# Patient Record
Sex: Female | Born: 1950
Health system: Southern US, Community
[De-identification: ages and names within clinical notes are randomized; demographics above are authoritative.]

## PROBLEM LIST (undated history)

## (undated) DIAGNOSIS — N289 Disorder of kidney and ureter, unspecified: Secondary | ICD-10-CM

## (undated) DIAGNOSIS — N184 Chronic kidney disease, stage 4 (severe): Secondary | ICD-10-CM

## (undated) DIAGNOSIS — H269 Unspecified cataract: Secondary | ICD-10-CM

## (undated) DIAGNOSIS — T7840XA Allergy, unspecified, initial encounter: Secondary | ICD-10-CM

## (undated) DIAGNOSIS — Z8619 Personal history of other infectious and parasitic diseases: Secondary | ICD-10-CM

## (undated) DIAGNOSIS — E039 Hypothyroidism, unspecified: Secondary | ICD-10-CM

## (undated) DIAGNOSIS — Z923 Personal history of irradiation: Secondary | ICD-10-CM

## (undated) DIAGNOSIS — Z862 Personal history of diseases of the blood and blood-forming organs and certain disorders involving the immune mechanism: Secondary | ICD-10-CM

## (undated) DIAGNOSIS — J189 Pneumonia, unspecified organism: Secondary | ICD-10-CM

## (undated) DIAGNOSIS — N6019 Diffuse cystic mastopathy of unspecified breast: Secondary | ICD-10-CM

## (undated) DIAGNOSIS — Z9889 Other specified postprocedural states: Secondary | ICD-10-CM

## (undated) DIAGNOSIS — E049 Nontoxic goiter, unspecified: Secondary | ICD-10-CM

## (undated) DIAGNOSIS — E559 Vitamin D deficiency, unspecified: Secondary | ICD-10-CM

## (undated) DIAGNOSIS — C50919 Malignant neoplasm of unspecified site of unspecified female breast: Secondary | ICD-10-CM

## (undated) DIAGNOSIS — E663 Overweight: Secondary | ICD-10-CM

## (undated) DIAGNOSIS — D0510 Intraductal carcinoma in situ of unspecified breast: Secondary | ICD-10-CM

## (undated) DIAGNOSIS — C801 Malignant (primary) neoplasm, unspecified: Secondary | ICD-10-CM

## (undated) DIAGNOSIS — E782 Mixed hyperlipidemia: Principal | ICD-10-CM

## (undated) HISTORY — DX: Personal history of diseases of the blood and blood-forming organs and certain disorders involving the immune mechanism: Z86.2

## (undated) HISTORY — DX: Personal history of other infectious and parasitic diseases: Z86.19

## (undated) HISTORY — DX: Malignant (primary) neoplasm, unspecified: C80.1

## (undated) HISTORY — PX: EYE SURGERY: SHX253

## (undated) HISTORY — DX: Pneumonia, unspecified organism: J18.9

## (undated) HISTORY — DX: Diffuse cystic mastopathy of unspecified breast: N60.19

## (undated) HISTORY — DX: Vitamin D deficiency, unspecified: E55.9

## (undated) HISTORY — DX: Mixed hyperlipidemia: E78.2

## (undated) HISTORY — DX: Intraductal carcinoma in situ of unspecified breast: D05.10

## (undated) HISTORY — DX: Allergy, unspecified, initial encounter: T78.40XA

## (undated) HISTORY — PX: LAPAROSCOPIC GASTRIC SLEEVE RESECTION: SHX5895

## (undated) HISTORY — DX: Hypothyroidism, unspecified: E03.9

## (undated) HISTORY — PX: BREAST LUMPECTOMY: SHX2

## (undated) HISTORY — DX: Overweight: E66.3

## (undated) HISTORY — PX: CATARACT EXTRACTION: SUR2

## (undated) HISTORY — DX: Nontoxic goiter, unspecified: E04.9

## (undated) HISTORY — DX: Disorder of kidney and ureter, unspecified: N28.9

## (undated) HISTORY — DX: Other specified postprocedural states: Z98.890

## (undated) HISTORY — DX: Unspecified cataract: H26.9

---

## 1996-08-28 LAB — HM DEXA SCAN: HM DEXA SCAN: NORMAL

## 2010-08-28 DIAGNOSIS — J189 Pneumonia, unspecified organism: Secondary | ICD-10-CM

## 2010-08-28 HISTORY — DX: Pneumonia, unspecified organism: J18.9

## 2012-06-06 LAB — BASIC METABOLIC PANEL
BUN: 33 mg/dL — AB (ref 4–21)
Creatinine: 1.9 mg/dL — AB (ref 0.5–1.1)

## 2013-10-26 DIAGNOSIS — D0510 Intraductal carcinoma in situ of unspecified breast: Secondary | ICD-10-CM | POA: Insufficient documentation

## 2013-10-26 HISTORY — DX: Intraductal carcinoma in situ of unspecified breast: D05.10

## 2014-11-27 LAB — HM PAP SMEAR: HM PAP: NORMAL

## 2014-11-27 LAB — HM MAMMOGRAPHY: HM Mammogram: NORMAL

## 2015-03-22 ENCOUNTER — Encounter: Payer: Self-pay | Admitting: Behavioral Health

## 2015-03-22 ENCOUNTER — Telehealth: Payer: Self-pay | Admitting: Behavioral Health

## 2015-03-22 NOTE — Telephone Encounter (Signed)
Pre-Visit Call completed with patient and chart updated.   Pre-Visit Info documented in Specialty Comments under [employer].    

## 2015-03-23 ENCOUNTER — Encounter: Payer: Self-pay | Admitting: Family Medicine

## 2015-03-23 ENCOUNTER — Ambulatory Visit (INDEPENDENT_AMBULATORY_CARE_PROVIDER_SITE_OTHER): Payer: PRIVATE HEALTH INSURANCE | Admitting: Family Medicine

## 2015-03-23 VITALS — BP 108/62 | HR 61 | Temp 98.4°F | Ht 67.0 in | Wt 192.5 lb

## 2015-03-23 DIAGNOSIS — E782 Mixed hyperlipidemia: Secondary | ICD-10-CM | POA: Diagnosis not present

## 2015-03-23 DIAGNOSIS — N289 Disorder of kidney and ureter, unspecified: Secondary | ICD-10-CM

## 2015-03-23 DIAGNOSIS — E039 Hypothyroidism, unspecified: Secondary | ICD-10-CM

## 2015-03-23 DIAGNOSIS — T7840XA Allergy, unspecified, initial encounter: Secondary | ICD-10-CM | POA: Diagnosis not present

## 2015-03-23 DIAGNOSIS — E669 Obesity, unspecified: Secondary | ICD-10-CM | POA: Insufficient documentation

## 2015-03-23 DIAGNOSIS — Z862 Personal history of diseases of the blood and blood-forming organs and certain disorders involving the immune mechanism: Secondary | ICD-10-CM

## 2015-03-23 DIAGNOSIS — Z23 Encounter for immunization: Secondary | ICD-10-CM | POA: Diagnosis not present

## 2015-03-23 DIAGNOSIS — Z8619 Personal history of other infectious and parasitic diseases: Secondary | ICD-10-CM

## 2015-03-23 DIAGNOSIS — Z9889 Other specified postprocedural states: Secondary | ICD-10-CM

## 2015-03-23 DIAGNOSIS — E663 Overweight: Secondary | ICD-10-CM

## 2015-03-23 DIAGNOSIS — D0511 Intraductal carcinoma in situ of right breast: Secondary | ICD-10-CM

## 2015-03-23 HISTORY — DX: Personal history of other infectious and parasitic diseases: Z86.19

## 2015-03-23 HISTORY — DX: Personal history of diseases of the blood and blood-forming organs and certain disorders involving the immune mechanism: Z86.2

## 2015-03-23 HISTORY — DX: Disorder of kidney and ureter, unspecified: N28.9

## 2015-03-23 HISTORY — DX: Other specified postprocedural states: Z98.890

## 2015-03-23 HISTORY — DX: Allergy, unspecified, initial encounter: T78.40XA

## 2015-03-23 HISTORY — DX: Overweight: E66.3

## 2015-03-23 HISTORY — DX: Hypothyroidism, unspecified: E03.9

## 2015-03-23 HISTORY — DX: Mixed hyperlipidemia: E78.2

## 2015-03-23 LAB — COMPREHENSIVE METABOLIC PANEL
ALK PHOS: 72 U/L (ref 39–117)
ALT: 22 U/L (ref 0–35)
AST: 26 U/L (ref 0–37)
Albumin: 3.9 g/dL (ref 3.5–5.2)
BILIRUBIN TOTAL: 0.7 mg/dL (ref 0.2–1.2)
BUN: 36 mg/dL — ABNORMAL HIGH (ref 6–23)
CALCIUM: 9.6 mg/dL (ref 8.4–10.5)
CO2: 27 meq/L (ref 19–32)
Chloride: 108 mEq/L (ref 96–112)
Creatinine, Ser: 1.46 mg/dL — ABNORMAL HIGH (ref 0.40–1.20)
GFR: 46.53 mL/min — AB (ref >60.00)
Glucose, Bld: 89 mg/dL (ref 70–99)
Potassium: 4.2 mEq/L (ref 3.5–5.1)
Sodium: 140 mEq/L (ref 135–145)
TOTAL PROTEIN: 7.8 g/dL (ref 6.0–8.3)

## 2015-03-23 LAB — CBC
HCT: 37.5 % (ref 36.0–46.0)
HEMOGLOBIN: 12.3 g/dL (ref 12.0–15.0)
MCHC: 32.8 g/dL (ref 30.0–36.0)
MCV: 85 fl (ref 78.0–100.0)
Platelets: 166 10*3/uL (ref 150.0–400.0)
RBC: 4.42 Mil/uL (ref 3.87–5.11)
RDW: 14.1 % (ref 11.5–15.5)
WBC: 3.6 10*3/uL — AB (ref 4.0–10.5)

## 2015-03-23 LAB — LIPID PANEL
CHOLESTEROL: 232 mg/dL — AB (ref 0–200)
HDL: 108.4 mg/dL (ref >39.00)
LDL CALC: 117 mg/dL — AB (ref 0–99)
NonHDL: 123.6
TRIGLYCERIDES: 32 mg/dL (ref 0.0–149.0)
Total CHOL/HDL Ratio: 2
VLDL: 6.4 mg/dL (ref 0.0–40.0)

## 2015-03-23 LAB — TSH: TSH: 0.16 u[IU]/mL — AB (ref 0.35–4.50)

## 2015-03-23 NOTE — Assessment & Plan Note (Signed)
Had a bad episode of pneumonia in 2013 and developed kidney dysfunction at that time

## 2015-03-23 NOTE — Assessment & Plan Note (Signed)
Seasonal, worse in childhood. No trouble with allergies in [place] thus far.

## 2015-03-23 NOTE — Assessment & Plan Note (Signed)
Encouraged heart healthy diet, increase exercise, avoid trans fats, consider a krill oil cap daily 

## 2015-03-23 NOTE — Assessment & Plan Note (Signed)
Given Zostavax today

## 2015-03-23 NOTE — Assessment & Plan Note (Signed)
On Levothyroxine, continue to monitor. Check labs today

## 2015-03-23 NOTE — Assessment & Plan Note (Signed)
Encouraged DASH diet, decrease po intake and increase exercise as tolerated. Needs 7-8 hours of sleep nightly. Avoid trans fats, eat small, frequent meals every 4-5 hours with lean proteins, complex carbs and healthy fats. Minimize simple carbs 

## 2015-03-23 NOTE — Patient Instructions (Signed)
Dr Lorenda Cahill, DDS  Preventive Care for Adults A healthy lifestyle and preventive care can promote health and wellness. Preventive health guidelines for women include the following key practices.  A routine yearly physical is a good way to check with your health care provider about your health and preventive screening. It is a chance to share any concerns and updates on your health and to receive a thorough exam.  Visit your dentist for a routine exam and preventive care every 6 months. Brush your teeth twice a day and floss once a day. Good oral hygiene prevents tooth decay and gum disease.  The frequency of eye exams is based on your age, health, family medical history, use of contact lenses, and other factors. Follow your health care provider's recommendations for frequency of eye exams.  Eat a healthy diet. Foods like vegetables, fruits, whole grains, low-fat dairy products, and lean protein foods contain the nutrients you need without too many calories. Decrease your intake of foods high in solid fats, added sugars, and salt. Eat the right amount of calories for you.Get information about a proper diet from your health care provider, if necessary.  Regular physical exercise is one of the most important things you can do for your health. Most adults should get at least 150 minutes of moderate-intensity exercise (any activity that increases your heart rate and causes you to sweat) each week. In addition, most adults need muscle-strengthening exercises on 2 or more days a week.  Maintain a healthy weight. The body mass index (BMI) is a screening tool to identify possible weight problems. It provides an estimate of body fat based on height and weight. Your health care provider can find your BMI and can help you achieve or maintain a healthy weight.For adults 20 years and older:  A BMI below 18.5 is considered underweight.  A BMI of 18.5 to 24.9 is normal.  A BMI of 25 to 29.9 is considered  overweight.  A BMI of 30 and above is considered obese.  Maintain normal blood lipids and cholesterol levels by exercising and minimizing your intake of saturated fat. Eat a balanced diet with plenty of fruit and vegetables. Blood tests for lipids and cholesterol should begin at age 1 and be repeated every 5 years. If your lipid or cholesterol levels are high, you are over 50, or you are at high risk for heart disease, you may need your cholesterol levels checked more frequently.Ongoing high lipid and cholesterol levels should be treated with medicines if diet and exercise are not working.  If you smoke, find out from your health care provider how to quit. If you do not use tobacco, do not start.  Lung cancer screening is recommended for adults aged 16-80 years who are at high risk for developing lung cancer because of a history of smoking. A yearly low-dose CT scan of the lungs is recommended for people who have at least a 30-pack-year history of smoking and are a current smoker or have quit within the past 15 years. A pack year of smoking is smoking an average of 1 pack of cigarettes a day for 1 year (for example: 1 pack a day for 30 years or 2 packs a day for 15 years). Yearly screening should continue until the smoker has stopped smoking for at least 15 years. Yearly screening should be stopped for people who develop a health problem that would prevent them from having lung cancer treatment.  If you are pregnant, do not drink  alcohol. If you are breastfeeding, be very cautious about drinking alcohol. If you are not pregnant and choose to drink alcohol, do not have more than 1 drink per day. One drink is considered to be 12 ounces (355 mL) of beer, 5 ounces (148 mL) of wine, or 1.5 ounces (44 mL) of liquor.  Avoid use of street drugs. Do not share needles with anyone. Ask for help if you need support or instructions about stopping the use of drugs.  High blood pressure causes heart disease and  increases the risk of stroke. Your blood pressure should be checked at least every 1 to 2 years. Ongoing high blood pressure should be treated with medicines if weight loss and exercise do not work.  If you are 18-29 years old, ask your health care provider if you should take aspirin to prevent strokes.  Diabetes screening involves taking a blood sample to check your fasting blood sugar level. This should be done once every 3 years, after age 76, if you are within normal weight and without risk factors for diabetes. Testing should be considered at a younger age or be carried out more frequently if you are overweight and have at least 1 risk factor for diabetes.  Breast cancer screening is essential preventive care for women. You should practice "breast self-awareness." This means understanding the normal appearance and feel of your breasts and may include breast self-examination. Any changes detected, no matter how small, should be reported to a health care provider. Women in their 65s and 30s should have a clinical breast exam (CBE) by a health care provider as part of a regular health exam every 1 to 3 years. After age 69, women should have a CBE every year. Starting at age 50, women should consider having a mammogram (breast X-ray test) every year. Women who have a family history of breast cancer should talk to their health care provider about genetic screening. Women at a high risk of breast cancer should talk to their health care providers about having an MRI and a mammogram every year.  Breast cancer gene (BRCA)-related cancer risk assessment is recommended for women who have family members with BRCA-related cancers. BRCA-related cancers include breast, ovarian, tubal, and peritoneal cancers. Having family members with these cancers may be associated with an increased risk for harmful changes (mutations) in the breast cancer genes BRCA1 and BRCA2. Results of the assessment will determine the need for  genetic counseling and BRCA1 and BRCA2 testing.  Routine pelvic exams to screen for cancer are no longer recommended for nonpregnant women who are considered low risk for cancer of the pelvic organs (ovaries, uterus, and vagina) and who do not have symptoms. Ask your health care provider if a screening pelvic exam is right for you.  If you have had past treatment for cervical cancer or a condition that could lead to cancer, you need Pap tests and screening for cancer for at least 20 years after your treatment. If Pap tests have been discontinued, your risk factors (such as having a new sexual partner) need to be reassessed to determine if screening should be resumed. Some women have medical problems that increase the chance of getting cervical cancer. In these cases, your health care provider may recommend more frequent screening and Pap tests.  The HPV test is an additional test that may be used for cervical cancer screening. The HPV test looks for the virus that can cause the cell changes on the cervix. The cells collected during  the Pap test can be tested for HPV. The HPV test could be used to screen women aged 29 years and older, and should be used in women of any age who have unclear Pap test results. After the age of 54, women should have HPV testing at the same frequency as a Pap test.  Colorectal cancer can be detected and often prevented. Most routine colorectal cancer screening begins at the age of 91 years and continues through age 65 years. However, your health care provider may recommend screening at an earlier age if you have risk factors for colon cancer. On a yearly basis, your health care provider may provide home test kits to check for hidden blood in the stool. Use of a small camera at the end of a tube, to directly examine the colon (sigmoidoscopy or colonoscopy), can detect the earliest forms of colorectal cancer. Talk to your health care provider about this at age 58, when routine  screening begins. Direct exam of the colon should be repeated every 5-10 years through age 61 years, unless early forms of pre-cancerous polyps or small growths are found.  People who are at an increased risk for hepatitis B should be screened for this virus. You are considered at high risk for hepatitis B if:  You were born in a country where hepatitis B occurs often. Talk with your health care provider about which countries are considered high risk.  Your parents were born in a high-risk country and you have not received a shot to protect against hepatitis B (hepatitis B vaccine).  You have HIV or AIDS.  You use needles to inject street drugs.  You live with, or have sex with, someone who has hepatitis B.  You get hemodialysis treatment.  You take certain medicines for conditions like cancer, organ transplantation, and autoimmune conditions.  Hepatitis C blood testing is recommended for all people born from [birth year] through 1965 and any individual with known risks for hepatitis C.  Practice safe sex. Use condoms and avoid high-risk sexual practices to reduce the spread of sexually transmitted infections (STIs). STIs include gonorrhea, chlamydia, syphilis, trichomonas, herpes, HPV, and human immunodeficiency virus (HIV). Herpes, HIV, and HPV are viral illnesses that have no cure. They can result in disability, cancer, and death.  You should be screened for sexually transmitted illnesses (STIs) including gonorrhea and chlamydia if:  You are sexually active and are younger than 24 years.  You are older than 24 years and your health care provider tells you that you are at risk for this type of infection.  Your sexual activity has changed since you were last screened and you are at an increased risk for chlamydia or gonorrhea. Ask your health care provider if you are at risk.  If you are at risk of being infected with HIV, it is recommended that you take a prescription medicine daily to  prevent HIV infection. This is called preexposure prophylaxis (PrEP). You are considered at risk if:  You are a heterosexual woman, are sexually active, and are at increased risk for HIV infection.  You take drugs by injection.  You are sexually active with a partner who has HIV.  Talk with your health care provider about whether you are at high risk of being infected with HIV. If you choose to begin PrEP, you should first be tested for HIV. You should then be tested every 3 months for as long as you are taking PrEP.  Osteoporosis is a disease in which the  bones lose minerals and strength with aging. This can result in serious bone fractures or breaks. The risk of osteoporosis can be identified using a bone density scan. Women ages 29 years and over and women at risk for fractures or osteoporosis should discuss screening with their health care providers. Ask your health care provider whether you should take a calcium supplement or vitamin D to reduce the rate of osteoporosis.  Menopause can be associated with physical symptoms and risks. Hormone replacement therapy is available to decrease symptoms and risks. You should talk to your health care provider about whether hormone replacement therapy is right for you.  Use sunscreen. Apply sunscreen liberally and repeatedly throughout the day. You should seek shade when your shadow is shorter than you. Protect yourself by wearing long sleeves, pants, a wide-brimmed hat, and sunglasses year round, whenever you are outdoors.  Once a month, do a whole body skin exam, using a mirror to look at the skin on your back. Tell your health care provider of new moles, moles that have irregular borders, moles that are larger than a pencil eraser, or moles that have changed in shape or color.  Stay current with required vaccines (immunizations).  Influenza vaccine. All adults should be immunized every year.  Tetanus, diphtheria, and acellular pertussis (Td, Tdap)  vaccine. Pregnant women should receive 1 dose of Tdap vaccine during each pregnancy. The dose should be obtained regardless of the length of time since the last dose. Immunization is preferred during the 27th-36th week of gestation. An adult who has not previously received Tdap or who does not know her vaccine status should receive 1 dose of Tdap. This initial dose should be followed by tetanus and diphtheria toxoids (Td) booster doses every 10 years. Adults with an unknown or incomplete history of completing a 3-dose immunization series with Td-containing vaccines should begin or complete a primary immunization series including a Tdap dose. Adults should receive a Td booster every 10 years.  Varicella vaccine. An adult without evidence of immunity to varicella should receive 2 doses or a second dose if she has previously received 1 dose. Pregnant females who do not have evidence of immunity should receive the first dose after pregnancy. This first dose should be obtained before leaving the health care facility. The second dose should be obtained 4-8 weeks after the first dose.  Human papillomavirus (HPV) vaccine. Females aged 13-26 years who have not received the vaccine previously should obtain the 3-dose series. The vaccine is not recommended for use in pregnant females. However, pregnancy testing is not needed before receiving a dose. If a female is found to be pregnant after receiving a dose, no treatment is needed. In that case, the remaining doses should be delayed until after the pregnancy. Immunization is recommended for any person with an immunocompromised condition through the age of 68 years if she did not get any or all doses earlier. During the 3-dose series, the second dose should be obtained 4-8 weeks after the first dose. The third dose should be obtained 24 weeks after the first dose and 16 weeks after the second dose.  Zoster vaccine. One dose is recommended for adults aged 54 years or older  unless certain conditions are present.  Measles, mumps, and rubella (MMR) vaccine. Adults born before 1 generally are considered immune to measles and mumps. Adults born in [birth year] or later should have 1 or more doses of MMR vaccine unless there is a contraindication to the vaccine or there is  laboratory evidence of immunity to each of the three diseases. A routine second dose of MMR vaccine should be obtained at least 28 days after the first dose for students attending postsecondary schools, health care workers, or international travelers. People who received inactivated measles vaccine or an unknown type of measles vaccine during 1963-1967 should receive 2 doses of MMR vaccine. People who received inactivated mumps vaccine or an unknown type of mumps vaccine before 1979 and are at high risk for mumps infection should consider immunization with 2 doses of MMR vaccine. For females of childbearing age, rubella immunity should be determined. If there is no evidence of immunity, females who are not pregnant should be vaccinated. If there is no evidence of immunity, females who are pregnant should delay immunization until after pregnancy. Unvaccinated health care workers born before [birth year] who lack laboratory evidence of measles, mumps, or rubella immunity or laboratory confirmation of disease should consider measles and mumps immunization with 2 doses of MMR vaccine or rubella immunization with 1 dose of MMR vaccine.  Pneumococcal 13-valent conjugate (PCV13) vaccine. When indicated, a person who is uncertain of her immunization history and has no record of immunization should receive the PCV13 vaccine. An adult aged 5 years or older who has certain medical conditions and has not been previously immunized should receive 1 dose of PCV13 vaccine. This PCV13 should be followed with a dose of pneumococcal polysaccharide (PPSV23) vaccine. The PPSV23 vaccine dose should be obtained at least 8 weeks after the dose of PCV13  vaccine. An adult aged 45 years or older who has certain medical conditions and previously received 1 or more doses of PPSV23 vaccine should receive 1 dose of PCV13. The PCV13 vaccine dose should be obtained 1 or more years after the last PPSV23 vaccine dose.  Pneumococcal polysaccharide (PPSV23) vaccine. When PCV13 is also indicated, PCV13 should be obtained first. All adults aged 66 years and older should be immunized. An adult younger than age 57 years who has certain medical conditions should be immunized. Any person who resides in a nursing home or long-term care facility should be immunized. An adult smoker should be immunized. People with an immunocompromised condition and certain other conditions should receive both PCV13 and PPSV23 vaccines. People with human immunodeficiency virus (HIV) infection should be immunized as soon as possible after diagnosis. Immunization during chemotherapy or radiation therapy should be avoided. Routine use of PPSV23 vaccine is not recommended for American Indians, [place] Natives, or people younger than 65 years unless there are medical conditions that require PPSV23 vaccine. When indicated, people who have unknown immunization and have no record of immunization should receive PPSV23 vaccine. One-time revaccination 5 years after the first dose of PPSV23 is recommended for people aged 19-64 years who have chronic kidney failure, nephrotic syndrome, asplenia, or immunocompromised conditions. People who received 1-2 doses of PPSV23 before age 47 years should receive another dose of PPSV23 vaccine at age 82 years or later if at least 5 years have passed since the previous dose. Doses of PPSV23 are not needed for people immunized with PPSV23 at or after age 40 years.  Meningococcal vaccine. Adults with asplenia or persistent complement component deficiencies should receive 2 doses of quadrivalent meningococcal conjugate (MenACWY-D) vaccine. The doses should be obtained at least  2 months apart. Microbiologists working with certain meningococcal bacteria, [place] recruits, people at risk during an outbreak, and people who travel to or live in countries with a high rate of meningitis should be immunized. A first-year  college student up through age 63 years who is living in a residence hall should receive a dose if she did not receive a dose on or after her 16th birthday. Adults who have certain high-risk conditions should receive one or more doses of vaccine.  Hepatitis A vaccine. Adults who wish to be protected from this disease, have certain high-risk conditions, work with hepatitis A-infected animals, work in hepatitis A research labs, or travel to or work in countries with a high rate of hepatitis A should be immunized. Adults who were previously unvaccinated and who anticipate close contact with an international adoptee during the first 60 days after arrival in the [place] from a country with a high rate of hepatitis A should be immunized.  Hepatitis B vaccine. Adults who wish to be protected from this disease, have certain high-risk conditions, may be exposed to blood or other infectious body fluids, are household contacts or sex partners of hepatitis B positive people, are clients or workers in certain care facilities, or travel to or work in countries with a high rate of hepatitis B should be immunized.  Haemophilus influenzae type b (Hib) vaccine. A previously unvaccinated person with asplenia or sickle cell disease or having a scheduled splenectomy should receive 1 dose of Hib vaccine. Regardless of previous immunization, a recipient of a hematopoietic stem cell transplant should receive a 3-dose series 6-12 months after her successful transplant. Hib vaccine is not recommended for adults with HIV infection. Preventive Services / Frequency Ages 61 to 31 years  Blood pressure check.** / Every 1 to 2 years.  Lipid and cholesterol check.** / Every 5 years beginning  at age 48.  Clinical breast exam.** / Every 3 years for women in their 3s and 59s.  BRCA-related cancer risk assessment.** / For women who have family members with a BRCA-related cancer (breast, ovarian, tubal, or peritoneal cancers).  Pap test.** / Every 2 years from ages 26 through 71. Every 3 years starting at age 23 through age 14 or 65 with a history of 3 consecutive normal Pap tests.  HPV screening.** / Every 3 years from ages 37 through ages 54 to 31 with a history of 3 consecutive normal Pap tests.  Hepatitis C blood test.** / For any individual with known risks for hepatitis C.  Skin self-exam. / Monthly.  Influenza vaccine. / Every year.  Tetanus, diphtheria, and acellular pertussis (Tdap, Td) vaccine.** / Consult your health care provider. Pregnant women should receive 1 dose of Tdap vaccine during each pregnancy. 1 dose of Td every 10 years.  Varicella vaccine.** / Consult your health care provider. Pregnant females who do not have evidence of immunity should receive the first dose after pregnancy.  HPV vaccine. / 3 doses over 6 months, if 65 and younger. The vaccine is not recommended for use in pregnant females. However, pregnancy testing is not needed before receiving a dose.  Measles, mumps, rubella (MMR) vaccine.** / You need at least 1 dose of MMR if you were born in 1957 or later. You may also need a 2nd dose. For females of childbearing age, rubella immunity should be determined. If there is no evidence of immunity, females who are not pregnant should be vaccinated. If there is no evidence of immunity, females who are pregnant should delay immunization until after pregnancy.  Pneumococcal 13-valent conjugate (PCV13) vaccine.** / Consult your health care provider.  Pneumococcal polysaccharide (PPSV23) vaccine.** / 1 to 2 doses if you smoke cigarettes or if you have  certain conditions.  Meningococcal vaccine.** / 1 dose if you are age 18 to 4 years and a Occupational psychologist living in a residence hall, or have one of several medical conditions, you need to get vaccinated against meningococcal disease. You may also need additional booster doses.  Hepatitis A vaccine.** / Consult your health care provider.  Hepatitis B vaccine.** / Consult your health care provider.  Haemophilus influenzae type b (Hib) vaccine.** / Consult your health care provider. Ages 51 to 70 years  Blood pressure check.** / Every 1 to 2 years.  Lipid and cholesterol check.** / Every 5 years beginning at age 46 years.  Lung cancer screening. / Every year if you are aged 75-80 years and have a 30-pack-year history of smoking and currently smoke or have quit within the past 15 years. Yearly screening is stopped once you have quit smoking for at least 15 years or develop a health problem that would prevent you from having lung cancer treatment.  Clinical breast exam.** / Every year after age 9 years.  BRCA-related cancer risk assessment.** / For women who have family members with a BRCA-related cancer (breast, ovarian, tubal, or peritoneal cancers).  Mammogram.** / Every year beginning at age 64 years and continuing for as long as you are in good health. Consult with your health care provider.  Pap test.** / Every 3 years starting at age 42 years through age 54 or 62 years with a history of 3 consecutive normal Pap tests.  HPV screening.** / Every 3 years from ages 35 years through ages 50 to 63 years with a history of 3 consecutive normal Pap tests.  Fecal occult blood test (FOBT) of stool. / Every year beginning at age 66 years and continuing until age 14 years. You may not need to do this test if you get a colonoscopy every 10 years.  Flexible sigmoidoscopy or colonoscopy.** / Every 5 years for a flexible sigmoidoscopy or every 10 years for a colonoscopy beginning at age 26 years and continuing until age 81 years.  Hepatitis C blood test.** / For all people born from [birth year]  through 1965 and any individual with known risks for hepatitis C.  Skin self-exam. / Monthly.  Influenza vaccine. / Every year.  Tetanus, diphtheria, and acellular pertussis (Tdap/Td) vaccine.** / Consult your health care provider. Pregnant women should receive 1 dose of Tdap vaccine during each pregnancy. 1 dose of Td every 10 years.  Varicella vaccine.** / Consult your health care provider. Pregnant females who do not have evidence of immunity should receive the first dose after pregnancy.  Zoster vaccine.** / 1 dose for adults aged 59 years or older.  Measles, mumps, rubella (MMR) vaccine.** / You need at least 1 dose of MMR if you were born in 1957 or later. You may also need a 2nd dose. For females of childbearing age, rubella immunity should be determined. If there is no evidence of immunity, females who are not pregnant should be vaccinated. If there is no evidence of immunity, females who are pregnant should delay immunization until after pregnancy.  Pneumococcal 13-valent conjugate (PCV13) vaccine.** / Consult your health care provider.  Pneumococcal polysaccharide (PPSV23) vaccine.** / 1 to 2 doses if you smoke cigarettes or if you have certain conditions.  Meningococcal vaccine.** / Consult your health care provider.  Hepatitis A vaccine.** / Consult your health care provider.  Hepatitis B vaccine.** / Consult your health care provider.  Haemophilus influenzae type b (Hib) vaccine.** / Consult  your health care provider. Ages 14 years and over  Blood pressure check.** / Every 1 to 2 years.  Lipid and cholesterol check.** / Every 5 years beginning at age 28 years.  Lung cancer screening. / Every year if you are aged 66-80 years and have a 30-pack-year history of smoking and currently smoke or have quit within the past 15 years. Yearly screening is stopped once you have quit smoking for at least 15 years or develop a health problem that would prevent you from having lung cancer  treatment.  Clinical breast exam.** / Every year after age 47 years.  BRCA-related cancer risk assessment.** / For women who have family members with a BRCA-related cancer (breast, ovarian, tubal, or peritoneal cancers).  Mammogram.** / Every year beginning at age 90 years and continuing for as long as you are in good health. Consult with your health care provider.  Pap test.** / Every 3 years starting at age 30 years through age 16 or 74 years with 3 consecutive normal Pap tests. Testing can be stopped between 65 and 70 years with 3 consecutive normal Pap tests and no abnormal Pap or HPV tests in the past 10 years.  HPV screening.** / Every 3 years from ages 63 years through ages 65 or 20 years with a history of 3 consecutive normal Pap tests. Testing can be stopped between 65 and 70 years with 3 consecutive normal Pap tests and no abnormal Pap or HPV tests in the past 10 years.  Fecal occult blood test (FOBT) of stool. / Every year beginning at age 49 years and continuing until age 89 years. You may not need to do this test if you get a colonoscopy every 10 years.  Flexible sigmoidoscopy or colonoscopy.** / Every 5 years for a flexible sigmoidoscopy or every 10 years for a colonoscopy beginning at age 24 years and continuing until age 70 years.  Hepatitis C blood test.** / For all people born from [birth year] through 1964 and any individual with known risks for hepatitis C.  Osteoporosis screening.** / A one-time screening for women ages 39 years and over and women at risk for fractures or osteoporosis.  Skin self-exam. / Monthly.  Influenza vaccine. / Every year.  Tetanus, diphtheria, and acellular pertussis (Tdap/Td) vaccine.** / 1 dose of Td every 10 years.  Varicella vaccine.** / Consult your health care provider.  Zoster vaccine.** / 1 dose for adults aged 10 years or older.  Pneumococcal 13-valent conjugate (PCV13) vaccine.** / Consult your health care provider.  Pneumococcal  polysaccharide (PPSV23) vaccine.** / 1 dose for all adults aged 8 years and older.  Meningococcal vaccine.** / Consult your health care provider.  Hepatitis A vaccine.** / Consult your health care provider.  Hepatitis B vaccine.** / Consult your health care provider.  Haemophilus influenzae type b (Hib) vaccine.** / Consult your health care provider. ** Family history and personal history of risk and conditions may change your health care provider's recommendations. Document Released: 10/10/2001 Document Revised: 12/29/2013 Document Reviewed: 01/09/2011 [facility] Patient Information 2015 [place], [place]. This information is not intended to replace advice given to you by your health care provider. Make sure you discuss any questions you have with your health care provider.

## 2015-03-23 NOTE — Assessment & Plan Note (Signed)
Request old records today and referred to oncology for ongoing surveillance, she reports they have performing MGM every 6 months and next one is due in September

## 2015-03-23 NOTE — Progress Notes (Signed)
Stephanie Pierce  [id number] May 04, 1951 03/23/2015      Progress Note-Follow Up  Subjective  Chief Complaint  Chief Complaint  Patient presents with  . Establish Care    HPI  Patient is a 64 y.o. female in today for routine medical care. Patient in today for new patient exam, she has recently moved to the area from [place]. 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 No recent illness. No acute concerns. Has a PMH of Varicella, hyperlipidemia, renal insufficiency since an acute illness and DCIS. Denies CP/palp/SOB/HA/congestion/fevers/GI or GU c/o. Taking meds as prescribed  Past Medical History  Diagnosis Date  . Pneumonia 2012  . DCIS (ductal carcinoma in situ) of breast   . Enlarged thyroid gland   . Breast fibrocystic disorder     Past Surgical History  Procedure Laterality Date  . Cesarean section  1985  . Laparoscopic gastric sleeve resection    . Breast lumpectomy      Family History  Problem Relation Age of Onset  . Hypertension Father   . Heart attack Father   . Diabetes Brother   . Diabetes Daughter   . Hypertension Daughter     History   Social History  . Marital Status: Divorced    Spouse Name: N/A  . Number of Children: N/A  . Years of Education: 15   Occupational History  . RN Case Manager    Social History Main Topics  . Smoking status: Never Smoker   . Smokeless tobacco: Not on file  . Alcohol Use: No  . Drug Use: No  . Sexual Activity: Not on file   Other Topics Concern  . Not on file   Social History Narrative    Current Outpatient Prescriptions on File Prior to Visit  Medication Sig Dispense Refill  . Calcium Citrate-Vitamin D (CALCIUM CITRATE CHEWY BITE) 500-500 MG-UNIT CHEW Chew by mouth daily.    . Cholecalciferol (VITAMIN D3) 10000 UNITS capsule Take 10,000 Units by mouth every other day.    . Cyanocobalamin (B-12) 2500 MCG TABS Take 1 tablet by mouth once a week.    . levothyroxine (SYNTHROID, LEVOTHROID) 125 MCG  tablet Take 125 mcg by mouth daily before breakfast.    . losartan (COZAAR) 100 MG tablet Take 100 mg by mouth daily.    . Multiple Vitamins-Minerals (CENTRUM SILVER ADULT 50+ PO) Take 1 tablet by mouth every other day.     No current facility-administered medications on file prior to visit.    Allergies  Allergen Reactions  . Lisinopril Swelling    Swelling in the throat  . Penicillins Rash   Allergies verified: UTD  Immunization Status: Flu vaccine-- 05/28/2014 Tdap-- Unknown; pt. could not recall the last one. PNA-- 08/28/2010 Shingles-- NA  A/P:  Changes to FH, PSH or Personal Hx: UTD  Pap-- 11/27/14 w/ Dr. Truman Hayward in [place], [place]; normal; pt. reported  MMG-- 11/27/14 at [facility]; normal; pt. reported  Bone Density-- 08/28/96 at [facility]; normal; pt. reported  CCS-- patient has not had this completed.  Care Teams Updated: No providers or specialists to update; pt. recently relocated to [facility] from [place], [place].  Review of Systems  Review of Systems  Constitutional: Negative for fever and malaise/fatigue.  HENT: Negative for congestion.   Eyes: Negative for discharge.  Respiratory: Negative for shortness of breath.   Cardiovascular: Negative for chest pain, palpitations and leg swelling.  Gastrointestinal: Negative for nausea, abdominal pain and diarrhea.  Genitourinary: Negative for dysuria.  Musculoskeletal: Negative for falls.  Skin: Negative for rash.  Neurological: Negative for loss of consciousness and headaches.  Endo/Heme/Allergies: Negative for polydipsia.  Psychiatric/Behavioral: Negative for depression and suicidal ideas. The patient is not nervous/anxious and does not have insomnia.     Objective  BP 108/62 mmHg  Pulse 61  Temp(Src) 98.4 F (36.9 C) (Oral)  Ht '5\' 7"'$  (1.702 m)  Wt 192 lb 8 oz (87.317 kg)  BMI 30.14 kg/m2  SpO2 97%  Physical Exam  Physical Exam  Constitutional: She is oriented to person, place, and time and  well-developed, well-nourished, and in no distress. No distress.  HENT:  Head: Normocephalic and atraumatic.  Eyes: Conjunctivae are normal.  Neck: Neck supple. No thyromegaly present.  Cardiovascular: Normal rate, regular rhythm and normal heart sounds.   No murmur heard. Pulmonary/Chest: Effort normal and breath sounds normal. She has no wheezes.  Abdominal: She exhibits no distension and no mass.  Musculoskeletal: She exhibits no edema.  Lymphadenopathy:    She has no cervical adenopathy.  Neurological: She is alert and oriented to person, place, and time.  Skin: Skin is warm and dry. No rash noted. She is not diaphoretic.  Psychiatric: Memory, affect and judgment normal.    Assessment & Plan  Renal insufficiency Had a bad episode of pneumonia in 2013 and developed kidney dysfunction at that time  Hyperlipidemia, mixed Encouraged heart healthy diet, increase exercise, avoid trans fats, consider a krill oil cap daily  S/P gastric surgery Gastric sleeve 2012. No complications, eats under 1200 kcal daily with protein and veg. And exercise regularly  Allergic state Seasonal, worse in childhood. No trouble with allergies in [place] thus far.  Overweight Encouraged DASH diet, decrease po intake and increase exercise as tolerated. Needs 7-8 hours of sleep nightly. Avoid trans fats, eat small, frequent meals every 4-5 hours with lean proteins, complex carbs and healthy fats. Minimize simple carbs  History of chicken pox Given Zostavax today  Hypothyroidism On Levothyroxine, continue to monitor. Check labs today  DCIS (ductal carcinoma in situ) of breast Request old records today and referred to oncology for ongoing surveillance, she reports they have performing MGM every 6 months and next one is due in September

## 2015-03-23 NOTE — Progress Notes (Signed)
Pre visit review using our clinic review tool, if applicable. No additional management support is needed unless otherwise documented below in the visit note. 

## 2015-03-23 NOTE — Assessment & Plan Note (Signed)
Gastric sleeve 2012. No complications, eats under 1200 kcal daily with protein and veg. And exercise regularly

## 2015-03-24 ENCOUNTER — Telehealth: Payer: Self-pay

## 2015-03-24 ENCOUNTER — Encounter: Payer: Self-pay | Admitting: Family Medicine

## 2015-03-24 NOTE — Telephone Encounter (Signed)
-----   Message from Mosie Lukes, MD sent at 03/23/2015 10:19 PM EDT ----- Notify cholesterol up slightly. Encourage heart healthy diet such as DASH diet, increase exercise, avoid trans fats, consider a krill oil cap daily. Kidney functions off only slightly as patient knows. The TSH is down some. Would recommend she stay on same dose of Levothyroxine but only take it 6 days a week. Recheck TSH in 12 weeks.

## 2015-03-24 NOTE — Telephone Encounter (Signed)
Patient returned your call.

## 2015-03-24 NOTE — Telephone Encounter (Signed)
Pt notified of results verbalized understanding. No question or concerns at this time?

## 2015-03-24 NOTE — Telephone Encounter (Signed)
LMOVM

## 2015-03-24 NOTE — Telephone Encounter (Signed)
Pt states you may leave detailed message on her phone.

## 2015-03-29 ENCOUNTER — Telehealth: Payer: Self-pay

## 2015-03-29 NOTE — Telephone Encounter (Signed)
Pt called back to confirm new pt appt. On 05/12/15 starting at 10:00

## 2015-03-29 NOTE — Telephone Encounter (Signed)
LVM to call back and confirm New pt appt on 05/12/15 starting at 10:00am

## 2015-04-05 ENCOUNTER — Ambulatory Visit (INDEPENDENT_AMBULATORY_CARE_PROVIDER_SITE_OTHER)

## 2015-04-05 ENCOUNTER — Ambulatory Visit (INDEPENDENT_AMBULATORY_CARE_PROVIDER_SITE_OTHER): Payer: PRIVATE HEALTH INSURANCE | Admitting: Family Medicine

## 2015-04-05 VITALS — BP 128/78 | HR 75 | Temp 98.4°F | Resp 18 | Ht 67.0 in | Wt 194.4 lb

## 2015-04-05 DIAGNOSIS — M25552 Pain in left hip: Secondary | ICD-10-CM

## 2015-04-05 DIAGNOSIS — R1030 Lower abdominal pain, unspecified: Secondary | ICD-10-CM | POA: Diagnosis not present

## 2015-04-05 DIAGNOSIS — R1032 Left lower quadrant pain: Secondary | ICD-10-CM

## 2015-04-05 NOTE — Patient Instructions (Signed)
I do not see a definite fracture on your x-ray today, but sometimes subtle fractures do not show up. Use the crutches, and if you are having persistent difficulty with putting weight on your leg later this week, let me know and I will arrange for an MRI or orthopedic evaluation. Return to the clinic or go to the nearest emergency room if any of your symptoms worsen or new symptoms occur.  Over-the-counter Advil or Aleve if needed, call me if he needs something stronger.

## 2015-04-05 NOTE — Progress Notes (Signed)
Subjective:  This chart was scribed for Merri Ray, MD by Thea Alken, ED Scribe. This patient was seen in room 3 and the patient's care was started at 7:54 PM.   Patient ID: Stephanie Pierce, female    DOB: 1951-05-25, 64 y.o.   MRN: [id number]  HPI   Chief Complaint  Patient presents with  . Groin Pain    on left side since yesterday morning-hurts to walk, moving slowly   HPI Comments: Stephanie Pierce is a 64 y.o. female who presents to the [facility] complaining of localized left groin pain since yesterday. Pt states she was walking 2 weeks ago when she  "turned her foot" causing her to trip. Initially she had pain to left groin that reduced after doing some exercises but woke up yesterday morning with worsening pain burning, exacerbated with changing positions, sitting and bearing weight to left leg. She has taken Aleve, tylenol and motrin. Pt is new to [place] and is from [place].    Patient Active Problem List   Diagnosis Date Noted  . Hyperlipidemia, mixed 03/23/2015  . Renal insufficiency 03/23/2015  . S/P gastric surgery 03/23/2015  . Overweight 03/23/2015  . Allergic state 03/23/2015  . History of chicken pox 03/23/2015  . Hypothyroidism 03/23/2015  . H/O iron deficiency anemia 03/23/2015  . 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 Pneumonia 2012  . DCIS (ductal carcinoma in situ) of breast   . Enlarged thyroid gland   . Breast fibrocystic disorder   . Hyperlipidemia, mixed 03/23/2015  . Renal insufficiency 03/23/2015  . S/P gastric surgery 03/23/2015    Gastric sleeve  . Overweight 03/23/2015  . Allergic state 03/23/2015  . History of chicken pox 03/23/2015  . DCIS (ductal carcinoma in situ) of breast March 2015    right  . Hypothyroidism 03/23/2015  . H/O iron deficiency anemia 03/23/2015  . Allergy   . Cancer    Past Surgical History  Procedure Laterality Date  . Cesarean  section  1985  . Laparoscopic gastric sleeve resection    . Breast lumpectomy      h/o 2 needle biopsy, in 2015 right Lumpectomy DCIS stage 0   Allergies  Allergen Reactions  . Lisinopril Swelling    Swelling in the throat  . Penicillins Rash   Prior to Admission medications   Medication Sig Start Date End Date Taking? Authorizing Provider  Calcium Citrate-Vitamin D (CALCIUM CITRATE CHEWY BITE) 500-500 MG-UNIT CHEW Chew by mouth daily.   Yes Historical Provider, MD  Cholecalciferol (VITAMIN D3) 10000 UNITS capsule Take 10,000 Units by mouth every other day.   Yes Historical Provider, MD  Cyanocobalamin (B-12) 2500 MCG TABS Take 1 tablet by mouth once a week.   Yes Historical Provider, MD  ibuprofen (ADVIL,MOTRIN) 200 MG tablet Take 200 mg by mouth every 6 (six) hours as needed.   Yes Historical Provider, MD  KRILL OIL OMEGA-3 PO Take by mouth. Take 2 daily   Yes Historical Provider, MD  levothyroxine (SYNTHROID, LEVOTHROID) 125 MCG tablet Take 125 mcg by mouth daily before breakfast.   Yes Historical Provider, MD  losartan (COZAAR) 100 MG tablet Take 100 mg by mouth daily.   Yes Historical Provider, MD  Multiple Vitamins-Minerals (CENTRUM SILVER ADULT 50+ PO) Take 1 tablet by mouth every other day.   Yes Historical Provider, MD   History   Social History  . Marital Status:  Divorced    Spouse Name: N/A  . Number of Children: N/A  . Years of Education: 108   Occupational History  . RN Case Manager    Social History Main Topics  . Smoking status: Never Smoker   . Smokeless tobacco: Not on file  . Alcohol Use: No  . Drug Use: No  . 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 Not on file   Social History Narrative   Review of Systems  Musculoskeletal: Positive for arthralgias and gait problem.  Skin: Negative for color change, rash and wound.    Neurological: Negative for weakness and numbness.   Objective:   Physical Exam  Constitutional: She is oriented to person, place, and time. She appears well-developed and well-nourished. No distress.  HENT:  Head: Normocephalic and atraumatic.  Eyes: Conjunctivae and EOM are normal.  Neck: Neck supple.  Cardiovascular: Normal rate.   Pulmonary/Chest: Effort normal.  Musculoskeletal: Normal range of motion.  Pain into the inguinal fold of the left leg. guarded exam with difficulty weight bearing on exam. Guarded with any ROM of left hip. tib fib non tender. Calf non tender.left ankle non tender. NVI distally. DP 2+  Neurological: She is alert and oriented to person, place, and time.  Skin: Skin is warm and dry.  Psychiatric: She has a normal mood and affect. Her behavior is normal.  Nursing note and vitals reviewed.  Danley Danker Vitals:   04/05/15 1850  BP: 128/78  Pulse: 75  Temp: 98.4 F (36.9 C)  TempSrc: Oral  Resp: 18  Height: '5\' 7"'$  (1.702 m)  Weight: 194 lb 6 oz (88.168 kg)  SpO2: 99%  [facility] reading (PRIMARY) by Dr. Carlota Raspberry. Left hip- Degenerative joint changes in left hip. no apparent fracture. There are some calcification of the superior acetabulum. And questionable cystic area to left inferior ramus     Assessment & Plan:   Stephanie Pierce is a 64 y.o. female Left hip pain - Plan: DG HIP UNILAT W OR W/O PELVIS 2-3 VIEWS LEFT  Left groin pain - Plan: DG HIP UNILAT W OR W/O PELVIS 2-3 VIEWS LEFT  -Possible initial groin pull/strain few weeks ago then increased discomfort recently. No apparent hip fracture on x-ray.  Did discuss potential hairline or subtle fracture that may not show up on X Ray, so if continued difficulty with weightbearing, would recommend MRI for further eval or orthopedics referral.  -Fitted for crutches, and nonweightbearing until can toe-touch weight-bear without difficulty. Then weight-bear as tolerated only if pain-free.  -Can continue  over-the-counter ibuprofen or Aleve, call if stronger medication needed.  Meds ordered this encounter  Medications  . KRILL OIL OMEGA-3 PO    Sig: Take by mouth. Take 2 daily  . ibuprofen (ADVIL,MOTRIN) 200 MG tablet    Sig: Take 200 mg by mouth every 6 (six) hours as needed.   Patient Instructions  I do not see a definite fracture on your x-ray today, but sometimes subtle fractures do not show up. Use the crutches, and if you are having persistent difficulty with putting weight on your leg later this week, let me know and I will arrange for an MRI or orthopedic evaluation. Return to the clinic or go to the nearest emergency room if any of your symptoms worsen or new symptoms occur.  Over-the-counter Advil  or Aleve if needed, call me if he needs something stronger.    I personally performed the services described in this documentation, which was scribed in my presence. The recorded information has been reviewed and considered, and addended by me as needed.

## 2015-04-08 ENCOUNTER — Ambulatory Visit (INDEPENDENT_AMBULATORY_CARE_PROVIDER_SITE_OTHER): Admitting: Family Medicine

## 2015-04-08 VITALS — BP 132/70 | HR 74 | Temp 98.3°F | Resp 18 | Ht 67.0 in | Wt 194.8 lb

## 2015-04-08 DIAGNOSIS — M25552 Pain in left hip: Secondary | ICD-10-CM | POA: Diagnosis not present

## 2015-04-08 NOTE — Progress Notes (Addendum)
Subjective:    Patient ID: Stephanie Pierce, female    DOB: 08-11-1951, 64 y.o.   MRN: [id number] This chart was scribed for Merri Ray, MD by Marti Sleigh, Medical Scribe. This patient was seen in Room 1 and the patient's care was started a 10:38 AM.  Chief Complaint  Patient presents with  . Follow-up    pt still having pain in growing     HPI HPI Comments: Stephanie Pierce is a 64 y.o. female who presents to [facility] report for follow up for left hip pain. She initially noticed pain two weeks ago when she tured her foot in medially. She was initially able to walk and do some exercises, but pain increased to the point where the day prior to her last visit she was unable to bare any weight on the leg. Initial x-ray was negative for fracture, and she was placed on crutches. Pt states she has been taking tylenol intermittently with aleve but has discontinued tylenol because it does not seem reduce her pain. Pt denies loss of bowel or bladder control. Pt is taking '600mg'$  once per day of Aleve.   Pt is able to bare weight on the hip at this point but she is not able to walk normally, and is still using one of her crutches.    Patient Active Problem List   Diagnosis Date Noted  . Hyperlipidemia, mixed 03/23/2015  . Renal insufficiency 03/23/2015  . S/P gastric surgery 03/23/2015  . Overweight 03/23/2015  . Allergic state 03/23/2015  . History of chicken pox 03/23/2015  . Hypothyroidism 03/23/2015  . H/O iron deficiency anemia 03/23/2015  . 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 Pneumonia 2012  . DCIS (ductal carcinoma in situ) of breast   . Enlarged thyroid gland   . Breast fibrocystic disorder   . Hyperlipidemia, mixed 03/23/2015  . Renal insufficiency 03/23/2015  . S/P gastric surgery 03/23/2015    Gastric sleeve  . Overweight 03/23/2015  . Allergic state 03/23/2015  . History of chicken pox 03/23/2015  . DCIS (ductal  carcinoma in situ) of breast March 2015    right  . Hypothyroidism 03/23/2015  . H/O iron deficiency anemia 03/23/2015  . Allergy   . Cancer    Past Surgical History  Procedure Laterality Date  . Cesarean section  1985  . Laparoscopic gastric sleeve resection    . Breast lumpectomy      h/o 2 needle biopsy, in 2015 right Lumpectomy DCIS stage 0   Allergies  Allergen Reactions  . Lisinopril Swelling    Swelling in the throat  . Penicillins Rash   Prior to Admission medications   Medication Sig Start Date End Date Taking? Authorizing Provider  Calcium Citrate-Vitamin D (CALCIUM CITRATE CHEWY BITE) 500-500 MG-UNIT CHEW Chew by mouth daily.   Yes Historical Provider, MD  Cholecalciferol (VITAMIN D3) 10000 UNITS capsule Take 10,000 Units by mouth every other day.   Yes Historical Provider, MD  Cyanocobalamin (B-12) 2500 MCG TABS Take 1 tablet by mouth once a week.   Yes Historical Provider, MD  KRILL OIL OMEGA-3 PO Take by mouth. Take 2 daily   Yes Historical Provider, MD  levothyroxine (SYNTHROID, LEVOTHROID) 125 MCG tablet Take 125 mcg by mouth daily before breakfast.   Yes Historical Provider, MD  losartan (COZAAR) 100 MG tablet Take 100 mg by mouth daily.   Yes Historical Provider, MD  Multiple Vitamins-Minerals (CENTRUM  SILVER ADULT 50+ PO) Take 1 tablet by mouth every other day.   Yes Historical Provider, MD  ibuprofen (ADVIL,MOTRIN) 200 MG tablet Take 200 mg by mouth every 6 (six) hours as needed.    Historical Provider, MD   Social History   Social History  . Marital Status: Divorced    Spouse Name: N/A  . Number of Children: N/A  . Years of Education: 87   Occupational History  . RN Case Manager    Social History Main Topics  . Smoking status: Never Smoker   . Smokeless tobacco: Not on file  . Alcohol Use: No  . Drug Use: No  . Sexual Activity: Yes     Comment: 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, RN case Manager, no dietary restrictions follows heart healthy diet with 60 to 80 gm  of protein   Other Topics Concern  . Not on file   Social History Narrative    Review of Systems  Constitutional: Negative for fever and chills.  Genitourinary: Negative for dysuria, urgency and frequency.  Musculoskeletal: Positive for gait problem. Negative for joint swelling.  Skin: Negative for color change and wound.  Neurological: Positive for weakness (Secondary to pain). Negative for numbness.       Objective:   Physical Exam  Constitutional: She is oriented to person, place, and time. She appears well-developed and well-nourished. No distress.  HENT:  Head: Normocephalic and atraumatic.  Eyes: Pupils are equal, round, and reactive to light.  Neck: Neck supple.  Cardiovascular: Normal rate.   Pulmonary/Chest: Effort normal. No respiratory distress.  Musculoskeletal: Normal range of motion.  Still slightly guarded with left hip, with antalgic gait. Trochanteric bursa non tender. Some tenderness along proximal hip flexors, and to the inguinal fold. With knee bend guarded at 90 degrees due to pain, guarded with internal and external rotation due to pain.   Neurological: She is alert and oriented to person, place, and time. Coordination normal.  Skin: Skin is warm and dry. She is not diaphoretic.  Psychiatric: She has a normal mood and affect. Her behavior is normal.  Nursing note and vitals reviewed.   Filed Vitals:   04/08/15 0949  BP: 132/70  Pulse: 74  Temp: 98.3 F (36.8 C)  TempSrc: Oral  Resp: 18  Height: '5\' 7"'$  (1.702 m)  Weight: 194 lb 12.8 oz (88.361 kg)  SpO2: 99%       Assessment & Plan:   Stephanie Pierce is a 64 y.o. female Left hip pain - Plan: Ambulatory referral to Orthopedic Surgery  Minimally Improved, but still discomfort with internal/external rotation into hip. No fracture seen on initial x-ray. Still recommended to use crutches with avoiding weight-bear if and in doing so.   - refer to orthopedics, but if pain persists prior to  orthopedic eval, or difficulty with weightbearing persists, recommended MRI to evaluate for cartilage injury versus less likely fracture. Understanding expressed.   No orders of the defined types were placed in this encounter.   Patient Instructions  Although your pain is improved, I'm still concerned about the pain into that left hip including with putting weight on it. I will refer you to orthopedics, use crutches as needed needed and weight-bear only as tolerated if not having pain.  If you're having increased pain prior to orthopedics, I can order an MRI as we discussed to rule out a tear in the cartilage of that hip or less likely a fracture as we also discussed. Return to the clinic or  go to the nearest emergency room if any of your symptoms worsen or new symptoms occur.      I personally performed the services described in this documentation, which was scribed in my presence. The recorded information has been reviewed and considered, and addended by me as needed.

## 2015-04-08 NOTE — Patient Instructions (Signed)
Although your pain is improved, I'm still concerned about the pain into that left hip including with putting weight on it. I will refer you to orthopedics, use crutches as needed needed and weight-bear only as tolerated if not having pain.  If you're having increased pain prior to orthopedics, I can order an MRI as we discussed to rule out a tear in the cartilage of that hip or less likely a fracture as we also discussed. Return to the clinic or go to the nearest emergency room if any of your symptoms worsen or new symptoms occur.

## 2015-04-13 ENCOUNTER — Ambulatory Visit: Payer: PRIVATE HEALTH INSURANCE | Admitting: Family Medicine

## 2015-05-04 ENCOUNTER — Other Ambulatory Visit: Payer: PRIVATE HEALTH INSURANCE

## 2015-05-04 ENCOUNTER — Ambulatory Visit: Payer: PRIVATE HEALTH INSURANCE | Admitting: Hematology & Oncology

## 2015-05-04 ENCOUNTER — Ambulatory Visit: Payer: PRIVATE HEALTH INSURANCE

## 2015-05-12 ENCOUNTER — Encounter: Payer: Self-pay | Admitting: Hematology & Oncology

## 2015-05-12 ENCOUNTER — Other Ambulatory Visit (HOSPITAL_BASED_OUTPATIENT_CLINIC_OR_DEPARTMENT_OTHER): Payer: PRIVATE HEALTH INSURANCE

## 2015-05-12 ENCOUNTER — Ambulatory Visit: Payer: PRIVATE HEALTH INSURANCE

## 2015-05-12 ENCOUNTER — Ambulatory Visit (HOSPITAL_BASED_OUTPATIENT_CLINIC_OR_DEPARTMENT_OTHER): Payer: PRIVATE HEALTH INSURANCE | Admitting: Hematology & Oncology

## 2015-05-12 VITALS — BP 129/79 | HR 77 | Temp 97.8°F | Resp 16 | Ht 67.0 in | Wt 201.0 lb

## 2015-05-12 DIAGNOSIS — D0511 Intraductal carcinoma in situ of right breast: Secondary | ICD-10-CM

## 2015-05-12 DIAGNOSIS — E559 Vitamin D deficiency, unspecified: Secondary | ICD-10-CM | POA: Insufficient documentation

## 2015-05-12 HISTORY — DX: Vitamin D deficiency, unspecified: E55.9

## 2015-05-12 LAB — COMPREHENSIVE METABOLIC PANEL (CC13)
ALBUMIN: 3.8 g/dL (ref 3.5–5.0)
ALK PHOS: 79 U/L (ref 40–150)
ALT: 23 U/L (ref 0–55)
AST: 26 U/L (ref 5–34)
Anion Gap: 7 mEq/L (ref 3–11)
BILIRUBIN TOTAL: 0.58 mg/dL (ref 0.20–1.20)
BUN: 35.8 mg/dL — AB (ref 7.0–26.0)
CO2: 28 meq/L (ref 22–29)
CREATININE: 1.7 mg/dL — AB (ref 0.6–1.1)
Calcium: 9.9 mg/dL (ref 8.4–10.4)
Chloride: 107 mEq/L (ref 98–109)
EGFR: 37 mL/min/{1.73_m2} — ABNORMAL LOW (ref >90)
GLUCOSE: 86 mg/dL (ref 70–140)
Potassium: 4.5 mEq/L (ref 3.5–5.1)
SODIUM: 142 meq/L (ref 136–145)
TOTAL PROTEIN: 7.9 g/dL (ref 6.4–8.3)

## 2015-05-12 LAB — CBC WITH DIFFERENTIAL (CANCER CENTER ONLY)
BASO#: 0 10*3/uL (ref 0.0–0.2)
BASO%: 0.5 % (ref 0.0–2.0)
EOS ABS: 0.2 10*3/uL (ref 0.0–0.5)
EOS%: 4.1 % (ref 0.0–7.0)
HCT: 37.3 % (ref 34.8–46.6)
HEMOGLOBIN: 12.2 g/dL (ref 11.6–15.9)
LYMPH#: 1.1 10*3/uL (ref 0.9–3.3)
LYMPH%: 29 % (ref 14.0–48.0)
MCH: 28.4 pg (ref 26.0–34.0)
MCHC: 32.7 g/dL (ref 32.0–36.0)
MCV: 87 fL (ref 81–101)
MONO#: 0.5 10*3/uL (ref 0.1–0.9)
MONO%: 11.5 % (ref 0.0–13.0)
NEUT%: 54.9 % (ref 39.6–80.0)
NEUTROS ABS: 2.1 10*3/uL (ref 1.5–6.5)
PLATELETS: 164 10*3/uL (ref 145–400)
RBC: 4.29 10*6/uL (ref 3.70–5.32)
RDW: 13.8 % (ref 11.1–15.7)
WBC: 3.9 10*3/uL (ref 3.9–10.0)

## 2015-05-12 NOTE — Progress Notes (Signed)
Referral MD  Reason for Referral: DCIS of the right breast   Chief Complaint  Patient presents with  . OTHER    New Patient  : I just moved here from [place].   HPI: Stephanie Pierce is a very charming 64 year old African-American female. She was in [employer] for 18 years. She was in [place]. Her boyfriend lives up here in [facility]. As such, she has moved up  She was found to have DCIS in the right breast back in July 2015. This was done down in [place]. She had a lumpectomy. The pathology report ([id number]) showed a ductal carcinoma in situ. This measured 4 mm.  She then underwent brachytherapy. She had this for 5 days.  It was elected not to put her on any type of hormonal therapy.  She has been getting mammograms every 6 months.  She would like to have some restrictive surgery from the lumpectomy. We will see about making a referral for her.  She has had a gastric sleeve. This was done in 2012.  She does not smoke. She does not drink. She's gained a little bit of weight.  She's had no process with how flashes and sweats.  She does not have any history of sickle cell.  There is no history in the family of breast cancer or other malignancy.    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 She moved around quite a bit.  She has one daughter who is 64 years old. She is going to law school down in [place].  She's had no issues with leg swelling. She's had no rashes.  She's had no cough or shortness of breath.  Overall, her performance status is ECOG 0.     Past Medical History  Diagnosis Date  . Pneumonia 2012  . DCIS (ductal carcinoma in situ) of breast   . Enlarged thyroid gland   . Breast fibrocystic disorder   . Hyperlipidemia, mixed 03/23/2015  . Renal insufficiency 03/23/2015  . S/P gastric surgery 03/23/2015    Gastric sleeve  . Overweight 03/23/2015  . Allergic state 03/23/2015  . History of chicken pox 03/23/2015  . DCIS (ductal carcinoma in situ) of breast March 2015     right  . Hypothyroidism 03/23/2015  . H/O iron deficiency anemia 03/23/2015  . Allergy   . Cancer   . Vitamin D deficiency 05/12/2015  :  Past Surgical History  Procedure Laterality Date  . Cesarean section  1985  . Laparoscopic gastric sleeve resection    . Breast lumpectomy      h/o 2 needle biopsy, in 2015 right Lumpectomy DCIS stage 0  :   Current outpatient prescriptions:  .  Calcium Citrate-Vitamin D (CALCIUM CITRATE CHEWY BITE) 500-500 MG-UNIT CHEW, Chew by mouth daily., Disp: , Rfl:  .  Cholecalciferol (VITAMIN D3) 10000 UNITS capsule, Take 10,000 Units by mouth every other day., Disp: , Rfl:  .  Cyanocobalamin (B-12) 2500 MCG TABS, Take 1 tablet by mouth once a week., Disp: , Rfl:  .  ibuprofen (ADVIL,MOTRIN) 200 MG tablet, Take 200 mg by mouth every 6 (six) hours as needed., Disp: , Rfl:  .  KRILL OIL OMEGA-3 PO, Take by mouth. Take 2 daily, Disp: , Rfl:  .  levothyroxine (SYNTHROID, LEVOTHROID) 125 MCG tablet, Take 125 mcg by mouth daily before breakfast., Disp: , Rfl:  .  losartan (COZAAR) 100 MG tablet, Take 100 mg by mouth daily., Disp: , Rfl:  .  Multiple  Vitamins-Minerals (CENTRUM SILVER ADULT 50+ PO), Take 1 tablet by mouth every other day., Disp: , Rfl: :  :  Allergies  Allergen Reactions  . Lisinopril Swelling    Swelling in the throat  . Penicillins Rash  :  Family History  Problem Relation Age of Onset  . Hypertension Father   . Heart attack Father   . Alcohol abuse Father   . Diabetes Brother   . Other Brother     complications of exposure to agent orange  . Stroke Brother   . Diverticulosis Daughter   . Diabetes Sister   . Hypertension Sister   :  Social History   Social History  . Marital Status: Divorced    Spouse Name: N/A  . Number of Children: N/A  . Years of Education: 65   Occupational History  . RN Case Manager    Social History Main Topics  . Smoking status: Never Smoker   . Smokeless tobacco: Not on file  . Alcohol Use:  No  . Drug Use: No  . 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 Not on file   Social History Narrative  :  Pertinent items are noted in HPI.  Exam: '@IPVITALS'$ @  well-developed and well-nourished [place] American female. Her head and neck exam shows no ocular or oral lesions. There are no palpable cervical or supraclavicular lymph nodes. Lungs are clear bilaterally. Cardiac exam regular rate and rhythm with no murmurs, rubs or bruits. Breast exam shows left breast with no masses, edema or erythema. There is no mass in the left breast. There is no left axillary adenopathy. Right breast shows some slight contraction. She has a lumpectomy has well-healed at about the 7:00 position. There is some slight firmness at the lumpectomy site. There is some slight hyperpigmentation at the lumpectomy site. No right axillary adenopathy is noted. Abdomen is soft. She has good bowel sounds. There is no fluid wave. There is no palpable liver or spleen tip. Back exam shows no tenderness over the spine, ribs or hips. Neurological exam shows no focal neurological deficits. Skin exam shows no rashes, ecchymoses or petechia.    Recent Labs  05/12/15 1004  WBC 3.9  HGB 12.2  HCT 37.3  PLT 164   No results for input(s): NA, K, CL, CO2, GLUCOSE, BUN, CREATININE, CALCIUM in the last 72 hours.  Blood smear review:  None  Pathology: See above     Assessment and Plan:  Stephanie Pierce is a very charming 64 year old African American female. She has a DCIS of the right breast. She had this excised. She had radiation for this.  We will go ahead and plan for the mammogram. I will like to think that she should be on have yearly mammograms after this one.  We'll see about making a referral to Dr. Theodoro Kos of reconstructive surgery so that she can have her breast made more  symmetrical.  I will plan to see her back myself in 6 months.  She is on vitamin D. We will check her levels.  I spent about 45 minutes with her. She is very very nice.  I thanked her for serving our country.

## 2015-05-13 ENCOUNTER — Encounter: Payer: Self-pay | Admitting: *Deleted

## 2015-05-13 LAB — VITAMIN D 25 HYDROXY (VIT D DEFICIENCY, FRACTURES): VIT D 25 HYDROXY: 37 ng/mL (ref 30–100)

## 2015-05-19 ENCOUNTER — Other Ambulatory Visit: Payer: Self-pay | Admitting: Hematology & Oncology

## 2015-05-19 ENCOUNTER — Ambulatory Visit
Admission: RE | Admit: 2015-05-19 | Discharge: 2015-05-19 | Disposition: A | Discharge status: Home / Self Care | Payer: PRIVATE HEALTH INSURANCE | Source: Ambulatory Visit | Attending: Hematology & Oncology | Admitting: Hematology & Oncology

## 2015-05-19 DIAGNOSIS — D0511 Intraductal carcinoma in situ of right breast: Secondary | ICD-10-CM

## 2015-05-26 ENCOUNTER — Ambulatory Visit
Admission: RE | Admit: 2015-05-26 | Discharge: 2015-05-26 | Disposition: A | Discharge status: Home / Self Care | Payer: PRIVATE HEALTH INSURANCE | Source: Ambulatory Visit | Attending: Hematology & Oncology | Admitting: Hematology & Oncology

## 2015-06-07 ENCOUNTER — Telehealth: Payer: Self-pay | Admitting: Family Medicine

## 2015-06-07 LAB — COLOGUARD: COLOGUARD: NEGATIVE

## 2015-06-07 NOTE — Telephone Encounter (Signed)
Pt has 10 days of SYNTHROID on hand. She needs BRAND ONLY. She wants it thru the [employer]. Please send in for her.

## 2015-06-08 MED ORDER — LEVOTHYROXINE SODIUM 125 MCG PO TABS: 125.0000 ug | ORAL_TABLET | Freq: Every day | ORAL | Status: DC

## 2015-06-08 NOTE — Telephone Encounter (Signed)
Sent in prescription as requested and called the patient to inform

## 2015-06-15 LAB — FECAL OCCULT BLOOD, GUAIAC: FECAL OCCULT BLD: NEGATIVE

## 2015-06-25 ENCOUNTER — Telehealth: Payer: Self-pay | Admitting: Family Medicine

## 2015-06-25 ENCOUNTER — Encounter: Payer: Self-pay | Admitting: Family Medicine

## 2015-06-25 NOTE — Telephone Encounter (Signed)
Called the patient left a detailed message of her Cologuard Result that was Negative.  Made a copy and mailed to her home, PCP signed and sent original to scan.  Patient has been notified by detailed message of result and also mailed her a copy of the results.

## 2015-06-25 NOTE — Telephone Encounter (Signed)
Called the patient to inform have called Cologuard to request results to be refaxed to [place] at [phone].  Once received will contact the patient of results and mail a copy to his home and scan copy to chart.

## 2015-06-25 NOTE — Telephone Encounter (Signed)
Caller name: Celicia   Relationship to patient: Self  Can be reached:  [phone]  Reason for call: Pt called in to confirm receipt of her urine sample for her colonoscopy. She says that she sent it off by [employer] to [place] 3 weeks ago. She called in to get her results.

## 2015-06-25 NOTE — Telephone Encounter (Signed)
Patient called back and informed her result was negative.

## 2015-07-27 ENCOUNTER — Ambulatory Visit: Payer: PRIVATE HEALTH INSURANCE | Admitting: Family Medicine

## 2015-07-28 ENCOUNTER — Encounter: Payer: Self-pay | Admitting: Family Medicine

## 2015-07-28 ENCOUNTER — Ambulatory Visit (INDEPENDENT_AMBULATORY_CARE_PROVIDER_SITE_OTHER): Payer: PRIVATE HEALTH INSURANCE | Admitting: Family Medicine

## 2015-07-28 VITALS — BP 128/78 | HR 82 | Temp 98.1°F | Resp 16 | Ht 67.0 in | Wt 207.0 lb

## 2015-07-28 DIAGNOSIS — E782 Mixed hyperlipidemia: Secondary | ICD-10-CM | POA: Diagnosis not present

## 2015-07-28 DIAGNOSIS — N289 Disorder of kidney and ureter, unspecified: Secondary | ICD-10-CM | POA: Diagnosis not present

## 2015-07-28 DIAGNOSIS — T7840XS Allergy, unspecified, sequela: Secondary | ICD-10-CM

## 2015-07-28 DIAGNOSIS — E663 Overweight: Secondary | ICD-10-CM

## 2015-07-28 DIAGNOSIS — E039 Hypothyroidism, unspecified: Secondary | ICD-10-CM | POA: Diagnosis not present

## 2015-07-28 LAB — COMPREHENSIVE METABOLIC PANEL
ALK PHOS: 69 U/L (ref 39–117)
ALT: 20 U/L (ref 0–35)
AST: 22 U/L (ref 0–37)
Albumin: 3.8 g/dL (ref 3.5–5.2)
BUN: 34 mg/dL — AB (ref 6–23)
CO2: 29 mEq/L (ref 19–32)
Calcium: 9.4 mg/dL (ref 8.4–10.5)
Chloride: 107 mEq/L (ref 96–112)
Creatinine, Ser: 1.55 mg/dL — ABNORMAL HIGH (ref 0.40–1.20)
GFR: 43.38 mL/min — ABNORMAL LOW (ref >60.00)
GLUCOSE: 85 mg/dL (ref 70–99)
POTASSIUM: 4 meq/L (ref 3.5–5.1)
SODIUM: 141 meq/L (ref 135–145)
TOTAL PROTEIN: 7.7 g/dL (ref 6.0–8.3)
Total Bilirubin: 0.5 mg/dL (ref 0.2–1.2)

## 2015-07-28 LAB — CBC
HEMATOCRIT: 36.7 % (ref 36.0–46.0)
HEMOGLOBIN: 11.9 g/dL — AB (ref 12.0–15.0)
MCHC: 32.6 g/dL (ref 30.0–36.0)
MCV: 85.5 fl (ref 78.0–100.0)
Platelets: 168 10*3/uL (ref 150.0–400.0)
RBC: 4.29 Mil/uL (ref 3.87–5.11)
RDW: 14 % (ref 11.5–15.5)
WBC: 4.1 10*3/uL (ref 4.0–10.5)

## 2015-07-28 LAB — LIPID PANEL
Cholesterol: 219 mg/dL — ABNORMAL HIGH (ref 0–200)
HDL: 101.9 mg/dL (ref >39.00)
LDL Cholesterol: 110 mg/dL — ABNORMAL HIGH (ref 0–99)
NONHDL: 117.09
Total CHOL/HDL Ratio: 2
Triglycerides: 34 mg/dL (ref 0.0–149.0)
VLDL: 6.8 mg/dL (ref 0.0–40.0)

## 2015-07-28 LAB — TSH: TSH: 0.27 u[IU]/mL — ABNORMAL LOW (ref 0.35–4.50)

## 2015-07-28 MED ORDER — LOSARTAN POTASSIUM 100 MG PO TABS: 100.0000 mg | ORAL_TABLET | Freq: Every day | ORAL | Status: DC

## 2015-07-28 MED ORDER — MONTELUKAST SODIUM 10 MG PO TABS: 10.0000 mg | ORAL_TABLET | Freq: Every evening | ORAL | Status: DC | PRN

## 2015-07-28 NOTE — Patient Instructions (Signed)

## 2015-07-28 NOTE — Progress Notes (Signed)
Pre visit review using our clinic review tool, if applicable. No additional management support is needed unless otherwise documented below in the visit note. 

## 2015-07-29 ENCOUNTER — Other Ambulatory Visit: Payer: Self-pay | Admitting: Family Medicine

## 2015-07-29 MED ORDER — LEVOTHYROXINE SODIUM 112 MCG PO TABS: 112.0000 ug | ORAL_TABLET | Freq: Every day | ORAL | Status: DC

## 2015-07-31 ENCOUNTER — Encounter: Payer: Self-pay | Admitting: Family Medicine

## 2015-07-31 NOTE — Assessment & Plan Note (Signed)
Improved with recent blood draw. Stay well hydrated. Minimize OTC meds

## 2015-07-31 NOTE — Progress Notes (Signed)
Subjective:    Patient ID: Stephanie Pierce, female    DOB: 1951-01-17, 64 y.o.   MRN: [id number]  Chief Complaint  Patient presents with  . Follow-up    hyperlipidemia  . Cough    productive, clear    HPI Patient is in today for follow-up. In teen is to struggle with a cough although it is somewhat improved. Does have postnasal drip. Was having some intermittent trouble with cough and chest pain but that is resolved. No fevers chills. Cough is nonproductive. No other recent illness. Denies CP/palp/SOB/HA/fevers/GI or GU c/o. Taking meds as prescribed  Past Medical History  Diagnosis Date  . Pneumonia 2012  . DCIS (ductal carcinoma in situ) of breast   . Enlarged thyroid gland   . Breast fibrocystic disorder   . Hyperlipidemia, mixed 03/23/2015  . Renal insufficiency 03/23/2015  . S/P gastric surgery 03/23/2015    Gastric sleeve  . Overweight 03/23/2015  . Allergic state 03/23/2015  . History of chicken pox 03/23/2015  . DCIS (ductal carcinoma in situ) of breast March 2015    right  . Hypothyroidism 03/23/2015  . H/O iron deficiency anemia 03/23/2015  . Allergy   . Cancer ([place])   . Vitamin D deficiency 05/12/2015    Past Surgical History  Procedure Laterality Date  . Cesarean section  1985  . Laparoscopic gastric sleeve resection    . Breast lumpectomy      h/o 2 needle biopsy, in 2015 right Lumpectomy DCIS stage 0    Family History  Problem Relation Age of Onset  . Hypertension Father   . Heart attack Father   . Alcohol abuse Father   . Diabetes Brother   . Other Brother     complications of exposure to agent orange  . Stroke Brother   . Diverticulosis Daughter   . Diabetes Sister   . Hypertension Sister     Social History   Social History  . Marital Status: Divorced    Spouse Name: N/A  . Number of Children: N/A  . Years of Education: 62   Occupational History  . RN Case Manager    Social History Main Topics  . Smoking status: Never Smoker   .  Smokeless tobacco: Not on file  . Alcohol Use: No  . Drug Use: No  . 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 Not on file   Social History Narrative    Outpatient Prescriptions Prior to Visit  Medication Sig Dispense Refill  . Calcium Citrate-Vitamin D (CALCIUM CITRATE CHEWY BITE) 500-500 MG-UNIT CHEW Chew by mouth daily.    . Cholecalciferol (VITAMIN D3) 10000 UNITS capsule Take 10,000 Units by mouth every other day.    . Cyanocobalamin (B-12) 2500 MCG TABS Take 1 tablet by mouth once a week.    Marland Kitchen ibuprofen (ADVIL,MOTRIN) 200 MG tablet Take 200 mg by mouth every 6 (six) hours as needed.    Marland Kitchen KRILL OIL OMEGA-3 PO Take by mouth. Take 2 daily    . Multiple Vitamins-Minerals (CENTRUM SILVER ADULT 50+ PO) Take 1 tablet by mouth every other day.    . levothyroxine (SYNTHROID, LEVOTHROID) 125 MCG tablet Take 1 tablet (125 mcg total) by mouth daily before breakfast. 90 tablet 2  . losartan (COZAAR) 100 MG tablet Take 100 mg by mouth daily.  No facility-administered medications prior to visit.    Allergies  Allergen Reactions  . Lisinopril Swelling    Swelling in the throat  . Penicillins Rash    Review of Systems  Constitutional: Negative for fever and malaise/fatigue.  HENT: Positive for congestion.   Eyes: Negative for discharge.  Respiratory: Positive for cough. Negative for shortness of breath.   Cardiovascular: Negative for chest pain, palpitations and leg swelling.  Gastrointestinal: Negative for nausea and abdominal pain.  Genitourinary: Negative for dysuria.  Musculoskeletal: Negative for falls.  Skin: Negative for rash.  Neurological: Negative for loss of consciousness and headaches.  Endo/Heme/Allergies: Negative for environmental allergies.  Psychiatric/Behavioral: Negative for depression. The patient is not nervous/anxious.         Objective:    Physical Exam  Constitutional: She is oriented to person, place, and time. She appears well-developed and well-nourished. No distress.  HENT:  Head: Normocephalic and atraumatic.  Nose: Nose normal.  Eyes: Right eye exhibits no discharge. Left eye exhibits no discharge.  Neck: Normal range of motion. Neck supple.  Cardiovascular: Normal rate and regular rhythm.   No murmur heard. Pulmonary/Chest: Effort normal and breath sounds normal.  Abdominal: Soft. Bowel sounds are normal. There is no tenderness.  Musculoskeletal: She exhibits no edema.  Neurological: She is alert and oriented to person, place, and time.  Skin: Skin is warm and dry.  Psychiatric: She has a normal mood and affect.  Nursing note and vitals reviewed.   BP 128/78 mmHg  Pulse 82  Temp(Src) 98.1 F (36.7 C) (Oral)  Resp 16  Ht 5' 7"  (1.702 m)  Wt 207 lb (93.895 kg)  BMI 32.41 kg/m2  SpO2 98% Wt Readings from Last 3 Encounters:  07/28/15 207 lb (93.895 kg)  05/12/15 201 lb (91.173 kg)  04/08/15 194 lb 12.8 oz (88.361 kg)     Lab Results  Component Value Date   WBC 4.1 07/28/2015   HGB 11.9* 07/28/2015   HCT 36.7 07/28/2015   PLT 168.0 07/28/2015   GLUCOSE 85 07/28/2015   CHOL 219* 07/28/2015   TRIG 34.0 07/28/2015   HDL 101.90 07/28/2015   LDLCALC 110* 07/28/2015   ALT 20 07/28/2015   AST 22 07/28/2015   NA 141 07/28/2015   K 4.0 07/28/2015   CL 107 07/28/2015   CREATININE 1.55* 07/28/2015   BUN 34* 07/28/2015   CO2 29 07/28/2015   TSH 0.27* 07/28/2015    Lab Results  Component Value Date   TSH 0.27* 07/28/2015   Lab Results  Component Value Date   WBC 4.1 07/28/2015   HGB 11.9* 07/28/2015   HCT 36.7 07/28/2015   MCV 85.5 07/28/2015   PLT 168.0 07/28/2015   Lab Results  Component Value Date   NA 141 07/28/2015   K 4.0 07/28/2015   CHLORIDE 107 05/12/2015   CO2 29 07/28/2015   GLUCOSE 85 07/28/2015   BUN 34* 07/28/2015   CREATININE 1.55* 07/28/2015    BILITOT 0.5 07/28/2015   ALKPHOS 69 07/28/2015   AST 22 07/28/2015   ALT 20 07/28/2015   PROT 7.7 07/28/2015   ALBUMIN 3.8 07/28/2015   CALCIUM 9.4 07/28/2015   ANIONGAP 7 05/12/2015   EGFR 37* 05/12/2015   GFR 43.38* 07/28/2015   Lab Results  Component Value Date   CHOL 219* 07/28/2015   Lab Results  Component Value Date   HDL 101.90 07/28/2015   Lab Results  Component Value Date   LDLCALC 110* 07/28/2015   Lab Results  Component Value Date   TRIG 34.0 07/28/2015   Lab Results  Component Value Date   CHOLHDL 2 07/28/2015   No results found for: HGBA1C     Assessment & Plan:   Problem List Items Addressed This Visit    Allergic state - Primary    Cough improving, antihistamines prn      Relevant Medications   montelukast (SINGULAIR) 10 MG tablet   Other Relevant Orders   TSH (Completed)   CBC (Completed)   Lipid panel (Completed)   Comprehensive metabolic panel (Completed)   Hyperlipidemia, mixed    Encouraged heart healthy diet, increase exercise, avoid trans fats, consider a krill oil cap daily      Relevant Medications   montelukast (SINGULAIR) 10 MG tablet   losartan (COZAAR) 100 MG tablet   Other Relevant Orders   TSH (Completed)   CBC (Completed)   Lipid panel (Completed)   Comprehensive metabolic panel (Completed)   Hypothyroidism    overtreated drop Levothyroxine 112 mcg daily      Overweight    Encouraged DASH diet, decrease po intake and increase exercise as tolerated. Needs 7-8 hours of sleep nightly. Avoid trans fats, eat small, frequent meals every 4-5 hours with lean proteins, complex carbs and healthy fats. Minimize simple carbs      Renal insufficiency    Improved with recent blood draw. Stay well hydrated. Minimize OTC meds       Relevant Medications   montelukast (SINGULAIR) 10 MG tablet   Other Relevant Orders   TSH (Completed)   CBC (Completed)   Lipid panel (Completed)   Comprehensive metabolic panel (Completed)       I have discontinued Ms. Swartout's losartan. I am also having her start on montelukast and losartan. Additionally, I am having her maintain her Multiple Vitamins-Minerals (CENTRUM SILVER ADULT 50+ PO), B-12, Vitamin D3, Calcium Citrate-Vitamin D, KRILL OIL OMEGA-3 PO, and ibuprofen.  Meds ordered this encounter  Medications  . montelukast (SINGULAIR) 10 MG tablet    Sig: Take 1 tablet (10 mg total) by mouth at bedtime as needed.    Dispense:  90 tablet    Refill:  1  . losartan (COZAAR) 100 MG tablet    Sig: Take 1 tablet (100 mg total) by mouth daily.    Dispense:  90 tablet    Refill:  3     Penni Homans, MD

## 2015-07-31 NOTE — Assessment & Plan Note (Signed)
Encouraged DASH diet, decrease po intake and increase exercise as tolerated. Needs 7-8 hours of sleep nightly. Avoid trans fats, eat small, frequent meals every 4-5 hours with lean proteins, complex carbs and healthy fats. Minimize simple carbs 

## 2015-07-31 NOTE — Assessment & Plan Note (Signed)
overtreated drop Levothyroxine 112 mcg daily

## 2015-07-31 NOTE — Assessment & Plan Note (Signed)
Encouraged heart healthy diet, increase exercise, avoid trans fats, consider a krill oil cap daily 

## 2015-07-31 NOTE — Assessment & Plan Note (Signed)
Cough improving, antihistamines prn

## 2015-08-20 ENCOUNTER — Telehealth: Payer: Self-pay | Admitting: Hematology & Oncology

## 2015-08-20 NOTE — Telephone Encounter (Signed)
Faxed medical records to: [facility] F: [phone] P: [phone]   Policy: [id number] - [place] SCANNED

## 2015-09-01 ENCOUNTER — Encounter: Payer: Self-pay | Admitting: Family Medicine

## 2015-09-01 ENCOUNTER — Other Ambulatory Visit: Payer: Self-pay | Admitting: Family Medicine

## 2015-09-01 MED ORDER — LEVOTHYROXINE SODIUM 112 MCG PO TABS: 112.0000 ug | ORAL_TABLET | Freq: Every day | ORAL | Status: DC

## 2015-09-02 MED FILL — SYNTHROID 112 MCG TABLET: 112 | 30 days supply | Qty: 30 | Fill #0

## 2015-10-04 MED FILL — SYNTHROID 112 MCG TABLET: 112 | 30 days supply | Qty: 30 | Fill #1

## 2015-10-19 ENCOUNTER — Other Ambulatory Visit: Payer: Self-pay | Admitting: Hematology & Oncology

## 2015-10-19 ENCOUNTER — Other Ambulatory Visit: Payer: Self-pay | Admitting: Family Medicine

## 2015-10-19 DIAGNOSIS — Z853 Personal history of malignant neoplasm of breast: Secondary | ICD-10-CM

## 2015-10-19 MED ORDER — LEVOTHYROXINE SODIUM 112 MCG PO TABS: 112.0000 ug | ORAL_TABLET | Freq: Every day | ORAL | Status: DC

## 2015-10-27 ENCOUNTER — Other Ambulatory Visit (INDEPENDENT_AMBULATORY_CARE_PROVIDER_SITE_OTHER): Payer: PRIVATE HEALTH INSURANCE

## 2015-10-27 ENCOUNTER — Encounter: Payer: Self-pay | Admitting: Hematology & Oncology

## 2015-10-27 DIAGNOSIS — E039 Hypothyroidism, unspecified: Secondary | ICD-10-CM | POA: Diagnosis not present

## 2015-10-27 LAB — TSH: TSH: 1.35 u[IU]/mL (ref 0.35–4.50)

## 2015-11-03 ENCOUNTER — Ambulatory Visit
Admission: RE | Admit: 2015-11-03 | Discharge: 2015-11-03 | Disposition: A | Discharge status: Home / Self Care | Payer: PRIVATE HEALTH INSURANCE | Source: Ambulatory Visit | Attending: Hematology & Oncology | Admitting: Hematology & Oncology

## 2015-11-03 DIAGNOSIS — Z853 Personal history of malignant neoplasm of breast: Secondary | ICD-10-CM

## 2015-11-11 ENCOUNTER — Other Ambulatory Visit: Payer: PRIVATE HEALTH INSURANCE

## 2015-11-11 ENCOUNTER — Ambulatory Visit: Payer: PRIVATE HEALTH INSURANCE | Admitting: Hematology & Oncology

## 2015-11-22 ENCOUNTER — Encounter: Payer: Self-pay | Admitting: Hematology & Oncology

## 2015-11-22 ENCOUNTER — Other Ambulatory Visit (HOSPITAL_BASED_OUTPATIENT_CLINIC_OR_DEPARTMENT_OTHER): Payer: PRIVATE HEALTH INSURANCE

## 2015-11-22 ENCOUNTER — Ambulatory Visit (HOSPITAL_BASED_OUTPATIENT_CLINIC_OR_DEPARTMENT_OTHER): Payer: PRIVATE HEALTH INSURANCE | Admitting: Hematology & Oncology

## 2015-11-22 VITALS — BP 116/78 | HR 63 | Temp 97.9°F | Resp 18 | Ht 67.0 in | Wt 207.0 lb

## 2015-11-22 DIAGNOSIS — D0511 Intraductal carcinoma in situ of right breast: Secondary | ICD-10-CM

## 2015-11-22 DIAGNOSIS — E559 Vitamin D deficiency, unspecified: Secondary | ICD-10-CM

## 2015-11-22 LAB — CBC WITH DIFFERENTIAL (CANCER CENTER ONLY)
BASO#: 0 10*3/uL (ref 0.0–0.2)
BASO%: 0.8 % (ref 0.0–2.0)
EOS%: 3.8 % (ref 0.0–7.0)
Eosinophils Absolute: 0.2 10*3/uL (ref 0.0–0.5)
HEMATOCRIT: 35.1 % (ref 34.8–46.6)
HEMOGLOBIN: 11.7 g/dL (ref 11.6–15.9)
LYMPH#: 1.2 10*3/uL (ref 0.9–3.3)
LYMPH%: 30.5 % (ref 14.0–48.0)
MCH: 28.6 pg (ref 26.0–34.0)
MCHC: 33.3 g/dL (ref 32.0–36.0)
MCV: 86 fL (ref 81–101)
MONO#: 0.6 10*3/uL (ref 0.1–0.9)
MONO%: 15.4 % — ABNORMAL HIGH (ref 0.0–13.0)
NEUT%: 49.5 % (ref 39.6–80.0)
NEUTROS ABS: 2 10*3/uL (ref 1.5–6.5)
Platelets: 179 10*3/uL (ref 145–400)
RBC: 4.09 10*6/uL (ref 3.70–5.32)
RDW: 13.9 % (ref 11.1–15.7)
WBC: 4 10*3/uL (ref 3.9–10.0)

## 2015-11-22 LAB — COMPREHENSIVE METABOLIC PANEL
ALBUMIN: 3.6 g/dL (ref 3.5–5.0)
ALT: 23 U/L (ref 0–55)
ANION GAP: 7 meq/L (ref 3–11)
AST: 28 U/L (ref 5–34)
Alkaline Phosphatase: 66 U/L (ref 40–150)
BILIRUBIN TOTAL: 0.49 mg/dL (ref 0.20–1.20)
BUN: 37.4 mg/dL — ABNORMAL HIGH (ref 7.0–26.0)
CALCIUM: 9.2 mg/dL (ref 8.4–10.4)
CHLORIDE: 109 meq/L (ref 98–109)
CO2: 24 mEq/L (ref 22–29)
CREATININE: 1.5 mg/dL — AB (ref 0.6–1.1)
EGFR: 42 mL/min/{1.73_m2} — ABNORMAL LOW (ref >90)
Glucose: 88 mg/dl (ref 70–140)
Potassium: 4.1 mEq/L (ref 3.5–5.1)
Sodium: 140 mEq/L (ref 136–145)
TOTAL PROTEIN: 7.8 g/dL (ref 6.4–8.3)

## 2015-11-22 NOTE — Progress Notes (Signed)
Hematology and Oncology Follow Up Visit  Stephanie Pierce [id number] December 07, 1950 65 y.o. 11/22/2015   Principle Diagnosis:   Ductal carcinoma in situ of the right breast  Current Therapy:    Observation     Interim History:  Stephanie Pierce is back for follow-up. We first saw her back in September of last year. At that point on, she just moved from [place]. Her she had a history of DCIS of the right breast. This was diagnosed back in July 2015 area and she underwent brachytherapy for this. She declined any type of antiestrogen therapy.  She's doing well. She is worried about the size of her breasts. She wants to have breast reduction surgery. I'll see what we can do to get her set up to be seen by reconstructive surgery.  She's had no other problems. She is working on her PhD. She is doing her dissertation soon.  She's had no fever. She's had no nausea or vomiting. She's had no change in bowel or bladder habits.  Her mammogram was done on March 8. This looked okay.  She is taking her vitamin D. She's had no joint issues. She's had no rashes.  Overall, her performance status is ECOG 0.  Medications:  Current outpatient prescriptions:  .  Calcium Citrate-Vitamin D (CALCIUM CITRATE CHEWY BITE) 500-500 MG-UNIT CHEW, Chew by mouth daily., Disp: , Rfl:  .  Cholecalciferol (VITAMIN D3) 10000 UNITS capsule, Take 10,000 Units by mouth every other day., Disp: , Rfl:  .  Cyanocobalamin (B-12) 2500 MCG TABS, Take 1 tablet by mouth once a week., Disp: , Rfl:  .  KRILL OIL OMEGA-3 PO, Take by mouth. Take 2 daily, Disp: , Rfl:  .  levothyroxine (SYNTHROID, LEVOTHROID) 112 MCG tablet, Take 1 tablet (112 mcg total) by mouth daily., Disp: 90 tablet, Rfl: 0 .  losartan (COZAAR) 100 MG tablet, Take 1 tablet (100 mg total) by mouth daily., Disp: 90 tablet, Rfl: 3 .  montelukast (SINGULAIR) 10 MG tablet, Take 1 tablet (10 mg total) by mouth at bedtime as needed., Disp: 90 tablet, Rfl: 1 .  Multiple  Vitamins-Minerals (CENTRUM SILVER ADULT 50+ PO), Take 1 tablet by mouth every other day., Disp: , Rfl:   Allergies:  Allergies  Allergen Reactions  . Lisinopril Swelling    Swelling in the throat  . Penicillins Rash    Past Medical History, Surgical history, Social history, and Family History were reviewed and updated.  Review of Systems: As above  Physical Exam:  height is '5\' 7"'$  (1.702 m) and weight is 207 lb (93.895 kg). Her oral temperature is 97.9 F (36.6 C). Her blood pressure is 116/78 and her pulse is 63. Her respiration is 18.   Wt Readings from Last 3 Encounters:  11/22/15 207 lb (93.895 kg)  07/28/15 207 lb (93.895 kg)  05/12/15 201 lb (91.173 kg)     Well-developed and well-nourished African-American female in no obvious distress. Head and neck exam shows no ocular or oral lesions. She has no palpable cervical or supraclavicular lymph nodes. Lungs are clear. Cardiac exam regular rate and rhythm with no murmurs, rubs or bruits. Abdomen is soft. She has good bowel sounds. There is no fluid wave. There is no palpable liver or spleen tip. Breast exam shows left breast with no masses, edema or erythema. There is no left axillary adenopathy. Right breast shows the well-healed lumpectomy at about the 8:00 position. There is some firmness at the lumpectomy site. No distinct masses noted in  the right breast. There is no right axillary adenopathy. Back exam shows no tenderness over the spine, ribs or hips. Extremities shows no clubbing, cyanosis or edema. She has no lymphedema of the right arm. Skin exam shows no rashes, ecchymoses or petechia. Neurological exam shows no focal neurological deficits.  Lab Results  Component Value Date   WBC 4.0 11/22/2015   HGB 11.7 11/22/2015   HCT 35.1 11/22/2015   MCV 86 11/22/2015   PLT 179 11/22/2015     Chemistry      Component Value Date/Time   NA 141 07/28/2015 0851   NA 142 05/12/2015 1013   K 4.0 07/28/2015 0851   K 4.5 05/12/2015  1013   CL 107 07/28/2015 0851   CO2 29 07/28/2015 0851   CO2 28 05/12/2015 1013   BUN 34* 07/28/2015 0851   BUN 35.8* 05/12/2015 1013   CREATININE 1.55* 07/28/2015 0851   CREATININE 1.7* 05/12/2015 1013      Component Value Date/Time   CALCIUM 9.4 07/28/2015 0851   CALCIUM 9.9 05/12/2015 1013   ALKPHOS 69 07/28/2015 0851   ALKPHOS 79 05/12/2015 1013   AST 22 07/28/2015 0851   AST 26 05/12/2015 1013   ALT 20 07/28/2015 0851   ALT 23 05/12/2015 1013   BILITOT 0.5 07/28/2015 0851   BILITOT 0.58 05/12/2015 1013         Impression and Plan: Stephanie Pierce is A 84 year old African-American female with DCIS of the right breast. She was diagnosed back in July 2015. She elected to forego any hormonal therapy.  I think that her risk of recurrence or risk of invasive breast cancer will be minimal.  For now, I will see if Dr. Marla Roe, of reconstructive surgery, can see her to see about any therapeutic breast reduction surgery.  I don't see any indication for additional x-ray studies.  We will plan to get her back in 6 more months.  Volanda Napoleon, MD 3/27/20178:32 AM

## 2015-11-23 ENCOUNTER — Encounter: Payer: Self-pay | Admitting: *Deleted

## 2015-11-23 LAB — VITAMIN D 25 HYDROXY (VIT D DEFICIENCY, FRACTURES): Vitamin D, 25-Hydroxy: 34.6 ng/mL (ref 30.0–100.0)

## 2015-12-12 ENCOUNTER — Other Ambulatory Visit: Payer: Self-pay | Admitting: Family Medicine

## 2016-01-21 ENCOUNTER — Ambulatory Visit (INDEPENDENT_AMBULATORY_CARE_PROVIDER_SITE_OTHER): Payer: PRIVATE HEALTH INSURANCE | Admitting: Medical

## 2016-01-21 ENCOUNTER — Telehealth: Payer: Self-pay | Admitting: Medical

## 2016-01-21 ENCOUNTER — Ambulatory Visit (HOSPITAL_BASED_OUTPATIENT_CLINIC_OR_DEPARTMENT_OTHER)
Admission: RE | Admit: 2016-01-21 | Discharge: 2016-01-21 | Disposition: A | Discharge status: Home / Self Care | Payer: PRIVATE HEALTH INSURANCE | Source: Ambulatory Visit | Attending: Medical | Admitting: Medical

## 2016-01-21 ENCOUNTER — Other Ambulatory Visit: Payer: Self-pay

## 2016-01-21 ENCOUNTER — Emergency Department (HOSPITAL_BASED_OUTPATIENT_CLINIC_OR_DEPARTMENT_OTHER): Payer: PRIVATE HEALTH INSURANCE

## 2016-01-21 ENCOUNTER — Emergency Department (HOSPITAL_BASED_OUTPATIENT_CLINIC_OR_DEPARTMENT_OTHER)
Admission: EM | Admit: 2016-01-21 | Discharge: 2016-01-22 | Disposition: A | Discharge status: Home / Self Care | Payer: PRIVATE HEALTH INSURANCE | Attending: Emergency Medicine | Admitting: Emergency Medicine

## 2016-01-21 ENCOUNTER — Encounter (HOSPITAL_BASED_OUTPATIENT_CLINIC_OR_DEPARTMENT_OTHER): Payer: Self-pay | Admitting: *Deleted

## 2016-01-21 ENCOUNTER — Encounter: Payer: Self-pay | Admitting: Medical

## 2016-01-21 VITALS — BP 110/70 | HR 87 | Temp 98.3°F | Ht 67.0 in | Wt 196.6 lb

## 2016-01-21 DIAGNOSIS — R05 Cough: Secondary | ICD-10-CM | POA: Insufficient documentation

## 2016-01-21 DIAGNOSIS — E785 Hyperlipidemia, unspecified: Secondary | ICD-10-CM | POA: Insufficient documentation

## 2016-01-21 DIAGNOSIS — R918 Other nonspecific abnormal finding of lung field: Secondary | ICD-10-CM | POA: Insufficient documentation

## 2016-01-21 DIAGNOSIS — R0781 Pleurodynia: Secondary | ICD-10-CM

## 2016-01-21 DIAGNOSIS — R791 Abnormal coagulation profile: Secondary | ICD-10-CM | POA: Insufficient documentation

## 2016-01-21 DIAGNOSIS — E039 Hypothyroidism, unspecified: Secondary | ICD-10-CM | POA: Diagnosis not present

## 2016-01-21 DIAGNOSIS — R509 Fever, unspecified: Secondary | ICD-10-CM | POA: Diagnosis not present

## 2016-01-21 DIAGNOSIS — R7989 Other specified abnormal findings of blood chemistry: Secondary | ICD-10-CM | POA: Diagnosis present

## 2016-01-21 DIAGNOSIS — R059 Cough, unspecified: Secondary | ICD-10-CM

## 2016-01-21 DIAGNOSIS — Z79899 Other long term (current) drug therapy: Secondary | ICD-10-CM | POA: Diagnosis not present

## 2016-01-21 DIAGNOSIS — N289 Disorder of kidney and ureter, unspecified: Secondary | ICD-10-CM

## 2016-01-21 DIAGNOSIS — D0511 Intraductal carcinoma in situ of right breast: Secondary | ICD-10-CM | POA: Diagnosis not present

## 2016-01-21 LAB — CBC WITH DIFFERENTIAL/PLATELET
BASOS ABS: 0 10*3/uL (ref 0.0–0.1)
Basophils Relative: 0.7 % (ref 0.0–3.0)
EOS ABS: 0.2 10*3/uL (ref 0.0–0.7)
Eosinophils Relative: 4 % (ref 0.0–5.0)
HEMATOCRIT: 33 % — AB (ref 36.0–46.0)
HEMOGLOBIN: 10.8 g/dL — AB (ref 12.0–15.0)
LYMPHS PCT: 15 % (ref 12.0–46.0)
Lymphs Abs: 0.9 10*3/uL (ref 0.7–4.0)
MCHC: 32.9 g/dL (ref 30.0–36.0)
MCV: 83.5 fl (ref 78.0–100.0)
Monocytes Absolute: 0.6 10*3/uL (ref 0.1–1.0)
Monocytes Relative: 9.9 % (ref 3.0–12.0)
Neutro Abs: 4.1 10*3/uL (ref 1.4–7.7)
Neutrophils Relative %: 70.4 % (ref 43.0–77.0)
Platelets: 248 10*3/uL (ref 150.0–400.0)
RBC: 3.95 Mil/uL (ref 3.87–5.11)
RDW: 13.5 % (ref 11.5–15.5)
WBC: 5.9 10*3/uL (ref 4.0–10.5)

## 2016-01-21 LAB — D-DIMER, QUANTITATIVE: D-Dimer, Quant: 3.67 ug/mL-FEU — ABNORMAL HIGH (ref 0.00–0.48)

## 2016-01-21 LAB — BASIC METABOLIC PANEL
Anion gap: 8 (ref 5–15)
BUN: 29 mg/dL — ABNORMAL HIGH (ref 6–20)
CHLORIDE: 103 mmol/L (ref 101–111)
CO2: 26 mmol/L (ref 22–32)
CREATININE: 1.59 mg/dL — AB (ref 0.44–1.00)
Calcium: 9.3 mg/dL (ref 8.9–10.3)
GFR calc non Af Amer: 33 mL/min — ABNORMAL LOW (ref >60)
GFR, EST AFRICAN AMERICAN: 39 mL/min — AB (ref >60)
Glucose, Bld: 87 mg/dL (ref 65–99)
POTASSIUM: 3.8 mmol/L (ref 3.5–5.1)
SODIUM: 137 mmol/L (ref 135–145)

## 2016-01-21 LAB — CBC
HEMATOCRIT: 33.3 % — AB (ref 36.0–46.0)
HEMOGLOBIN: 10.9 g/dL — AB (ref 12.0–15.0)
MCH: 28 pg (ref 26.0–34.0)
MCHC: 32.7 g/dL (ref 30.0–36.0)
MCV: 85.6 fL (ref 78.0–100.0)
Platelets: 239 10*3/uL (ref 150–400)
RBC: 3.89 MIL/uL (ref 3.87–5.11)
RDW: 12.7 % (ref 11.5–15.5)
WBC: 5.1 10*3/uL (ref 4.0–10.5)

## 2016-01-21 LAB — D-DIMER, QUANTITATIVE (NOT AT ARMC): D DIMER QUANT: 2.94 ug{FEU}/mL — AB (ref 0.00–0.50)

## 2016-01-21 LAB — TROPONIN I: Troponin I: <0.03 ng/mL (ref <0.031)

## 2016-01-21 MED ORDER — FLUTICASONE PROPIONATE 50 MCG/ACT NA SUSP: 2.0000 | Freq: Every day | NASAL | Status: DC

## 2016-01-21 MED ORDER — DIPHENHYDRAMINE HCL 25 MG PO CAPS
50.0000 mg | ORAL_CAPSULE | Freq: Once | ORAL | Status: AC
  Administered 2016-01-21: 50 mg via ORAL
  Filled 2016-01-21: qty 2

## 2016-01-21 MED ORDER — HYDROCORTISONE NA SUCCINATE PF 100 MG IJ SOLR
200.0000 mg | Freq: Once | INTRAMUSCULAR | Status: AC
  Administered 2016-01-21: 200 mg via INTRAVENOUS
  Filled 2016-01-21: qty 4

## 2016-01-21 MED ORDER — IOPAMIDOL (ISOVUE-370) INJECTION 76%
80.0000 mL | Freq: Once | INTRAVENOUS | Status: AC | PRN
  Administered 2016-01-21: 80 mL via INTRAVENOUS

## 2016-01-21 MED ORDER — BENZONATATE 100 MG PO CAPS: 100.0000 mg | ORAL_CAPSULE | Freq: Three times a day (TID) | ORAL | Status: DC | PRN

## 2016-01-21 MED ORDER — AZITHROMYCIN 250 MG PO TABS: ORAL_TABLET | ORAL | Status: DC

## 2016-01-21 MED FILL — FLUTICASONE PROP 50 MCG SPR: 50 | 30 days supply | Qty: 16 | Fill #0

## 2016-01-21 MED FILL — BENZONATATE 100 MG CAPSULE: 100 | 7 days supply | Qty: 21 | Fill #0

## 2016-01-21 MED FILL — AZITHROMYCIN 250 MG TABLET: 250 | 5 days supply | Qty: 6 | Fill #0

## 2016-01-21 NOTE — Progress Notes (Signed)
Subjective:    Patient ID: Ginnie Smart, female    DOB: [birth year], 65 y.o.   MRN: [id number]  HPI  Pt in for evaluation. Pt states since July of last year had some allergy symptoms. This has been chronic except when snowed.  Pt just recently some rt lower lung region/rib pain when she takes a deep breath. She did cough up some mucous on Jan 02, 2016. Occasionally she will cough up some mucous since then but has been rare.  Pt is up to date on mammogram. Pt had DCIS. Just recently followed up with specialist.   Pt thinks she had some fevers recently. Some sweating at times. Pt states energy level ok.  No wheezing. Sometimes when walking fast and deeper breathing will have pain.    Review of Systems  Constitutional: Positive for fever and diaphoresis. Negative for chills.  HENT: Positive for sneezing. Negative for congestion, mouth sores, postnasal drip, rhinorrhea and sinus pressure.   Respiratory: Positive for cough and shortness of breath. Negative for chest tightness and wheezing.        Mild on walking fast.  Cardiovascular: Negative for chest pain and palpitations.  Musculoskeletal: Negative for myalgias, back pain and neck pain.  Hematological: Negative for adenopathy. Does not bruise/bleed easily.  Psychiatric/Behavioral: Negative for behavioral problems.   Past Medical History  Diagnosis Date  . Pneumonia 2012  . DCIS (ductal carcinoma in situ) of breast   . Enlarged thyroid gland   . Breast fibrocystic disorder   . Hyperlipidemia, mixed 03/23/2015  . Renal insufficiency 03/23/2015  . S/P gastric surgery 03/23/2015    Gastric sleeve  . Overweight 03/23/2015  . Allergic state 03/23/2015  . History of chicken pox 03/23/2015  . DCIS (ductal carcinoma in situ) of breast March 2015    right  . Hypothyroidism 03/23/2015  . H/O iron deficiency anemia 03/23/2015  . Allergy   . Cancer ([place])   . Vitamin D deficiency 05/12/2015     Social History   Social History  .  Marital Status: Divorced    Spouse Name: N/A  . Number of Children: N/A  . Years of Education: 59   Occupational History  . RN Case Manager    Social History Main Topics  . Smoking status: Never Smoker   . Smokeless tobacco: Not on file  . Alcohol Use: No  . Drug Use: No  . 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 Not on file   Social History Narrative    Past Surgical History  Procedure Laterality Date  . Cesarean section  1985  . Laparoscopic gastric sleeve resection    . Breast lumpectomy      h/o 2 needle biopsy, in 2015 right Lumpectomy DCIS stage 0    Family History  Problem Relation Age of Onset  . Hypertension Father   . Heart attack Father   . Alcohol abuse Father   . Diabetes Brother   . Other Brother     complications of exposure to agent orange  . Stroke Brother   . Diverticulosis Daughter   . Diabetes Sister   . Hypertension Sister     Allergies  Allergen Reactions  . Lisinopril Swelling    Swelling in the throat  . Penicillins Rash    Current Outpatient Prescriptions on File Prior to  Visit  Medication Sig Dispense Refill  . Calcium Citrate-Vitamin D (CALCIUM CITRATE CHEWY BITE) 500-500 MG-UNIT CHEW Chew by mouth daily.    . Cholecalciferol (VITAMIN D3) 10000 UNITS capsule Take 10,000 Units by mouth every other day.    . Cyanocobalamin (B-12) 2500 MCG TABS Take 1 tablet by mouth once a week.    Marland Kitchen KRILL OIL OMEGA-3 PO Take by mouth. Take 2 daily    . losartan (COZAAR) 100 MG tablet Take 1 tablet (100 mg total) by mouth daily. 90 tablet 3  . montelukast (SINGULAIR) 10 MG tablet Take 1 tablet (10 mg total) by mouth at bedtime as needed. 90 tablet 1  . Multiple Vitamins-Minerals (CENTRUM SILVER ADULT 50+ PO) Take 1 tablet by mouth every other day.    Marland Kitchen SYNTHROID 112 MCG tablet Take 1 tablet by mouth  daily 90  tablet 1   No current facility-administered medications on file prior to visit.    BP 110/70 mmHg  Pulse 87  Temp(Src) 98.3 F (36.8 C) (Oral)  Ht '5\' 7"'$  (1.702 m)  Wt 196 lb 9.6 oz (89.177 kg)  BMI 30.78 kg/m2  SpO2 98%        Objective:   Physical Exam  General  Mental Status - Alert. General Appearance - Well groomed. Not in acute distress.  Skin Rashes- No Rashes.   Neck Neck- Supple. No Masses. No jvd.   Chest and Lung Exam Auscultation: Breath Sounds:- even and unlabored, on deep inspirtation rt side deep lung type pain reported.   Cardiovascular Auscultation:Rythm- Regular, rate and rhythm. Murmurs & Other Heart Sounds:Ausculatation of the heart reveal- No Murmurs.  Lymphatic Head & Neck General Head & Neck Lymphatics: Bilateral: Description- No Localized lymphadenopathy.  Lower ext- negative homans signs. No swelling.       Assessment & Plan:  For your cough, subjective fever and sweats will get chest xray and cbc. I think it is best to start you on azithromycin for bronchitis or walking pneumonia.  For cough rx  Benzonatate. You could take otc ibuprofen for pleuritic type pain.  For sneezing reported could take flonase.  Also getting a d-dimer today.If this test is +. Then will need to get ct of the chest to evaluate if pulmonary embolism present.  Follow up in 7 days or as needed  Tahari Clabaugh, Percell Miller, [employer]

## 2016-01-21 NOTE — Progress Notes (Signed)
Pre visit review using our clinic review tool, if applicable. No additional management support is needed unless otherwise documented below in the visit note. 

## 2016-01-21 NOTE — Telephone Encounter (Signed)
Pt is being schedule for ct chest today stat. Elevated d dimer, pleuritic chest pain and lung mass. May need precert.

## 2016-01-21 NOTE — ED Notes (Signed)
PA at bedside.

## 2016-01-21 NOTE — ED Notes (Addendum)
Per pt report was seen by PCP this am, started on antibiotics and cxr was obtained. Was called later regarding elevated d-dimer and the need for further evaluation. Pt reports that she has had issue with contrast dye in the past.

## 2016-01-21 NOTE — ED Provider Notes (Signed)
CSN: [id number]     Arrival date & time 01/21/16  1727 History   First MD Initiated Contact with Patient 01/21/16 1808     Chief Complaint  Patient presents with  . Abnormal Lab     (Consider location/radiation/quality/duration/timing/severity/associated sxs/prior Treatment) HPI Comments: Stephanie Pierce is a 64 y.o. female with history of renal insufficiency, hypothyroidism, and DCIS of breast presents to ED for PE rule out. Patient was seen by her PCP today for shortness of breath, cough, and intermittent fevers. Found to have an elevated d-dimer in office and sent to ED for imaging. She initially states she had hemoptysis approximately three weeks ago. She has had intermittent fevers as well. Furthermore, she experienced left sided discomfort that resolved. On Wednesday she started experiencing right sided chest pain that was worse with movement and deep inspiration. She had associated dyspnea on exertion and generalized weakness prompting her to see her PCP. Patient denies leg pain/swelling, h/o blood clots, recent long distance travel or immobilization, recent cancer or cancer treatment, exogenous estrogen use.   The history is provided by the patient.    Past Medical History  Diagnosis Date  . Pneumonia 2012  . DCIS (ductal carcinoma in situ) of breast   . Enlarged thyroid gland   . Breast fibrocystic disorder   . Hyperlipidemia, mixed 03/23/2015  . Renal insufficiency 03/23/2015  . S/P gastric surgery 03/23/2015    Gastric sleeve  . Overweight 03/23/2015  . Allergic state 03/23/2015  . History of chicken pox 03/23/2015  . DCIS (ductal carcinoma in situ) of breast March 2015    right  . Hypothyroidism 03/23/2015  . H/O iron deficiency anemia 03/23/2015  . Allergy   . Cancer ([place])   . Vitamin D deficiency 05/12/2015   Past Surgical History  Procedure Laterality Date  . Cesarean section  1985  . Laparoscopic gastric sleeve resection    . Breast lumpectomy      h/o 2 needle  biopsy, in 2015 right Lumpectomy DCIS stage 0   Family History  Problem Relation Age of Onset  . Hypertension Father   . Heart attack Father   . Alcohol abuse Father   . Diabetes Brother   . Other Brother     complications of exposure to agent orange  . Stroke Brother   . Diverticulosis Daughter   . Diabetes Sister   . Hypertension Sister    Social History  Substance Use Topics  . Smoking status: Never Smoker   . Smokeless tobacco: None  . Alcohol Use: No   OB History    No data available     Review of Systems  Constitutional: Positive for fever. Negative for chills and diaphoresis.  HENT: Negative for sore throat and trouble swallowing.   Eyes: Negative for visual disturbance.  Respiratory: Positive for cough and shortness of breath.   Cardiovascular: Positive for chest pain. Negative for leg swelling.  Gastrointestinal: Negative for nausea, vomiting, abdominal pain, diarrhea and constipation.  Genitourinary: Negative for dysuria and hematuria.  Musculoskeletal: Negative for neck pain and neck stiffness.  Skin: Negative for rash.  Neurological: Positive for weakness ( generalized). Negative for dizziness, light-headedness and headaches.      Allergies  Lisinopril and Penicillins  Home Medications   Prior to Admission medications   Medication Sig Start Date End Date Taking? Authorizing Provider  azithromycin (ZITHROMAX) 250 MG tablet Take 2 tablets by mouth on day 1, followed by 1 tablet by mouth daily for 4 days. 01/21/16  Edward Saguier, PA-C  benzonatate (TESSALON) 100 MG capsule Take 1 capsule (100 mg total) by mouth 3 (three) times daily as needed. 01/21/16   Percell Miller Saguier, PA-C  Calcium Citrate-Vitamin D (CALCIUM CITRATE CHEWY BITE) 500-500 MG-UNIT CHEW Chew by mouth daily.    Historical Provider, MD  Cholecalciferol (VITAMIN D3) 10000 UNITS capsule Take 10,000 Units by mouth every other day.    Historical Provider, MD  Cyanocobalamin (B-12) 2500 MCG TABS Take  1 tablet by mouth once a week.    Historical Provider, MD  fluticasone (FLONASE) 50 MCG/ACT nasal spray Place 2 sprays into both nostrils daily. 01/21/16   Edward Saguier, PA-C  KRILL OIL OMEGA-3 PO Take by mouth. Take 2 daily    Historical Provider, MD  losartan (COZAAR) 100 MG tablet Take 1 tablet (100 mg total) by mouth daily. 07/28/15   Mosie Lukes, MD  montelukast (SINGULAIR) 10 MG tablet Take 1 tablet (10 mg total) by mouth at bedtime as needed. 07/28/15   Mosie Lukes, MD  Multiple Vitamins-Minerals (CENTRUM SILVER ADULT 50+ PO) Take 1 tablet by mouth every other day.    Historical Provider, MD  SYNTHROID 112 MCG tablet Take 1 tablet by mouth  daily 12/12/15   Mosie Lukes, MD   BP 129/79 mmHg  Pulse 85  Temp(Src) 98.8 F (37.1 C)  Resp 25  Ht '5\' 7"'$  (1.702 m)  Wt 89.359 kg  BMI 30.85 kg/m2  SpO2 99% Physical Exam  Constitutional: She appears well-developed and well-nourished. No distress.  HENT:  Head: Normocephalic and atraumatic.  Mouth/Throat: Oropharynx is clear and moist. No oropharyngeal exudate.  Eyes: Conjunctivae and EOM are normal. Pupils are equal, round, and reactive to light. Right eye exhibits no discharge. Left eye exhibits no discharge. No scleral icterus.  Neck: Normal range of motion. Neck supple.  Cardiovascular: Normal rate, regular rhythm, normal heart sounds and intact distal pulses.   No murmur heard. Pulmonary/Chest: Effort normal and breath sounds normal. No respiratory distress. She has no wheezes.  Abdominal: Soft. Bowel sounds are normal. There is no tenderness. There is no rebound and no guarding.  Musculoskeletal: Normal range of motion. She exhibits no edema or tenderness.  No lower extremity leg swelling or pain noted.   Lymphadenopathy:    She has no cervical adenopathy.  Neurological: She is alert. Coordination normal.  Skin: Skin is warm and dry. She is not diaphoretic.  Psychiatric: She has a normal mood and affect. Her behavior is  normal.    ED Course  Procedures (including critical care time) Labs Review Labs Reviewed  BASIC METABOLIC PANEL - Abnormal; Notable for the following:    BUN 29 (*)    Creatinine, Ser 1.59 (*)    GFR calc non Af Amer 33 (*)    GFR calc Af Amer 39 (*)    All other components within normal limits  CBC - Abnormal; Notable for the following:    Hemoglobin 10.9 (*)    HCT 33.3 (*)    All other components within normal limits  D-DIMER, QUANTITATIVE (NOT AT [facility]) - Abnormal; Notable for the following:    D-Dimer, Quant 2.94 (*)    All other components within normal limits  TROPONIN I    Imaging Review Dg Chest 2 View  01/21/2016  CLINICAL DATA:  Cough, congestion. Left pleuritic chest pain. Fever. EXAM: CHEST  2 VIEW COMPARISON:  None. FINDINGS: There is an irregular rounded opacity noted in the medial left upper lobe measuring  up to 3.6 cm. While this could reflect an area of rounded pneumonia, this is concerning for lung mass and malignancy. Trace left pleural effusions suspected. No focal opacity on the right. Heart is normal size. No acute bony abnormality. IMPRESSION: Rounded masslike opacity in the left upper lobe medially concerning for lung mass/malignancy. Recommend further evaluation with chest CT with IV contrast. Suspect trace left effusion. These results will be called to the ordering clinician or representative by the Radiologist Assistant, and communication documented in the PACS or zVision Dashboard. Electronically Signed   By: Rolm Baptise M.D.   On: 01/21/2016 12:05   Ct Angio Chest Pe W/cm &/or Wo Cm  01/21/2016  CLINICAL DATA:  Acute onset of shortness of breath and elevated D-dimer. Initial encounter. EXAM: CT ANGIOGRAPHY CHEST WITH CONTRAST TECHNIQUE: Multidetector CT imaging of the chest was performed using the standard protocol during bolus administration of intravenous contrast. Multiplanar CT image reconstructions and MIPs were obtained to evaluate the vascular  anatomy. CONTRAST:  80 mL of Isovue 370 IV contrast COMPARISON:  Chest radiograph performed earlier today at 10:30 a.m. FINDINGS: There is no evidence of pulmonary embolus. Scattered masses are noted throughout the lungs bilaterally, measuring up to 5.0 cm in size. These are most prominent peripherally. There is also a 4.3 cm mass seen directly posterior to the right hilum. A trace left pleural effusion is noted. There is no evidence of pneumothorax. Borderline enlarged mediastinal nodes are seen, measuring up to 1.1 cm in short axis, at the right paratracheal and subcarinal regions. No pericardial effusion is identified. The great vessels are grossly unremarkable in appearance. No axillary lymphadenopathy is seen. The thyroid gland is not well characterized on this study. The visualized portions of the liver and spleen are unremarkable. Postoperative change is noted at the stomach. No acute osseous abnormalities are seen. Review of the MIP images confirms the above findings. IMPRESSION: 1. No evidence of pulmonary embolus. 2. Numerous masses throughout the lungs bilaterally, measuring up to 5.0 cm in size, most prominent peripherally. This is suspicious for metastatic disease, whether from an unknown primary or a lung primary, though an inflammatory condition might have a similar appearance. Biopsy is recommended for further evaluation, as deemed clinically appropriate. 3. Trace left pleural effusion noted. 4. Borderline enlarged mediastinal nodes, measuring up to 1.1 cm in short axis. Electronically Signed   By: Garald Balding M.D.   On: 01/21/2016 22:19   I have personally reviewed and evaluated these images and lab results as part of my medical decision-making.   EKG Interpretation   Date/Time:  Friday Jan 21 2016 19:12:11 EDT Ventricular Rate:  66 PR Interval:  162 QRS Duration: 109 QT Interval:  388 QTC Calculation: 406 R Axis:   -3 Text Interpretation:  Sinus rhythm Low voltage, precordial leads  Otherwise  normal ECG No previous tracing Confirmed by KNOTT MD, DANIEL ([id number]) on  01/21/2016 7:39:24 PM      MDM   Final diagnoses:  Positive D dimer  Abnormal CT scan of lung  Multiple lung nodules on CT    Patient is afebrile and non-toxic appearing. Viral signs are stable. Physical exam unremarkable. BMP remarkable for mildly elevated serum creatinine; however, patient has history of renal insufficiency and review of records indicate creatinine stable. CBC remarkable for mild anemia at 10.9; review of records indicate history of anemia and lab is stable when compared to others. Troponin negative. EKG shows sinus rhythm with low voltage. Heart score 2. D-dimer positive.  CT negative for pulmonary embolus. CT remarkable for multiple masses in lungs b/l with borderline enlarged mediastinal nodes, suspicious for malignancy.    Sent message to on-call oncologist, Dr. Benay Spice, regarding CT results. Discussed CT results with patient and emphasized the importance of following up with oncology as soon as possible for further evaluation. Provided contact information for oncology. Follow up with PCP as needed. Patient voiced understanding and is agreeable.     Roxanna Mew, PA-C 01/22/16 0221  Leo Grosser, MD 01/22/16 [phone]

## 2016-01-21 NOTE — Telephone Encounter (Signed)
Talked with Ct tech. She advise ct angio with contrast to evaluate PE and lung mass.

## 2016-01-21 NOTE — ED Notes (Signed)
Pt sent here from PMD office for SOB and Positive d deimer

## 2016-01-21 NOTE — Patient Instructions (Signed)
For your cough, subjective fever and sweats will get chest xray and cbc. I think it is best to start you on azithromycin for bronchitis or walking pneumonia.  For cough rx  Benzonatate. You could take otc ibuprofen for pleuritic type pain.  For sneezing reported could take flonase.  Also getting a d-dimer today.If this test is +. Then will need to get ct of the chest to evaluate if pulmonary embolism present.  Follow up in 7 days or as needed

## 2016-01-21 NOTE — ED Notes (Signed)
EKG was done upon pt arrival, but was never clicked off.

## 2016-01-21 NOTE — Telephone Encounter (Signed)
I was present when  Stephanie Pierce was talking with pt. She advised to go to ED now. Explain reason to front desk is elevated d-dimer and painful breathing. Told her to go now and not wait.

## 2016-01-21 NOTE — Discharge Instructions (Signed)
Your CT scan is concerning for malignancy. It is imperative that you call and follow up with Dr. Benay Spice, the oncologist, for further evaluation.  You may return to the Emergency Department at any time for worsening condition or any new symptoms that concern you.

## 2016-01-21 NOTE — Telephone Encounter (Signed)
Spoke with pt and she state that she will go to the ER downstairs from the office and she will advise the ER of the painful breathing and the elevated DVT. Pt did not have any further questions.

## 2016-01-26 ENCOUNTER — Other Ambulatory Visit (HOSPITAL_BASED_OUTPATIENT_CLINIC_OR_DEPARTMENT_OTHER): Payer: PRIVATE HEALTH INSURANCE

## 2016-01-26 ENCOUNTER — Ambulatory Visit (HOSPITAL_BASED_OUTPATIENT_CLINIC_OR_DEPARTMENT_OTHER): Payer: PRIVATE HEALTH INSURANCE | Admitting: Hematology & Oncology

## 2016-01-26 ENCOUNTER — Encounter: Payer: Self-pay | Admitting: Hematology & Oncology

## 2016-01-26 VITALS — BP 125/75 | HR 78 | Temp 98.2°F | Resp 16 | Ht 67.0 in | Wt 195.0 lb

## 2016-01-26 DIAGNOSIS — R918 Other nonspecific abnormal finding of lung field: Secondary | ICD-10-CM | POA: Diagnosis not present

## 2016-01-26 DIAGNOSIS — D0511 Intraductal carcinoma in situ of right breast: Secondary | ICD-10-CM

## 2016-01-26 DIAGNOSIS — E559 Vitamin D deficiency, unspecified: Secondary | ICD-10-CM

## 2016-01-26 LAB — COMPREHENSIVE METABOLIC PANEL
ALBUMIN: 3.3 g/dL — AB (ref 3.5–5.0)
ALK PHOS: 60 U/L (ref 40–150)
ALT: 17 U/L (ref 0–55)
ANION GAP: 10 meq/L (ref 3–11)
AST: 20 U/L (ref 5–34)
BILIRUBIN TOTAL: 0.53 mg/dL (ref 0.20–1.20)
BUN: 22.1 mg/dL (ref 7.0–26.0)
CO2: 23 meq/L (ref 22–29)
CREATININE: 1.6 mg/dL — AB (ref 0.6–1.1)
Calcium: 9.5 mg/dL (ref 8.4–10.4)
Chloride: 107 mEq/L (ref 98–109)
EGFR: 38 mL/min/{1.73_m2} — AB (ref >90)
Glucose: 95 mg/dl (ref 70–140)
Potassium: 4.4 mEq/L (ref 3.5–5.1)
Sodium: 140 mEq/L (ref 136–145)
TOTAL PROTEIN: 8.2 g/dL (ref 6.4–8.3)

## 2016-01-26 LAB — CBC WITH DIFFERENTIAL (CANCER CENTER ONLY)
BASO#: 0 10*3/uL (ref 0.0–0.2)
BASO%: 0.3 % (ref 0.0–2.0)
EOS ABS: 0.1 10*3/uL (ref 0.0–0.5)
EOS%: 2.1 % (ref 0.0–7.0)
HEMATOCRIT: 31.5 % — AB (ref 34.8–46.6)
HEMOGLOBIN: 10.5 g/dL — AB (ref 11.6–15.9)
LYMPH#: 1 10*3/uL (ref 0.9–3.3)
LYMPH%: 16.7 % (ref 14.0–48.0)
MCH: 28 pg (ref 26.0–34.0)
MCHC: 33.3 g/dL (ref 32.0–36.0)
MCV: 84 fL (ref 81–101)
MONO#: 0.6 10*3/uL (ref 0.1–0.9)
MONO%: 9.9 % (ref 0.0–13.0)
NEUT#: 4.4 10*3/uL (ref 1.5–6.5)
NEUT%: 71 % (ref 39.6–80.0)
PLATELETS: 265 10*3/uL (ref 145–400)
RBC: 3.75 10*6/uL (ref 3.70–5.32)
RDW: 12.6 % (ref 11.1–15.7)
WBC: 6.2 10*3/uL (ref 3.9–10.0)

## 2016-01-26 NOTE — Progress Notes (Signed)
Hematology and Oncology Follow Up Visit  Stephanie Pierce [id number] 08-27-1951 65 y.o. 01/26/2016   Principle Diagnosis:   Ductal carcinoma in situ of the right breast  Bilateral pulmonary masses  Current Therapy:    Observation     Interim History:  Stephanie Pierce is back for follow-up. This is an early follow-up for her. I'm absolutely surprised as to what is transpired. She went to emergency room over the weekend. She's having some shortness of breath. She had a CT angiogram done. Surprisingly enough, this showed some scattered masses throughout the lungs. The largest measured 52 m. Another measured 4.3 cm. She had nonpathologic hilar and mediastinal lymph nodes. There is no effusion. Liver looked okay. There are no osseous changes. She had no pulmonary embolism.  She was told that "you have cancer" and that she is to see me.  She actually brought in CAT scan reports that were done back a few years ago. In May 2014, she had a CT scan which showed some multiple bilateral groundglass opacities in both lungs. This was noted back in 2009. She did not have any lymphadenopathy.  She feels well. She's had no fever. She had one episode of hemoptysis but this was only blood tinged sputum.  She's had some weight loss because she is not eating because of this lung situation right now. Per she's had no diarrhea. She's had no rashes.  She's had no joint issues.  She's not traveled recently. She's had no contact with anybody sick.  Of note, as part of her [employer], she was in [place].  Overall, her performance status is ECOG 0.  Medications:  Current outpatient prescriptions:  .  azithromycin (ZITHROMAX) 250 MG tablet, Take 2 tablets by mouth on day 1, followed by 1 tablet by mouth daily for 4 days., Disp: 6 tablet, Rfl: 0 .  benzonatate (TESSALON) 100 MG capsule, Take 1 capsule (100 mg total) by mouth 3 (three) times daily as needed., Disp: 21 capsule, Rfl: 0 .  Calcium  Citrate-Vitamin D (CALCIUM CITRATE CHEWY BITE) 500-500 MG-UNIT CHEW, Chew by mouth daily., Disp: , Rfl:  .  Cholecalciferol (VITAMIN D3) 10000 UNITS capsule, Take 10,000 Units by mouth every other day., Disp: , Rfl:  .  Cyanocobalamin (B-12) 2500 MCG TABS, Take 1 tablet by mouth once a week., Disp: , Rfl:  .  fluticasone (FLONASE) 50 MCG/ACT nasal spray, Place 2 sprays into both nostrils daily., Disp: 16 g, Rfl: 1 .  KRILL OIL OMEGA-3 PO, Take by mouth. Take 2 daily, Disp: , Rfl:  .  losartan (COZAAR) 100 MG tablet, Take 1 tablet (100 mg total) by mouth daily., Disp: 90 tablet, Rfl: 3 .  montelukast (SINGULAIR) 10 MG tablet, Take 1 tablet (10 mg total) by mouth at bedtime as needed., Disp: 90 tablet, Rfl: 1 .  SYNTHROID 112 MCG tablet, Take 1 tablet by mouth  daily, Disp: 90 tablet, Rfl: 1 .  Multiple Vitamins-Minerals (CENTRUM SILVER ADULT 50+ PO), Take 1 tablet by mouth every other day., Disp: , Rfl:   Allergies:  Allergies  Allergen Reactions  . Lisinopril Swelling    Swelling in the throat  . Penicillins Rash    Past Medical History, Surgical history, Social history, and Family History were reviewed and updated.  Review of Systems: As above  Physical Exam:  height is '5\' 7"'$  (1.702 m) and weight is 195 lb (88.451 kg). Her oral temperature is 98.2 F (36.8 C). Her blood pressure is 125/75 and her  pulse is 78. Her respiration is 16.   Wt Readings from Last 3 Encounters:  01/26/16 195 lb (88.451 kg)  01/21/16 197 lb (89.359 kg)  01/21/16 196 lb 9.6 oz (89.177 kg)     Well-developed and well-nourished African-American female in no obvious distress. Head and neck exam shows no ocular or oral lesions. She has no palpable cervical or supraclavicular lymph nodes. Lungs are clear bilaterally. I hear no rales, wheezes or rhonchi. She has good air movement in both lungs. Cardiac exam regular rate and rhythm with no murmurs, rubs or bruits. Abdomen is soft. She has good bowel sounds. There is  no fluid wave. There is no palpable liver or spleen tip. Breast exam shows left breast with no masses, edema or erythema. There is no left axillary adenopathy. Right breast shows the well-healed lumpectomy at about the 8:00 position. There is some firmness at the lumpectomy site. No distinct masses noted in the right breast. There is no right axillary adenopathy. Back exam shows no tenderness over the spine, ribs or hips. Extremities shows no clubbing, cyanosis or edema. She has no lymphedema of the right arm. Skin exam shows no rashes, ecchymoses or petechia. Neurological exam shows no focal neurological deficits.  Lab Results  Component Value Date   WBC 6.2 01/26/2016   HGB 10.5* 01/26/2016   HCT 31.5* 01/26/2016   MCV 84 01/26/2016   PLT 265 01/26/2016     Chemistry      Component Value Date/Time   NA 137 01/21/2016 1818   NA 140 11/22/2015 0743   K 3.8 01/21/2016 1818   K 4.1 11/22/2015 0743   CL 103 01/21/2016 1818   CO2 26 01/21/2016 1818   CO2 24 11/22/2015 0743   BUN 29* 01/21/2016 1818   BUN 37.4* 11/22/2015 0743   CREATININE 1.59* 01/21/2016 1818   CREATININE 1.5* 11/22/2015 0743      Component Value Date/Time   CALCIUM 9.3 01/21/2016 1818   CALCIUM 9.2 11/22/2015 0743   ALKPHOS 66 11/22/2015 0743   ALKPHOS 69 07/28/2015 0851   AST 28 11/22/2015 0743   AST 22 07/28/2015 0851   ALT 23 11/22/2015 0743   ALT 20 07/28/2015 0851   BILITOT 0.49 11/22/2015 0743   BILITOT 0.5 07/28/2015 0851         Impression and Plan: [name] is A 65 year old African-American female with DCIS of the right breast. She was diagnosed back in July 2015. She elected to forego any hormonal therapy.  I'm not sure what exactly is going on with her lungs. She is not a smoker. She has never smoked. On her past scans, report seems to suggest that she has had these opacities in the past.  I just cannot imagine that this is malignant. I suppose bronchoalveolar carcinoma is a possibility given  that she is a non-smoker.  Her lungs sound great. She looks good. She feels good. She's exercising. She's running.  I think before we start putting needles in her, we really should repeat the CT scan about 4-6 weeks. I think this is very reasonable. Again, I am very skeptical of her having cancer. I just don't think that she has an underlying malignancy in her lungs.  I told her that she must call us if she starts having any further problems. She starts coughing up more blood, has shortness of breath, has a high fever, has chest wall pain, then she is to let us know.  I spent about 40 minutes with  her today. I try to reassure her as much like could regarding the CT results.  Again, I just feel bad that she was told that she had cancer without any documentation from a tissue diagnosis.  I will see her back the first week in July. We will get the CAT scan the same that we see her.    Nile Riggs, MD 5/31/20172:12 PM

## 2016-01-27 ENCOUNTER — Ambulatory Visit: Payer: PRIVATE HEALTH INSURANCE | Admitting: Hematology & Oncology

## 2016-01-27 ENCOUNTER — Other Ambulatory Visit: Payer: PRIVATE HEALTH INSURANCE

## 2016-01-27 LAB — VITAMIN D 25 HYDROXY (VIT D DEFICIENCY, FRACTURES): Vitamin D, 25-Hydroxy: 38.6 ng/mL (ref 30.0–100.0)

## 2016-02-03 ENCOUNTER — Encounter: Payer: Self-pay | Admitting: Family Medicine

## 2016-02-03 ENCOUNTER — Ambulatory Visit (INDEPENDENT_AMBULATORY_CARE_PROVIDER_SITE_OTHER): Payer: PRIVATE HEALTH INSURANCE | Admitting: Family Medicine

## 2016-02-03 VITALS — BP 112/68 | HR 88 | Temp 98.0°F | Ht 67.0 in | Wt 193.5 lb

## 2016-02-03 DIAGNOSIS — H547 Unspecified visual loss: Secondary | ICD-10-CM

## 2016-02-03 DIAGNOSIS — E663 Overweight: Secondary | ICD-10-CM

## 2016-02-03 DIAGNOSIS — N289 Disorder of kidney and ureter, unspecified: Secondary | ICD-10-CM

## 2016-02-03 DIAGNOSIS — E782 Mixed hyperlipidemia: Secondary | ICD-10-CM

## 2016-02-03 DIAGNOSIS — Z23 Encounter for immunization: Secondary | ICD-10-CM | POA: Diagnosis not present

## 2016-02-03 DIAGNOSIS — T7840XD Allergy, unspecified, subsequent encounter: Secondary | ICD-10-CM

## 2016-02-03 DIAGNOSIS — E039 Hypothyroidism, unspecified: Secondary | ICD-10-CM

## 2016-02-03 DIAGNOSIS — E559 Vitamin D deficiency, unspecified: Secondary | ICD-10-CM

## 2016-02-03 NOTE — Patient Instructions (Signed)
Chronic Kidney Disease °Chronic kidney disease occurs when the kidneys are damaged over a long period. The kidneys are two organs that lie on either side of the spine between the middle of the back and the front of the abdomen. The kidneys: °· Remove wastes and extra water from the blood. °· Produce important hormones. These help keep bones strong, regulate blood pressure, and help create red blood cells. °· Balance the fluids and chemicals in the blood and tissues. °A small amount of kidney damage may not cause problems, but a large amount of damage may make it difficult or impossible for the kidneys to work the way they should. If steps are not taken to slow down the kidney damage or stop it from getting worse, the kidneys may stop working permanently. Most of the time, chronic kidney disease does not go away. However, it can often be controlled, and those with the disease can usually live normal lives. °CAUSES °The most common causes of chronic kidney disease are diabetes and high blood pressure (hypertension). Chronic kidney disease may also be caused by: °· Diseases that cause the kidneys' filters to become inflamed. °· Diseases that affect the immune system. °· Genetic diseases. °· Medicines that damage the kidneys, such as anti-inflammatory medicines. °· Poisoning or exposure to toxic substances. °· A reoccurring kidney or urinary infection. °· A problem with urine flow. This may be caused by: °¨ Cancer. °¨ Kidney stones. °¨ An enlarged prostate in males. °SIGNS AND SYMPTOMS °Because the kidney damage in chronic kidney disease occurs slowly, symptoms develop slowly and may not be obvious until the kidney damage becomes severe. A person may have a kidney disease for years without showing any symptoms. Symptoms can include: °· Swelling (edema) of the legs, ankles, or feet. °· Tiredness (lethargy). °· Nausea or vomiting. °· Confusion. °· Problems with urination, such as: °¨ Decreased urine  production. °¨ Frequent urination, especially at night. °¨ Frequent accidents in children who are potty trained. °· Muscle twitches and cramps. °· Shortness of breath. °· Weakness. °· Persistent itchiness. °· Loss of appetite. °· Metallic taste in the mouth. °· Trouble sleeping. °· Slowed development in children. °· Short stature in children. °DIAGNOSIS °Chronic kidney disease may be detected and diagnosed by tests, including blood, urine, imaging, or kidney biopsy tests. °TREATMENT °Most chronic kidney diseases cannot be cured. Treatment usually involves relieving symptoms and preventing or slowing the progression of the disease. Treatment may include: °· A special diet. You may need to avoid alcohol and foods that are salty and high in potassium. °· Medicines. These may: °¨ Lower blood pressure. °¨ Relieve anemia. °¨ Relieve swelling. °¨ Protect the bones. °HOME CARE INSTRUCTIONS °· Follow your prescribed diet.  Your health care provider may instruct you to limit daily salt (sodium) and protein intake. °· Take medicines only as directed by your health care provider. Do not take any new medicines (prescription, over-the-counter, or nutritional supplements) unless approved by your health care provider. Many medicines can worsen your kidney damage or need to have the dose adjusted.   °· Quit smoking if you smoke. Talk to your health care provider about a smoking cessation program. °· Keep all follow-up visits as directed by your health care provider. °· Monitor your blood pressure. °· Start or continue an exercise plan. °· Get immunizations as directed by your health care provider. °· Take vitamin and mineral supplements as directed by your health care provider. °SEEK IMMEDIATE MEDICAL CARE IF: °· Your symptoms get worse or you develop   new symptoms. °· You develop symptoms of end-stage kidney disease. These include: °¨ Headaches. °¨ Abnormally dark or light skin. °¨ Numbness in the hands or feet. °¨ Easy  bruising. °¨ Frequent hiccups. °¨ Menstruation stops. °· You have a fever. °· You have decreased urine production. °· You have pain or bleeding when urinating. °MAKE SURE YOU: °· Understand these instructions. °· Will watch your condition. °· Will get help right away if you are not doing well or get worse. °FOR MORE INFORMATION  °· American Association of Kidney Patients: www.aakp.org °· National Kidney Foundation: www.kidney.org °· American Kidney Fund: www.akfinc.org °· [employer] [facility]: www.lifeoptions.org and www.kidneyschool.org °  °This information is not intended to replace advice given to you by your health care provider. Make sure you discuss any questions you have with your health care provider. °  °Document Released: 05/23/2008 Document Revised: 09/04/2014 Document Reviewed: 04/12/2012 °Elsevier Interactive Patient Education ©2016 Elsevier Inc. ° °

## 2016-02-03 NOTE — Progress Notes (Signed)
Pre visit review using our clinic review tool, if applicable. No additional management support is needed unless otherwise documented below in the visit note. 

## 2016-02-04 ENCOUNTER — Encounter: Payer: Self-pay | Admitting: Family Medicine

## 2016-02-13 NOTE — Assessment & Plan Note (Signed)
Encouraged heart healthy diet, increase exercise, avoid trans fats, consider a krill oil cap daily 

## 2016-02-13 NOTE — Assessment & Plan Note (Signed)
Encouraged DASH diet, decrease po intake and increase exercise as tolerated. Needs 7-8 hours of sleep nightly. Avoid trans fats, eat small, frequent meals every 4-5 hours with lean proteins, complex carbs and healthy fats 

## 2016-02-13 NOTE — Assessment & Plan Note (Signed)
WNL with supplements continue the same

## 2016-02-13 NOTE — Assessment & Plan Note (Signed)
May use Singulair and Cetirizine prn

## 2016-02-13 NOTE — Progress Notes (Signed)
Patient ID: Stephanie Pierce, female   DOB: 05/24/1951, 65 y.o.   MRN: [id number]   Subjective:    Patient ID: Stephanie Pierce, female    DOB: [birth year], 65 y.o.   MRN: [id number]  Chief Complaint  Patient presents with  . Follow-up    HPI Patient is in today for follow up. Is generally doing well  But hsa noted some cough and allergic symptoms. Notes some nasal congestion and PND. No fevers or chills. Cough was bad enough to cause CP with coughing last month but it is better today. Denies CP/palp/SOB/HA/fevers/GI or GU c/o. Taking meds as prescribed  Past Medical History  Diagnosis Date  . Pneumonia 2012  . DCIS (ductal carcinoma in situ) of breast   . Enlarged thyroid gland   . Breast fibrocystic disorder   . Hyperlipidemia, mixed 03/23/2015  . Renal insufficiency 03/23/2015  . S/P gastric surgery 03/23/2015    Gastric sleeve  . Overweight 03/23/2015  . Allergic state 03/23/2015  . History of chicken pox 03/23/2015  . DCIS (ductal carcinoma in situ) of breast March 2015    right  . Hypothyroidism 03/23/2015  . H/O iron deficiency anemia 03/23/2015  . Allergy   . Cancer ([place])   . Vitamin D deficiency 05/12/2015    Past Surgical History  Procedure Laterality Date  . Cesarean section  1985  . Laparoscopic gastric sleeve resection    . Breast lumpectomy      h/o 2 needle biopsy, in 2015 right Lumpectomy DCIS stage 0    Family History  Problem Relation Age of Onset  . Hypertension Father   . Heart attack Father   . Alcohol abuse Father   . Diabetes Brother   . Other Brother     complications of exposure to agent orange  . Stroke Brother   . Diverticulosis Daughter   . Diabetes Sister   . Hypertension Sister     Social History   Social History  . Marital Status: Divorced    Spouse Name: N/A  . Number of Children: N/A  . Years of Education: 93   Occupational History  . RN Case Manager    Social History Main Topics  . Smoking status: Never Smoker   .  Smokeless tobacco: Not on file  . Alcohol Use: No  . Drug Use: No  . 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 Not on file   Social History Narrative    Outpatient Prescriptions Prior to Visit  Medication Sig Dispense Refill  . Calcium Citrate-Vitamin D (CALCIUM CITRATE CHEWY BITE) 500-500 MG-UNIT CHEW Chew by mouth daily.    . Cholecalciferol (VITAMIN D3) 10000 UNITS capsule Take 10,000 Units by mouth every other day.    . Cyanocobalamin (B-12) 2500 MCG TABS Take 1 tablet by mouth once a week.    . fluticasone (FLONASE) 50 MCG/ACT nasal spray Place 2 sprays into both nostrils daily. 16 g 1  . KRILL OIL OMEGA-3 PO Take by mouth. Take 2 daily    . losartan (COZAAR) 100 MG tablet Take 1 tablet (100 mg total) by mouth daily. 90 tablet 3  . montelukast (SINGULAIR) 10 MG tablet Take 1 tablet (10 mg total) by mouth at bedtime as needed. 90 tablet 1  . Multiple Vitamins-Minerals (CENTRUM SILVER ADULT 50+ PO) Take 1 tablet by  mouth every other day.    Marland Kitchen SYNTHROID 112 MCG tablet Take 1 tablet by mouth  daily 90 tablet 1  . azithromycin (ZITHROMAX) 250 MG tablet Take 2 tablets by mouth on day 1, followed by 1 tablet by mouth daily for 4 days. 6 tablet 0  . benzonatate (TESSALON) 100 MG capsule Take 1 capsule (100 mg total) by mouth 3 (three) times daily as needed. 21 capsule 0   No facility-administered medications prior to visit.    Allergies  Allergen Reactions  . Lisinopril Swelling    Swelling in the throat  . Penicillins Rash    Review of Systems  Constitutional: Negative for fever and malaise/fatigue.  HENT: Positive for congestion.   Eyes: Negative for blurred vision.  Respiratory: Positive for cough and sputum production. Negative for shortness of breath.   Cardiovascular: Negative for chest pain, palpitations and leg swelling.    Gastrointestinal: Negative for nausea, abdominal pain and blood in stool.  Genitourinary: Negative for dysuria and frequency.  Musculoskeletal: Negative for falls.  Skin: Negative for rash.  Neurological: Negative for dizziness, loss of consciousness and headaches.  Endo/Heme/Allergies: Negative for environmental allergies.  Psychiatric/Behavioral: Negative for depression. The patient is not nervous/anxious.        Objective:    Physical Exam  Constitutional: She is oriented to person, place, and time. She appears well-developed and well-nourished. No distress.  HENT:  Head: Normocephalic and atraumatic.  Nose: Nose normal.  Eyes: Right eye exhibits no discharge. Left eye exhibits no discharge.  Neck: Normal range of motion. Neck supple.  Cardiovascular: Normal rate and regular rhythm.   No murmur heard. Pulmonary/Chest: Effort normal and breath sounds normal.  Abdominal: Soft. Bowel sounds are normal. There is no tenderness.  Musculoskeletal: She exhibits no edema.  Neurological: She is alert and oriented to person, place, and time.  Skin: Skin is warm and dry.  Psychiatric: She has a normal mood and affect.  Nursing note and vitals reviewed.   BP 112/68 mmHg  Pulse 88  Temp(Src) 98 F (36.7 C) (Oral)  Ht _0  (1.702 m)  Wt 193 lb 8 oz (87.771 kg)  BMI 30.30 kg/m2  SpO2 95% Wt Readings from Last 3 Encounters:  02/03/16 193 lb 8 oz (87.771 kg)  01/26/16 195 lb (88.451 kg)  01/21/16 197 lb (89.359 kg)     Lab Results  Component Value Date   WBC 6.2 01/26/2016   HGB 10.5* 01/26/2016   HCT 31.5* 01/26/2016   PLT 265 01/26/2016   GLUCOSE 95 01/26/2016   CHOL 219* 07/28/2015   TRIG 34.0 07/28/2015   HDL 101.90 07/28/2015   LDLCALC 110* 07/28/2015   ALT 17 01/26/2016   AST 20 01/26/2016   NA 140 01/26/2016   K 4.4 01/26/2016   CL 103 01/21/2016   CREATININE 1.6* 01/26/2016   BUN 22.1 01/26/2016   CO2 23 01/26/2016   TSH 1.35 10/27/2015    Lab Results   Component Value Date   TSH 1.35 10/27/2015   Lab Results  Component Value Date   WBC 6.2 01/26/2016   HGB 10.5* 01/26/2016   HCT 31.5* 01/26/2016   MCV 84 01/26/2016   PLT 265 01/26/2016   Lab Results  Component Value Date   NA 140 01/26/2016   K 4.4 01/26/2016   CHLORIDE 107 01/26/2016   CO2 23 01/26/2016   GLUCOSE 95 01/26/2016   BUN 22.1 01/26/2016   CREATININE 1.6* 01/26/2016   BILITOT 0.53 01/26/2016  ALKPHOS 60 01/26/2016   AST 20 01/26/2016   ALT 17 01/26/2016   PROT 8.2 01/26/2016   ALBUMIN 3.3* 01/26/2016   CALCIUM 9.5 01/26/2016   ANIONGAP 10 01/26/2016   EGFR 38* 01/26/2016   GFR 43.38* 07/28/2015   Lab Results  Component Value Date   CHOL 219* 07/28/2015   Lab Results  Component Value Date   HDL 101.90 07/28/2015   Lab Results  Component Value Date   LDLCALC 110* 07/28/2015   Lab Results  Component Value Date   TRIG 34.0 07/28/2015   Lab Results  Component Value Date   CHOLHDL 2 07/28/2015   No results found for: HGBA1C     Assessment & Plan:   Problem List Items Addressed This Visit    Vitamin D deficiency (Chronic)    WNL with supplements continue the same      Relevant Orders   TSH   CBC   VITAMIN D 25 Hydroxy (Vit-D Deficiency, Fractures)   Comprehensive metabolic panel   Lipid panel   Renal insufficiency   Relevant Orders   TSH   CBC   VITAMIN D 25 Hydroxy (Vit-D Deficiency, Fractures)   Comprehensive metabolic panel   Lipid panel   Overweight    Encouraged DASH diet, decrease po intake and increase exercise as tolerated. Needs 7-8 hours of sleep nightly. Avoid trans fats, eat small, frequent meals every 4-5 hours with lean proteins, complex carbs and healthy fats.       Relevant Orders   TSH   CBC   VITAMIN D 25 Hydroxy (Vit-D Deficiency, Fractures)   Comprehensive metabolic panel   Lipid panel   Hypothyroidism - Primary    On Levothyroxine, continue to monitor      Relevant Orders   TSH   CBC   VITAMIN D  25 Hydroxy (Vit-D Deficiency, Fractures)   Comprehensive metabolic panel   Lipid panel   Hyperlipidemia, mixed    Encouraged heart healthy diet, increase exercise, avoid trans fats, consider a krill oil cap daily      Relevant Orders   TSH   CBC   VITAMIN D 25 Hydroxy (Vit-D Deficiency, Fractures)   Comprehensive metabolic panel   Lipid panel   Allergic state    May use Singulair and Cetirizine prn       Other Visit Diagnoses    Decreased visual acuity        Relevant Orders    Ambulatory referral to Ophthalmology    Need for diphtheria-tetanus-pertussis (Tdap) vaccine, adult/adolescent        Relevant Orders    Tdap vaccine greater than or equal to 7yo IM (Completed)    Need for vaccination with 13-polyvalent pneumococcal conjugate vaccine        Relevant Orders    Pneumococcal conjugate vaccine 13-valent (Completed)       I have discontinued Ms. Counterman's azithromycin and benzonatate. I am also having her maintain her Multiple Vitamins-Minerals (CENTRUM SILVER ADULT 50+ PO), B-12, Vitamin D3, Calcium Citrate-Vitamin D, KRILL OIL OMEGA-3 PO, montelukast, losartan, SYNTHROID, and fluticasone.  No orders of the defined types were placed in this encounter.     Penni Homans, MD

## 2016-02-13 NOTE — Assessment & Plan Note (Signed)
On Levothyroxine, continue to monitor 

## 2016-03-02 ENCOUNTER — Ambulatory Visit (HOSPITAL_BASED_OUTPATIENT_CLINIC_OR_DEPARTMENT_OTHER)
Admission: RE | Admit: 2016-03-02 | Discharge: 2016-03-02 | Disposition: A | Discharge status: Home / Self Care | Payer: PRIVATE HEALTH INSURANCE | Source: Ambulatory Visit | Attending: Hematology & Oncology | Admitting: Hematology & Oncology

## 2016-03-02 ENCOUNTER — Other Ambulatory Visit (HOSPITAL_BASED_OUTPATIENT_CLINIC_OR_DEPARTMENT_OTHER): Payer: PRIVATE HEALTH INSURANCE

## 2016-03-02 ENCOUNTER — Ambulatory Visit (HOSPITAL_BASED_OUTPATIENT_CLINIC_OR_DEPARTMENT_OTHER): Payer: PRIVATE HEALTH INSURANCE | Admitting: Hematology & Oncology

## 2016-03-02 ENCOUNTER — Encounter: Payer: Self-pay | Admitting: Hematology & Oncology

## 2016-03-02 VITALS — BP 137/67 | HR 60 | Temp 97.7°F | Resp 16 | Ht 67.0 in | Wt 193.0 lb

## 2016-03-02 DIAGNOSIS — D0501 Lobular carcinoma in situ of right breast: Secondary | ICD-10-CM

## 2016-03-02 DIAGNOSIS — R918 Other nonspecific abnormal finding of lung field: Secondary | ICD-10-CM | POA: Diagnosis present

## 2016-03-02 DIAGNOSIS — D508 Other iron deficiency anemias: Secondary | ICD-10-CM

## 2016-03-02 DIAGNOSIS — D0511 Intraductal carcinoma in situ of right breast: Secondary | ICD-10-CM | POA: Diagnosis not present

## 2016-03-02 DIAGNOSIS — Z9889 Other specified postprocedural states: Secondary | ICD-10-CM

## 2016-03-02 LAB — CBC WITH DIFFERENTIAL (CANCER CENTER ONLY)
BASO#: 0 10*3/uL (ref 0.0–0.2)
BASO%: 0.7 % (ref 0.0–2.0)
EOS%: 7.2 % — AB (ref 0.0–7.0)
Eosinophils Absolute: 0.3 10*3/uL (ref 0.0–0.5)
HEMATOCRIT: 31.3 % — AB (ref 34.8–46.6)
HGB: 10.4 g/dL — ABNORMAL LOW (ref 11.6–15.9)
LYMPH#: 1.2 10*3/uL (ref 0.9–3.3)
LYMPH%: 29.9 % (ref 14.0–48.0)
MCH: 27.4 pg (ref 26.0–34.0)
MCHC: 33.2 g/dL (ref 32.0–36.0)
MCV: 83 fL (ref 81–101)
MONO#: 0.6 10*3/uL (ref 0.1–0.9)
MONO%: 15.6 % — ABNORMAL HIGH (ref 0.0–13.0)
NEUT#: 1.9 10*3/uL (ref 1.5–6.5)
NEUT%: 46.6 % (ref 39.6–80.0)
PLATELETS: 179 10*3/uL (ref 145–400)
RBC: 3.79 10*6/uL (ref 3.70–5.32)
RDW: 13.9 % (ref 11.1–15.7)
WBC: 4.1 10*3/uL (ref 3.9–10.0)

## 2016-03-02 LAB — FERRITIN: Ferritin: 95 ng/ml (ref 9–269)

## 2016-03-02 LAB — IRON AND TIBC
%SAT: 17 % — ABNORMAL LOW (ref 21–57)
IRON: 50 ug/dL (ref 41–142)
TIBC: 286 ug/dL (ref 236–444)
UIBC: 236 ug/dL (ref 120–384)

## 2016-03-02 LAB — CHCC SATELLITE - SMEAR

## 2016-03-02 NOTE — Progress Notes (Signed)
Hematology and Oncology Follow Up Visit  Morghan Kester [id number] April 10, 1951 65 y.o. 03/02/2016   Principle Diagnosis:   Ductal carcinoma in situ of the right breast  Bilateral pulmonary masses  Current Therapy:    Observation     Interim History:  Ms. Sur is back for follow-up. She still feels okay.  We did go ahead and get a CT scan of her chest today. This showed multiple pulmonary nodules. However, they appeared to be somewhat smaller in size.  I have to think that this is not malignant but might be a fungal type infection. She was back in [place]. I think that it is possible this might represent an endemic fungal infection that people can get in [place].  She does not want any further workup right now. She is not on a biopsy. She is running. She is asymptomatic. She's had no cough. His been no bleeding. I think this is reasonable. We will have to follow up in another couple months however.   She's not noted any bleeding. She does have a gastric sleeve that she does not absorb iron well. I told her to take some oral iron.   She's had no issues with rashes. She's had no leg swelling. She's had no change in bowel or bladder habits.   Overall, her performance status is ECOG 0.  Medications:  Current outpatient prescriptions:  .  Calcium Citrate-Vitamin D (CALCIUM CITRATE CHEWY BITE) 500-500 MG-UNIT CHEW, Chew by mouth daily., Disp: , Rfl:  .  Cholecalciferol (VITAMIN D3) 10000 UNITS capsule, Take 10,000 Units by mouth every other day., Disp: , Rfl:  .  Cyanocobalamin (B-12) 2500 MCG TABS, Take 1 tablet by mouth once a week., Disp: , Rfl:  .  fluticasone (FLONASE) 50 MCG/ACT nasal spray, Place 2 sprays into both nostrils daily., Disp: 16 g, Rfl: 1 .  KRILL OIL OMEGA-3 PO, Take by mouth. Take 2 daily, Disp: , Rfl:  .  losartan (COZAAR) 100 MG tablet, Take 1 tablet (100 mg total) by mouth daily., Disp: 90 tablet, Rfl: 3 .  montelukast (SINGULAIR) 10 MG tablet, Take 1  tablet (10 mg total) by mouth at bedtime as needed., Disp: 90 tablet, Rfl: 1 .  Multiple Vitamins-Minerals (CENTRUM SILVER ADULT 50+ PO), Take 1 tablet by mouth every other day., Disp: , Rfl:  .  SYNTHROID 112 MCG tablet, Take 1 tablet by mouth  daily, Disp: 90 tablet, Rfl: 1  Allergies:  Allergies  Allergen Reactions  . Lisinopril Swelling    Swelling in the throat  . Penicillins Rash    Past Medical History, Surgical history, Social history, and Family History were reviewed and updated.  Review of Systems: As above  Physical Exam:  height is '5\' 7"'$  (1.702 m) and weight is 193 lb (87.544 kg). Her oral temperature is 97.7 F (36.5 C). Her blood pressure is 137/67 and her pulse is 60. Her respiration is 16.   Wt Readings from Last 3 Encounters:  03/02/16 193 lb (87.544 kg)  02/03/16 193 lb 8 oz (87.771 kg)  01/26/16 195 lb (88.451 kg)     Well-developed and well-nourished African-American female in no obvious distress. Head and neck exam shows no ocular or oral lesions. She has no palpable cervical or supraclavicular lymph nodes. Lungs are clear bilaterally. I hear no rales, wheezes or rhonchi. She has good air movement in both lungs. Cardiac exam regular rate and rhythm with no murmurs, rubs or bruits. Abdomen is soft. She has good bowel  sounds. There is no fluid wave. There is no palpable liver or spleen tip. Breast exam shows left breast with no masses, edema or erythema. There is no left axillary adenopathy. Right breast shows the well-healed lumpectomy at about the 8:00 position. There is some firmness at the lumpectomy site. No distinct masses noted in the right breast. There is no right axillary adenopathy. Back exam shows no tenderness over the spine, ribs or hips. Extremities shows no clubbing, cyanosis or edema. She has no lymphedema of the right arm. Skin exam shows no rashes, ecchymoses or petechia. Neurological exam shows no focal neurological deficits.  Lab Results    Component Value Date   WBC 4.1 03/02/2016   HGB 10.4* 03/02/2016   HCT 31.3* 03/02/2016   MCV 83 03/02/2016   PLT 179 03/02/2016     Chemistry      Component Value Date/Time   NA 140 01/26/2016 1319   NA 137 01/21/2016 1818   K 4.4 01/26/2016 1319   K 3.8 01/21/2016 1818   CL 103 01/21/2016 1818   CO2 23 01/26/2016 1319   CO2 26 01/21/2016 1818   BUN 22.1 01/26/2016 1319   BUN 29* 01/21/2016 1818   CREATININE 1.6* 01/26/2016 1319   CREATININE 1.59* 01/21/2016 1818      Component Value Date/Time   CALCIUM 9.5 01/26/2016 1319   CALCIUM 9.3 01/21/2016 1818   ALKPHOS 60 01/26/2016 1319   ALKPHOS 69 07/28/2015 0851   AST 20 01/26/2016 1319   AST 22 07/28/2015 0851   ALT 17 01/26/2016 1319   ALT 20 07/28/2015 0851   BILITOT 0.53 01/26/2016 1319   BILITOT 0.5 07/28/2015 0851         Impression and Plan: Ms. Juma is A 65 year old African-American female with DCIS of the right breast. She was diagnosed back in July 2015. She elected to forego any hormonal therapy.  I'm not sure what exactly is going on with her lungs. She is not a smoker. She has never smoked. On her past scans, report seems to suggest that she has had these opacities in the past.  I just cannot imagine that this is malignant. I suppose bronchoalveolar carcinoma is a possibility given that she is a non-smoker.  I do wonder if she has coccidoidiomycosis.  This is an endemic fungal infection and parcel [place].  Her lungs sound great. She looks good. She feels good. She's exercising. She's running.  I think before we start putting needles in her, we really should repeat the CT scan about 2 months.. I think this is very reasonable. Again, I am very skeptical of her having cancer. I just don't think that she has an underlying malignancy in her lungs.  I told her that she must call us if she starts having any further problems. She starts coughing up more blood, has shortness of breath, has a high fever,  has chest wall pain, then she is to let us know.  I spent about 40 minutes with her today. I try to reassure her as much like could regarding the CT results.  I also told her to make sure that she takes iron. She was some oral iron. We'll follow up with her iron studies will see her back.   Nile Riggs, MD 7/6/201710:22 AM

## 2016-03-03 LAB — RETICULOCYTES: Reticulocyte Count: 0.7 % (ref 0.6–2.6)

## 2016-03-16 ENCOUNTER — Encounter: Payer: Self-pay | Admitting: Family Medicine

## 2016-03-16 ENCOUNTER — Other Ambulatory Visit: Payer: Self-pay | Admitting: Family Medicine

## 2016-03-16 MED ORDER — LEVOTHYROXINE SODIUM 112 MCG PO TABS: ORAL_TABLET | ORAL | Status: DC

## 2016-03-16 MED ORDER — LOSARTAN POTASSIUM 100 MG PO TABS: 100.0000 mg | ORAL_TABLET | Freq: Every day | ORAL | Status: DC

## 2016-03-16 MED FILL — LOSARTAN POTASSIUM 100 MG T: 100 | 30 days supply | Qty: 30 | Fill #0

## 2016-03-21 MED FILL — SYNTHROID 112 MCG TABLET: 112 | 30 days supply | Qty: 30 | Fill #2

## 2016-04-20 MED FILL — LOSARTAN POTASSIUM 100 MG T: 100 | 30 days supply | Qty: 30 | Fill #1

## 2016-05-04 ENCOUNTER — Ambulatory Visit (HOSPITAL_BASED_OUTPATIENT_CLINIC_OR_DEPARTMENT_OTHER): Payer: PRIVATE HEALTH INSURANCE

## 2016-05-04 ENCOUNTER — Other Ambulatory Visit: Payer: PRIVATE HEALTH INSURANCE

## 2016-05-04 ENCOUNTER — Ambulatory Visit: Payer: PRIVATE HEALTH INSURANCE | Admitting: Hematology & Oncology

## 2016-05-25 ENCOUNTER — Ambulatory Visit: Payer: PRIVATE HEALTH INSURANCE | Admitting: Hematology & Oncology

## 2016-05-25 ENCOUNTER — Other Ambulatory Visit: Payer: PRIVATE HEALTH INSURANCE

## 2016-05-25 MED FILL — LOSARTAN POTASSIUM 100 MG T: 100 | 30 days supply | Qty: 30 | Fill #2

## 2016-05-25 MED FILL — SYNTHROID 112 MCG TABLET: 112 | 30 days supply | Qty: 30 | Fill #3

## 2016-06-08 ENCOUNTER — Ambulatory Visit: Payer: PRIVATE HEALTH INSURANCE | Admitting: Hematology & Oncology

## 2016-06-08 ENCOUNTER — Ambulatory Visit (INDEPENDENT_AMBULATORY_CARE_PROVIDER_SITE_OTHER): Payer: PRIVATE HEALTH INSURANCE | Admitting: Behavioral Health

## 2016-06-08 ENCOUNTER — Ambulatory Visit (HOSPITAL_BASED_OUTPATIENT_CLINIC_OR_DEPARTMENT_OTHER): Payer: PRIVATE HEALTH INSURANCE | Admitting: Hematology & Oncology

## 2016-06-08 ENCOUNTER — Other Ambulatory Visit (HOSPITAL_BASED_OUTPATIENT_CLINIC_OR_DEPARTMENT_OTHER): Payer: PRIVATE HEALTH INSURANCE

## 2016-06-08 ENCOUNTER — Encounter: Payer: Self-pay | Admitting: Hematology & Oncology

## 2016-06-08 ENCOUNTER — Ambulatory Visit (HOSPITAL_BASED_OUTPATIENT_CLINIC_OR_DEPARTMENT_OTHER)
Admission: RE | Admit: 2016-06-08 | Discharge: 2016-06-08 | Disposition: A | Discharge status: Home / Self Care | Payer: PRIVATE HEALTH INSURANCE | Source: Ambulatory Visit | Attending: Hematology & Oncology | Admitting: Hematology & Oncology

## 2016-06-08 VITALS — BP 136/77 | HR 57 | Temp 97.3°F | Resp 18 | Ht 67.0 in | Wt 193.0 lb

## 2016-06-08 DIAGNOSIS — D0511 Intraductal carcinoma in situ of right breast: Secondary | ICD-10-CM

## 2016-06-08 DIAGNOSIS — Z23 Encounter for immunization: Secondary | ICD-10-CM | POA: Diagnosis not present

## 2016-06-08 DIAGNOSIS — Z9889 Other specified postprocedural states: Secondary | ICD-10-CM | POA: Insufficient documentation

## 2016-06-08 DIAGNOSIS — R918 Other nonspecific abnormal finding of lung field: Secondary | ICD-10-CM

## 2016-06-08 DIAGNOSIS — D508 Other iron deficiency anemias: Secondary | ICD-10-CM | POA: Diagnosis not present

## 2016-06-08 DIAGNOSIS — E782 Mixed hyperlipidemia: Secondary | ICD-10-CM | POA: Diagnosis not present

## 2016-06-08 LAB — CMP (CANCER CENTER ONLY)
ALT(SGPT): 21 U/L (ref 10–47)
AST: 27 U/L (ref 11–38)
Albumin: 3.3 g/dL (ref 3.3–5.5)
Alkaline Phosphatase: 73 U/L (ref 26–84)
BUN: 34 mg/dL — AB (ref 7–22)
CHLORIDE: 103 meq/L (ref 98–108)
CO2: 29 meq/L (ref 18–33)
Calcium: 9.5 mg/dL (ref 8.0–10.3)
Creat: 1.9 mg/dl — ABNORMAL HIGH (ref 0.6–1.2)
Glucose, Bld: 90 mg/dL (ref 73–118)
POTASSIUM: 4.1 meq/L (ref 3.3–4.7)
Sodium: 138 mEq/L (ref 128–145)
TOTAL PROTEIN: 7.6 g/dL (ref 6.4–8.1)
Total Bilirubin: 0.9 mg/dl (ref 0.20–1.60)

## 2016-06-08 LAB — CBC WITH DIFFERENTIAL (CANCER CENTER ONLY)
BASO#: 0 10*3/uL (ref 0.0–0.2)
BASO%: 0.8 % (ref 0.0–2.0)
EOS%: 3.4 % (ref 0.0–7.0)
Eosinophils Absolute: 0.1 10*3/uL (ref 0.0–0.5)
HEMATOCRIT: 33.8 % — AB (ref 34.8–46.6)
HGB: 11.1 g/dL — ABNORMAL LOW (ref 11.6–15.9)
LYMPH#: 1.1 10*3/uL (ref 0.9–3.3)
LYMPH%: 28.9 % (ref 14.0–48.0)
MCH: 28.1 pg (ref 26.0–34.0)
MCHC: 32.8 g/dL (ref 32.0–36.0)
MCV: 86 fL (ref 81–101)
MONO#: 0.5 10*3/uL (ref 0.1–0.9)
MONO%: 12.6 % (ref 0.0–13.0)
NEUT#: 2.1 10*3/uL (ref 1.5–6.5)
NEUT%: 54.3 % (ref 39.6–80.0)
PLATELETS: 163 10*3/uL (ref 145–400)
RBC: 3.95 10*6/uL (ref 3.70–5.32)
RDW: 14.1 % (ref 11.1–15.7)
WBC: 3.8 10*3/uL — AB (ref 3.9–10.0)

## 2016-06-08 LAB — IRON AND TIBC
%SAT: 19 % — AB (ref 21–57)
IRON: 60 ug/dL (ref 41–142)
TIBC: 310 ug/dL (ref 236–444)
UIBC: 250 ug/dL (ref 120–384)

## 2016-06-08 LAB — CHCC SATELLITE - SMEAR

## 2016-06-08 LAB — FERRITIN: FERRITIN: 43 ng/mL (ref 9–269)
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
## 2016-06-09 LAB — RETICULOCYTES: Reticulocyte Count: 0.4 % — ABNORMAL LOW (ref 0.6–2.6)

## 2016-06-27 ENCOUNTER — Encounter: Payer: Self-pay | Admitting: Family Medicine

## 2016-06-27 ENCOUNTER — Other Ambulatory Visit: Payer: Self-pay | Admitting: Family Medicine

## 2016-06-27 MED ORDER — LOSARTAN POTASSIUM 100 MG PO TABS: 100.0000 mg | ORAL_TABLET | Freq: Every day | ORAL | 2 refills | Status: DC

## 2016-06-27 MED ORDER — LEVOTHYROXINE SODIUM 112 MCG PO TABS: ORAL_TABLET | ORAL | 2 refills | Status: DC

## 2016-06-27 MED FILL — LOSARTAN POTASSIUM 100 MG T: 100 | 30 days supply | Qty: 30 | Fill #3

## 2016-06-27 MED FILL — SYNTHROID 112 MCG TABLET: 112 | 30 days supply | Qty: 30 | Fill #0

## 2016-07-11 ENCOUNTER — Encounter: Payer: Self-pay | Admitting: Family Medicine

## 2016-07-13 ENCOUNTER — Other Ambulatory Visit: Payer: Self-pay | Admitting: Family Medicine

## 2016-07-13 DIAGNOSIS — N289 Disorder of kidney and ureter, unspecified: Secondary | ICD-10-CM

## 2016-07-13 DIAGNOSIS — R7989 Other specified abnormal findings of blood chemistry: Secondary | ICD-10-CM

## 2016-07-25 MED FILL — SYNTHROID 112 MCG TABLET: 112 | 30 days supply | Qty: 30 | Fill #1

## 2016-07-25 MED FILL — LOSARTAN POTASSIUM 100 MG T: 100 | 30 days supply | Qty: 30 | Fill #4

## 2016-08-04 ENCOUNTER — Other Ambulatory Visit (INDEPENDENT_AMBULATORY_CARE_PROVIDER_SITE_OTHER): Payer: PRIVATE HEALTH INSURANCE

## 2016-08-04 DIAGNOSIS — E782 Mixed hyperlipidemia: Secondary | ICD-10-CM | POA: Diagnosis not present

## 2016-08-04 DIAGNOSIS — E039 Hypothyroidism, unspecified: Secondary | ICD-10-CM

## 2016-08-04 DIAGNOSIS — E663 Overweight: Secondary | ICD-10-CM | POA: Diagnosis not present

## 2016-08-04 DIAGNOSIS — N289 Disorder of kidney and ureter, unspecified: Secondary | ICD-10-CM | POA: Diagnosis not present

## 2016-08-04 DIAGNOSIS — E559 Vitamin D deficiency, unspecified: Secondary | ICD-10-CM

## 2016-08-04 LAB — COMPREHENSIVE METABOLIC PANEL
ALBUMIN: 3.7 g/dL (ref 3.5–5.2)
ALK PHOS: 73 U/L (ref 39–117)
ALT: 20 U/L (ref 0–35)
AST: 26 U/L (ref 0–37)
BUN: 35 mg/dL — AB (ref 6–23)
CHLORIDE: 106 meq/L (ref 96–112)
CO2: 29 mEq/L (ref 19–32)
CREATININE: 2.21 mg/dL — AB (ref 0.40–1.20)
Calcium: 9.4 mg/dL (ref 8.4–10.5)
GFR: 28.71 mL/min — ABNORMAL LOW (ref >60.00)
Glucose, Bld: 83 mg/dL (ref 70–99)
Potassium: 4.5 mEq/L (ref 3.5–5.1)
SODIUM: 140 meq/L (ref 135–145)
TOTAL PROTEIN: 7.6 g/dL (ref 6.0–8.3)
Total Bilirubin: 0.5 mg/dL (ref 0.2–1.2)

## 2016-08-04 LAB — LIPID PANEL
CHOLESTEROL: 256 mg/dL — AB (ref 0–200)
HDL: 124.1 mg/dL (ref >39.00)
LDL CALC: 124 mg/dL — AB (ref 0–99)
NONHDL: 131.77
Total CHOL/HDL Ratio: 2
Triglycerides: 37 mg/dL (ref 0.0–149.0)
VLDL: 7.4 mg/dL (ref 0.0–40.0)

## 2016-08-04 LAB — CBC
HCT: 33.5 % — ABNORMAL LOW (ref 36.0–46.0)
Hemoglobin: 11.1 g/dL — ABNORMAL LOW (ref 12.0–15.0)
MCHC: 33.3 g/dL (ref 30.0–36.0)
MCV: 83.5 fl (ref 78.0–100.0)
PLATELETS: 178 10*3/uL (ref 150.0–400.0)
RBC: 4.01 Mil/uL (ref 3.87–5.11)
RDW: 14.5 % (ref 11.5–15.5)
WBC: 4.3 10*3/uL (ref 4.0–10.5)

## 2016-08-04 LAB — TSH: TSH: 2.87 u[IU]/mL (ref 0.35–4.50)

## 2016-08-04 LAB — VITAMIN D 25 HYDROXY (VIT D DEFICIENCY, FRACTURES): VITD: 43.69 ng/mL (ref 30.00–100.00)

## 2016-08-15 ENCOUNTER — Ambulatory Visit (INDEPENDENT_AMBULATORY_CARE_PROVIDER_SITE_OTHER): Payer: PRIVATE HEALTH INSURANCE | Admitting: Family Medicine

## 2016-08-15 VITALS — BP 128/78 | HR 65 | Temp 98.0°F | Wt 199.8 lb

## 2016-08-15 DIAGNOSIS — E559 Vitamin D deficiency, unspecified: Secondary | ICD-10-CM

## 2016-08-15 DIAGNOSIS — E663 Overweight: Secondary | ICD-10-CM

## 2016-08-15 DIAGNOSIS — N289 Disorder of kidney and ureter, unspecified: Secondary | ICD-10-CM

## 2016-08-15 DIAGNOSIS — E039 Hypothyroidism, unspecified: Secondary | ICD-10-CM

## 2016-08-15 DIAGNOSIS — E782 Mixed hyperlipidemia: Secondary | ICD-10-CM | POA: Diagnosis not present

## 2016-08-15 NOTE — Assessment & Plan Note (Signed)
Encouraged heart healthy diet, increase exercise, avoid trans fats, consider a krill oil cap daily 

## 2016-08-15 NOTE — Progress Notes (Signed)
Patient ID: Stephanie Pierce, female   DOB: [birth year], 65 y.o.   MRN: [id number]   Subjective:    Patient ID: Stephanie Pierce, female    DOB: Jan 09, 1951, 65 y.o.   MRN: [id number]  No chief complaint on file.   HPI Patient is in today for 6 month follow up patient has acute concerns about her kidney functions. She feels well. No recent febrile illness or recent hospitalizations. She is eating a heart healthy diet but acknowledges that she may not drink enough fluids each day. Denies CP/palp/SOB/HA/congestion/fevers/GI or GU c/o. Taking meds as prescribed  Past Medical History:  Diagnosis Date  . Allergic state 03/23/2015  . Allergy   . Breast fibrocystic disorder   . Cancer ([place])   . DCIS (ductal carcinoma in situ) of breast   . 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 Enlarged thyroid gland   . H/O iron deficiency anemia 03/23/2015  . History of chicken pox 03/23/2015  . Hyperlipidemia, mixed 03/23/2015  . Hypothyroidism 03/23/2015  . Overweight 03/23/2015  . Pneumonia 2012  . Renal insufficiency 03/23/2015  . S/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  . Vitamin D deficiency 05/12/2015    Past Surgical History:  Procedure Laterality Date  . BREAST LUMPECTOMY     h/o 2 needle biopsy, in 2015 right Lumpectomy DCIS stage 0  . CESAREAN SECTION  1985  . LAPAROSCOPIC GASTRIC SLEEVE RESECTION      Family History  Problem Relation Age of Onset  . Hypertension Father   . Heart attack Father   . Alcohol abuse Father   . Diabetes Brother   . Other Brother     complications of exposure to agent orange  . Stroke Brother   . Diverticulosis Daughter   . Diabetes Sister   . Hypertension Sister     Social History   Social History  . Marital status: Divorced    Spouse name: N/A  . Number of children: N/A  . Years of education: 81   Occupational History  . RN Case Manager    Social History Main Topics  . Smoking status: Never Smoker  .  Smokeless tobacco: Never Used  . Alcohol use No  . Drug use: No  . Sexual activity: Yes     Comment: 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, RN case Manager, no dietary restrictions follows heart healthy diet with 60 to 80 gm of protein   Other Topics Concern  . Not on file   Social History Narrative  . No narrative on file    Outpatient Medications Prior to Visit  Medication Sig Dispense Refill  . Calcium Citrate-Vitamin D (CALCIUM CITRATE CHEWY BITE) 500-500 MG-UNIT CHEW Chew by mouth daily.    . Cholecalciferol (VITAMIN D3) 10000 UNITS capsule Take 10,000 Units by mouth every other day.    . Cyanocobalamin (B-12) 2500 MCG TABS Take 1 tablet by mouth once a week.    Marland Kitchen KRILL OIL OMEGA-3 PO Take by mouth. Take 2 daily    . levothyroxine (SYNTHROID) 112 MCG tablet Take 1 tablet by mouth  daily 90 tablet 2  . losartan (COZAAR) 100 MG tablet Take 1 tablet (100 mg total) by mouth daily. 90 tablet 2  . montelukast (SINGULAIR) 10 MG tablet Take 1 tablet (10 mg total) by mouth at bedtime as needed. 90 tablet 1  . Multiple Vitamins-Minerals (CENTRUM SILVER ADULT 50+ PO) Take 1 tablet by mouth every other day.  No facility-administered medications prior to visit.     Allergies  Allergen Reactions  . Iodine Hives  . Lisinopril Swelling    Swelling in the throat  . Penicillins Rash    Review of Systems  Constitutional: Negative for fever.  Eyes: Negative for blurred vision.  Respiratory: Negative for cough and shortness of breath.   Cardiovascular: Negative for chest pain and palpitations.  Gastrointestinal: Negative for vomiting.  Musculoskeletal: Negative for back pain.  Skin: Negative for rash.  Neurological: Negative for loss of consciousness and headaches.  Endo/Heme/Allergies: Negative for polydipsia.       Objective:    Physical Exam  Constitutional: She is oriented to person, place, and time. She appears well-developed and well-nourished. No distress.  HENT:  Head: Normocephalic  and atraumatic.  Eyes: Conjunctivae are normal.  Neck: Normal range of motion. No thyromegaly present.  Cardiovascular: Normal rate and regular rhythm.   Pulmonary/Chest: Effort normal and breath sounds normal. She has no wheezes.  Abdominal: Soft. Bowel sounds are normal. She exhibits no mass. There is no tenderness.  Musculoskeletal: Normal range of motion. She exhibits no edema or deformity.  Neurological: She is alert and oriented to person, place, and time.  Skin: Skin is warm and dry. She is not diaphoretic.  Psychiatric: She has a normal mood and affect.    BP 128/78 (BP Location: Left Arm, Patient Position: Sitting, Cuff Size: Normal)   Pulse 65   Temp 98 F (36.7 C) (Oral)   Wt 199 lb 12.8 oz (90.6 kg)   SpO2 99%   BMI 31.29 kg/m  Wt Readings from Last 3 Encounters:  08/15/16 199 lb 12.8 oz (90.6 kg)  06/08/16 193 lb (87.5 kg)  03/02/16 193 lb (87.5 kg)     Lab Results  Component Value Date   WBC 4.3 08/04/2016   HGB 11.1 (L) 08/04/2016   HCT 33.5 (L) 08/04/2016   PLT 178.0 08/04/2016   GLUCOSE 83 08/04/2016   CHOL 256 (H) 08/04/2016   TRIG 37.0 08/04/2016   HDL 124.10 08/04/2016   LDLCALC 124 (H) 08/04/2016   ALT 20 08/04/2016   AST 26 08/04/2016   NA 140 08/04/2016   K 4.5 08/04/2016   CL 106 08/04/2016   CREATININE 2.21 (H) 08/04/2016   BUN 35 (H) 08/04/2016   CO2 29 08/04/2016   TSH 2.87 08/04/2016    Lab Results  Component Value Date   TSH 2.87 08/04/2016   Lab Results  Component Value Date   WBC 4.3 08/04/2016   HGB 11.1 (L) 08/04/2016   HCT 33.5 (L) 08/04/2016   MCV 83.5 08/04/2016   PLT 178.0 08/04/2016   Lab Results  Component Value Date   NA 140 08/04/2016   K 4.5 08/04/2016   CHLORIDE 107 01/26/2016   CO2 29 08/04/2016   GLUCOSE 83 08/04/2016   BUN 35 (H) 08/04/2016   CREATININE 2.21 (H) 08/04/2016   BILITOT 0.5 08/04/2016   ALKPHOS 73 08/04/2016   AST 26 08/04/2016   ALT 20 08/04/2016   PROT 7.6 08/04/2016   ALBUMIN 3.7  08/04/2016   CALCIUM 9.4 08/04/2016   ANIONGAP 10 01/26/2016   EGFR 38 (L) 01/26/2016   GFR 28.71 (L) 08/04/2016   Lab Results  Component Value Date   CHOL 256 (H) 08/04/2016   Lab Results  Component Value Date   HDL 124.10 08/04/2016   Lab Results  Component Value Date   LDLCALC 124 (H) 08/04/2016   Lab Results  Component  Value Date   TRIG 37.0 08/04/2016   Lab Results  Component Value Date   CHOLHDL 2 08/04/2016   No results found for: HGBA1C    I acted as a Education administrator for Dr. Charlett Blake. [name], RMA  Assessment & Plan:   Problem List Items Addressed This Visit    Vitamin D deficiency (Chronic)    Continue daily vitamin d 1000 mg daily      Relevant Orders   VITAMIN D 25 Hydroxy (Vit-D Deficiency, Fractures)   Hyperlipidemia, mixed - Primary    Encouraged heart healthy diet, increase exercise, avoid trans fats, consider a krill oil cap daily      Relevant Orders   Lipid panel   Renal insufficiency    Creatinine up from last visit. Will recheck cmp in 1 month      Relevant Orders   CBC   Comprehensive metabolic panel   Overweight    Encouraged DASH diet, decrease po intake and increase exercise as tolerated. Needs 7-8 hours of sleep nightly. Avoid trans fats, eat small, frequent meals every 4-5 hours with lean proteins, complex carbs and healthy fats. Minimize simple carbs      Relevant Orders   Comprehensive metabolic panel   CBC   Comprehensive metabolic panel   Hypothyroidism    On Levothyroxine, continue to monitor      Relevant Orders   TSH      I am having Ms. Galla maintain her Multiple Vitamins-Minerals (CENTRUM SILVER ADULT 50+ PO), B-12, Vitamin D3, Calcium Citrate-Vitamin D, KRILL OIL OMEGA-3 PO, montelukast, levothyroxine, and losartan.  No orders of the defined types were placed in this encounter.   CMA served as Education administrator during this visit. History, Physical and Plan performed by medical provider. Documentation and orders reviewed and  attested to.   Penni Homans, MD

## 2016-08-15 NOTE — Progress Notes (Signed)
Pre visit review using our clinic review tool, if applicable. No additional management support is needed unless otherwise documented below in the visit note. 

## 2016-08-15 NOTE — Assessment & Plan Note (Signed)
On Levothyroxine, continue to monitor 

## 2016-08-15 NOTE — Assessment & Plan Note (Signed)
Encouraged DASH diet, decrease po intake and increase exercise as tolerated. Needs 7-8 hours of sleep nightly. Avoid trans fats, eat small, frequent meals every 4-5 hours with lean proteins, complex carbs and healthy fats. Minimize simple carbs 

## 2016-08-15 NOTE — Assessment & Plan Note (Signed)
Continue daily vitamin d 1000 mg daily

## 2016-08-15 NOTE — Assessment & Plan Note (Signed)
Increase leafy greens, consider increased lean red meat and using cast iron cookware. Continue to monitor, report any concerns 

## 2016-08-15 NOTE — Assessment & Plan Note (Signed)
Creatinine up from last visit. Will recheck cmp in 1 month

## 2016-08-15 NOTE — Patient Instructions (Signed)
Anemia, Nonspecific Anemia is a condition in which the concentration of red blood cells or hemoglobin in the blood is below normal. Hemoglobin is a substance in red blood cells that carries oxygen to the tissues of the body. Anemia results in not enough oxygen reaching these tissues. What are the causes? Common causes of anemia include:  Excessive bleeding. Bleeding may be internal or external. This includes excessive bleeding from periods (in women) or from the intestine.  Poor nutrition.  Chronic kidney, thyroid, and liver disease.  Bone marrow disorders that decrease red blood cell production.  Cancer and treatments for cancer.  HIV, AIDS, and their treatments.  Spleen problems that increase red blood cell destruction.  Blood disorders.  Excess destruction of red blood cells due to infection, medicines, and autoimmune disorders. What are the signs or symptoms?  Minor weakness.  Dizziness.  Headache.  Palpitations.  Shortness of breath, especially with exercise.  Paleness.  Cold sensitivity.  Indigestion.  Nausea.  Difficulty sleeping.  Difficulty concentrating. Symptoms may occur suddenly or they may develop slowly. How is this diagnosed? Additional blood tests are often needed. These help your health care provider determine the best treatment. Your health care provider will check your stool for blood and look for other causes of blood loss. How is this treated? Treatment varies depending on the cause of the anemia. Treatment can include:  Supplements of iron, vitamin B12, or folic acid.  Hormone medicines.  A blood transfusion. This may be needed if blood loss is severe.  Hospitalization. This may be needed if there is significant continual blood loss.  Dietary changes.  Spleen removal. Follow these instructions at home: Keep all follow-up appointments. It often takes many weeks to correct anemia, and having your health care provider check on your  condition and your response to treatment is very important. Get help right away if:  You develop extreme weakness, shortness of breath, or chest pain.  You become dizzy or have trouble concentrating.  You develop heavy vaginal bleeding.  You develop a rash.  You have bloody or black, tarry stools.  You faint.  You vomit up blood.  You vomit repeatedly.  You have abdominal pain.  You have a fever or persistent symptoms for more than 2-3 days.  You have a fever and your symptoms suddenly get worse.  You are dehydrated. This information is not intended to replace advice given to you by your health care provider. Make sure you discuss any questions you have with your health care provider. Document Released: 09/21/2004 Document Revised: 01/26/2016 Document Reviewed: 02/07/2013 Elsevier Interactive Patient Education  2017 [employer]  

## 2016-08-24 MED FILL — LOSARTAN POTASSIUM 100 MG T: 100 | 30 days supply | Qty: 30 | Fill #5

## 2016-08-24 MED FILL — SYNTHROID 112 MCG TABLET: 112 | 30 days supply | Qty: 30 | Fill #2

## 2016-09-21 MED FILL — SYNTHROID 112 MCG TABLET: 112 | 30 days supply | Qty: 30 | Fill #3

## 2016-10-04 ENCOUNTER — Other Ambulatory Visit: Payer: Self-pay | Admitting: Hematology & Oncology

## 2016-10-04 DIAGNOSIS — Z1231 Encounter for screening mammogram for malignant neoplasm of breast: Secondary | ICD-10-CM

## 2016-10-04 DIAGNOSIS — Z853 Personal history of malignant neoplasm of breast: Secondary | ICD-10-CM

## 2016-10-23 MED FILL — LOSARTAN POTASSIUM 100 MG T: 100 | 30 days supply | Qty: 30 | Fill #6

## 2016-10-23 MED FILL — SYNTHROID 112 MCG TABLET: 112 | 30 days supply | Qty: 30 | Fill #4

## 2016-11-03 ENCOUNTER — Ambulatory Visit
Admission: RE | Admit: 2016-11-03 | Discharge: 2016-11-03 | Disposition: A | Discharge status: Home / Self Care | Payer: PRIVATE HEALTH INSURANCE | Source: Ambulatory Visit | Attending: Hematology & Oncology | Admitting: Hematology & Oncology

## 2016-11-03 DIAGNOSIS — Z1231 Encounter for screening mammogram for malignant neoplasm of breast: Secondary | ICD-10-CM

## 2016-11-03 DIAGNOSIS — Z853 Personal history of malignant neoplasm of breast: Secondary | ICD-10-CM

## 2016-11-22 MED FILL — SYNTHROID 112 MCG TABLET: 112 | 30 days supply | Qty: 30 | Fill #5

## 2016-12-07 ENCOUNTER — Ambulatory Visit (HOSPITAL_BASED_OUTPATIENT_CLINIC_OR_DEPARTMENT_OTHER)
Admission: RE | Admit: 2016-12-07 | Discharge: 2016-12-07 | Disposition: A | Discharge status: Home / Self Care | Payer: PRIVATE HEALTH INSURANCE | Source: Ambulatory Visit | Attending: Hematology & Oncology | Admitting: Hematology & Oncology

## 2016-12-07 ENCOUNTER — Other Ambulatory Visit: Payer: PRIVATE HEALTH INSURANCE

## 2016-12-07 ENCOUNTER — Ambulatory Visit (HOSPITAL_BASED_OUTPATIENT_CLINIC_OR_DEPARTMENT_OTHER): Payer: PRIVATE HEALTH INSURANCE | Admitting: Family

## 2016-12-07 VITALS — BP 131/82 | HR 60 | Temp 97.7°F | Resp 17 | Wt 205.1 lb

## 2016-12-07 DIAGNOSIS — E782 Mixed hyperlipidemia: Secondary | ICD-10-CM | POA: Diagnosis not present

## 2016-12-07 DIAGNOSIS — R918 Other nonspecific abnormal finding of lung field: Secondary | ICD-10-CM | POA: Insufficient documentation

## 2016-12-07 DIAGNOSIS — D0511 Intraductal carcinoma in situ of right breast: Secondary | ICD-10-CM

## 2016-12-07 LAB — CBC WITH DIFFERENTIAL (CANCER CENTER ONLY)
BASO#: 0 10*3/uL (ref 0.0–0.2)
BASO%: 0.8 % (ref 0.0–2.0)
EOS ABS: 0.2 10*3/uL (ref 0.0–0.5)
EOS%: 4.9 % (ref 0.0–7.0)
HCT: 34.3 % — ABNORMAL LOW (ref 34.8–46.6)
HGB: 11.2 g/dL — ABNORMAL LOW (ref 11.6–15.9)
LYMPH#: 1.2 10*3/uL (ref 0.9–3.3)
LYMPH%: 30.3 % (ref 14.0–48.0)
MCH: 28.2 pg (ref 26.0–34.0)
MCHC: 32.7 g/dL (ref 32.0–36.0)
MCV: 86 fL (ref 81–101)
MONO#: 0.5 10*3/uL (ref 0.1–0.9)
MONO%: 13.3 % — ABNORMAL HIGH (ref 0.0–13.0)
NEUT#: 2 10*3/uL (ref 1.5–6.5)
NEUT%: 50.7 % (ref 39.6–80.0)
Platelets: 165 10*3/uL (ref 145–400)
RBC: 3.97 10*6/uL (ref 3.70–5.32)
RDW: 13.4 % (ref 11.1–15.7)
WBC: 3.9 10*3/uL (ref 3.9–10.0)

## 2016-12-07 LAB — COMPREHENSIVE METABOLIC PANEL
ALBUMIN: 3.5 g/dL (ref 3.5–5.0)
ALK PHOS: 73 U/L (ref 40–150)
ALT: 21 U/L (ref 0–55)
AST: 23 U/L (ref 5–34)
Anion Gap: 8 mEq/L (ref 3–11)
BUN: 34.7 mg/dL — AB (ref 7.0–26.0)
CHLORIDE: 105 meq/L (ref 98–109)
CO2: 28 mEq/L (ref 22–29)
CREATININE: 2.2 mg/dL — AB (ref 0.6–1.1)
Calcium: 9.3 mg/dL (ref 8.4–10.4)
EGFR: 26 mL/min/{1.73_m2} — ABNORMAL LOW (ref >90)
GLUCOSE: 88 mg/dL (ref 70–140)
POTASSIUM: 4.5 meq/L (ref 3.5–5.1)
SODIUM: 140 meq/L (ref 136–145)
Total Bilirubin: 0.55 mg/dL (ref 0.20–1.20)
Total Protein: 7.6 g/dL (ref 6.4–8.3)

## 2016-12-07 LAB — LACTATE DEHYDROGENASE: LDH: 175 U/L (ref 125–245)

## 2016-12-07 NOTE — Progress Notes (Signed)
Hematology and Oncology Follow Up Visit  Stephanie Pierce [id number] [birth year] 66 y.o. 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 Her CT scan showed that index nodules have decreased in size and are most compatible with atypical infection or inflammatory disease. There was no evidence of new or progressive changes identified. She had her mammogram last month which was negative. Breast exam today was negative.  She has had some sinus drainage and a dry cough with the changes in weather.  No fever, chills, n/v, cough, rash, dizziness, SOB, chest pain, palpitations, abdominal pain or changes in bowel or bladder habits.  No swelling, tenderness, numbness or tingling in her extremities. No new aches or pains.  She has maintained a healthy appetite and is staying well hydrated. Her weight is  She is staying active going to the gym several days a week.  She is still working on her PhD and hopes to finish in a couple months.   ECOG Performance Status: 0 - Asymptomatic  Medications:  Allergies as of 12/07/2016      Reactions   Iodine Hives   Lisinopril Swelling   Swelling in the throat   Penicillins Rash      Medication List       Accurate as of 12/07/16  9:04 AM. Always use your most recent med list.          B-12 2500 MCG Tabs Take 1 tablet by mouth once a week.   CALCIUM CITRATE CHEWY BITE 500-500 MG-UNIT Chew Generic drug:  Calcium Citrate-Vitamin D Chew by mouth daily.   CENTRUM SILVER ADULT 50+ PO Take 1 tablet by mouth every other day.   KRILL OIL OMEGA-3 PO Take by mouth. Take 2 daily   levothyroxine 112 MCG tablet Commonly known as:  SYNTHROID Take 1 tablet by mouth  daily   losartan 100 MG tablet Commonly known as:  COZAAR Take 1 tablet (100 mg total) by  mouth daily.   montelukast 10 MG tablet Commonly known as:  SINGULAIR Take 1 tablet (10 mg total) by mouth at bedtime as needed.   Vitamin D3 10000 units capsule Take 10,000 Units by mouth every other day.       Allergies:  Allergies  Allergen Reactions  . Iodine Hives  . Lisinopril Swelling    Swelling in the throat  . Penicillins Rash    Past Medical History, Surgical history, Social history, and Family History were reviewed and updated.  Review of Systems: All other 10 point review of systems is negative.   Physical Exam:  vitals were not taken for this visit.  Wt Readings from Last 3 Encounters:  08/15/16 199 lb 12.8 oz (90.6 kg)  06/08/16 193 lb (87.5 kg)  03/02/16 193 lb (87.5 kg)    Ocular: Sclerae unicteric, pupils equal, round and reactive to light Ear-nose-throat: Oropharynx clear, dentition fair Lymphatic: No cervical, supraclavicular or axillary adenopathy Lungs no rales or rhonchi, good excursion bilaterally Heart regular rate and rhythm, no murmur appreciated Abd soft, nontender, positive bowel sounds, no liver or spleen tip palpated on exam, no fluid wave MSK no focal spinal tenderness, no joint edema Neuro: non-focal, well-oriented, appropriate affect Breasts: Right breast lumpectomy at the 8-9 o'clock position is well healed. No changes to the left breast. No mass, lesion,  rash or lymphadenopathy found on exam.   Lab Results  Component Value Date   WBC 4.3 08/04/2016   HGB 11.1 (L) 08/04/2016   HCT 33.5 (L) 08/04/2016   MCV 83.5 08/04/2016   PLT 178.0 08/04/2016   Lab Results  Component Value Date   FERRITIN 43 06/08/2016   IRON 60 06/08/2016   TIBC 310 06/08/2016   UIBC 250 06/08/2016   IRONPCTSAT 19 (L) 06/08/2016   Lab Results  Component Value Date   RBC 4.01 08/04/2016   No results found for: KPAFRELGTCHN, LAMBDASER, KAPLAMBRATIO No results found for: IGGSERUM, IGA, IGMSERUM No results found for: Kathrynn Ducking, MSPIKE, SPEI   Chemistry      Component Value Date/Time   NA 140 08/04/2016 0712   NA 138 06/08/2016 0902   NA 140 01/26/2016 1319   K 4.5 08/04/2016 0712   K 4.1 06/08/2016 0902   K 4.4 01/26/2016 1319   CL 106 08/04/2016 0712   CL 103 06/08/2016 0902   CO2 29 08/04/2016 0712   CO2 29 06/08/2016 0902   CO2 23 01/26/2016 1319   BUN 35 (H) 08/04/2016 0712   BUN 34 (H) 06/08/2016 0902   BUN 22.1 01/26/2016 1319   CREATININE 2.21 (H) 08/04/2016 0712   CREATININE 1.9 (H) 06/08/2016 0902   CREATININE 1.6 (H) 01/26/2016 1319      Component Value Date/Time   CALCIUM 9.4 08/04/2016 0712   CALCIUM 9.5 06/08/2016 0902   CALCIUM 9.5 01/26/2016 1319   ALKPHOS 73 08/04/2016 0712   ALKPHOS 73 06/08/2016 0902   ALKPHOS 60 01/26/2016 1319   AST 26 08/04/2016 0712   AST 27 06/08/2016 0902   AST 20 01/26/2016 1319   ALT 20 08/04/2016 0712   ALT 21 06/08/2016 0902   ALT 17 01/26/2016 1319   BILITOT 0.5 08/04/2016 0712   BILITOT 0.90 06/08/2016 0902   BILITOT 0.53 01/26/2016 1319     Impression and Plan: Stephanie Pierce is a 29 yo African American female with DCIS of the right breast diagnosed in July 2015 with lumpectomy. She elected to forego any hormonal therapy. So far, she has done well and there has been no evidence of recurrence. She is doing well and has no complaints at this time.  CT scan today to re-evaluate her lung nodules showed that they have decreased in size and are most compatible with atypical infection or inflammatory disease. She spent years living in [place] so this is felt to be fungal. There was no evidence of new or progressive changes. We continue to follow along with her and plan to see her back in 6 months for repeat lab work and follow-up. We will repeat a chest CT in 1 year.  I spent 25 minutes face to face with the patient counseling and going over results.  She will contact our office with any questions or concerns. We can certainly  see her sooner if need be.   Eliezer Bottom, NP 4/12/20189:04 AM

## 2016-12-19 MED FILL — SYNTHROID 112 MCG TABLET: 112 | 30 days supply | Qty: 30 | Fill #6

## 2016-12-19 MED FILL — LOSARTAN POTASSIUM 100 MG T: 100 | 30 days supply | Qty: 30 | Fill #7

## 2017-01-19 MED FILL — SYNTHROID 112 MCG TABLET: 112 | 30 days supply | Qty: 30 | Fill #7

## 2017-01-19 MED FILL — LOSARTAN POTASSIUM 100 MG T: 100 | 30 days supply | Qty: 30 | Fill #8

## 2017-02-16 ENCOUNTER — Telehealth: Payer: Self-pay | Admitting: Family Medicine

## 2017-02-16 MED ORDER — LOSARTAN POTASSIUM 100 MG PO TABS: 100.0000 mg | ORAL_TABLET | Freq: Every day | ORAL | 2 refills | Status: DC

## 2017-02-16 MED ORDER — LEVOTHYROXINE SODIUM 112 MCG PO TABS: ORAL_TABLET | ORAL | 2 refills | Status: DC

## 2017-02-16 MED FILL — LOSARTAN POTASSIUM 100 MG T: 100 | 30 days supply | Qty: 30 | Fill #0

## 2017-02-16 MED FILL — SYNTHROID 112 MCG TABLET: 112 | 30 days supply | Qty: 30 | Fill #0

## 2017-02-16 NOTE — Telephone Encounter (Signed)
Medication refills sent. If patient wants to discuss labs while in the office she needs to get them one prior. If not she does not have to get them done.

## 2017-02-16 NOTE — Telephone Encounter (Signed)
Relation to EL:FYBO Call back number:[phone] Pharmacy:  Reason for call:  Patient 03/15/17 physical / pap appointment due to provider. Patient was ok with rescheduling to 05/03/17 at 10:30am with provider, patient only concern she will run out of levothyroxine (SYNTHROID) 112 MCG tablet and losartan (COZAAR) 100 MG tablet requesting a refill.  Patient in addition inquiring if pre visit labs are necessary prior to physical / pap scheduled for 05/03/17, please advise

## 2017-03-15 ENCOUNTER — Encounter: Payer: PRIVATE HEALTH INSURANCE | Admitting: Family Medicine

## 2017-03-21 MED FILL — LOSARTAN POTASSIUM 100 MG T: 100 | 30 days supply | Qty: 30 | Fill #1

## 2017-03-21 MED FILL — SYNTHROID 112 MCG TABLET: 112 | 30 days supply | Qty: 30 | Fill #1

## 2017-04-16 MED FILL — LOSARTAN POTASSIUM 100 MG T: 100 | 30 days supply | Qty: 30 | Fill #2

## 2017-04-16 MED FILL — SYNTHROID 112 MCG TABLET: 112 | 30 days supply | Qty: 30 | Fill #2

## 2017-05-03 ENCOUNTER — Encounter: Payer: PRIVATE HEALTH INSURANCE | Admitting: Family Medicine

## 2017-05-09 MED FILL — SYNTHROID 112 MCG TABLET: 112 | 30 days supply | Qty: 30 | Fill #3

## 2017-05-09 MED FILL — LOSARTAN POTASSIUM 100 MG T: 100 | 30 days supply | Qty: 30 | Fill #3

## 2017-06-07 MED FILL — SYNTHROID 112 MCG TABLET: 112 | 30 days supply | Qty: 30 | Fill #4

## 2017-06-08 ENCOUNTER — Other Ambulatory Visit (HOSPITAL_BASED_OUTPATIENT_CLINIC_OR_DEPARTMENT_OTHER): Payer: Medicare Other

## 2017-06-08 ENCOUNTER — Ambulatory Visit (HOSPITAL_BASED_OUTPATIENT_CLINIC_OR_DEPARTMENT_OTHER): Payer: Medicare Other | Admitting: Hematology & Oncology

## 2017-06-08 DIAGNOSIS — Z23 Encounter for immunization: Secondary | ICD-10-CM | POA: Diagnosis not present

## 2017-06-08 DIAGNOSIS — D0511 Intraductal carcinoma in situ of right breast: Secondary | ICD-10-CM

## 2017-06-08 DIAGNOSIS — R918 Other nonspecific abnormal finding of lung field: Secondary | ICD-10-CM | POA: Diagnosis not present

## 2017-06-08 DIAGNOSIS — E559 Vitamin D deficiency, unspecified: Secondary | ICD-10-CM

## 2017-06-08 DIAGNOSIS — N289 Disorder of kidney and ureter, unspecified: Secondary | ICD-10-CM

## 2017-06-08 LAB — CBC WITH DIFFERENTIAL (CANCER CENTER ONLY)
BASO#: 0 10*3/uL (ref 0.0–0.2)
BASO%: 0.7 % (ref 0.0–2.0)
EOS ABS: 0.2 10*3/uL (ref 0.0–0.5)
EOS%: 4.3 % (ref 0.0–7.0)
HCT: 34 % — ABNORMAL LOW (ref 34.8–46.6)
HEMOGLOBIN: 11.3 g/dL — AB (ref 11.6–15.9)
LYMPH#: 1.4 10*3/uL (ref 0.9–3.3)
LYMPH%: 31.9 % (ref 14.0–48.0)
MCH: 28.6 pg (ref 26.0–34.0)
MCHC: 33.2 g/dL (ref 32.0–36.0)
MCV: 86 fL (ref 81–101)
MONO#: 0.5 10*3/uL (ref 0.1–0.9)
MONO%: 11.8 % (ref 0.0–13.0)
NEUT%: 51.3 % (ref 39.6–80.0)
NEUTROS ABS: 2.3 10*3/uL (ref 1.5–6.5)
Platelets: 164 10*3/uL (ref 145–400)
RBC: 3.95 10*6/uL (ref 3.70–5.32)
RDW: 13.1 % (ref 11.1–15.7)
WBC: 4.4 10*3/uL (ref 3.9–10.0)

## 2017-06-08 LAB — CMP (CANCER CENTER ONLY)
ALBUMIN: 3.3 g/dL (ref 3.3–5.5)
ALK PHOS: 69 U/L (ref 26–84)
ALT(SGPT): 27 U/L (ref 10–47)
AST: 29 U/L (ref 11–38)
BILIRUBIN TOTAL: 0.7 mg/dL (ref 0.20–1.60)
BUN, Bld: 39 mg/dL — ABNORMAL HIGH (ref 7–22)
CO2: 29 mEq/L (ref 18–33)
CREATININE: 2.1 mg/dL — AB (ref 0.6–1.2)
Calcium: 9.3 mg/dL (ref 8.0–10.3)
Chloride: 109 mEq/L — ABNORMAL HIGH (ref 98–108)
Glucose, Bld: 91 mg/dL (ref 73–118)
Potassium: 4.1 mEq/L (ref 3.3–4.7)
SODIUM: 147 meq/L — AB (ref 128–145)
TOTAL PROTEIN: 7.6 g/dL (ref 6.4–8.1)

## 2017-06-08 LAB — LACTATE DEHYDROGENASE: LDH: 179 U/L (ref 125–245)

## 2017-06-08 NOTE — Progress Notes (Signed)
Hematology and Oncology Follow Up Visit  Stephanie Pierce [id number] 08/01/1951 66 y.o. 06/08/2017   Principle Diagnosis:  Ductal carcinoma in situ of the right breast Bilateral pulmonary masses  Current Therapy:   Observation   Interim History: Stephanie Pierce is here today for follow-up. She is doing well. 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 Hopefully, the next time we see her, she will have her PhD.  She has had no problems with cough. She has a little bit of a cough. She has some sinus drainage over on the right side.  She's had no issues with nausea or vomiting. She's had no change in bowel or bladder habits. She has gained a little bit of weight. She is not working out. I have encouraged her to try to work out. She hopefully will be able to work out soon.  She's had no bleeding. There's been no fever. She's had no leg swelling.  She admits that she is not eating all the right food right now. Because this, her renal function is down a little bit. I told her that she has to be careful with the salt intake.    Currently, her performance status is ECOG 1.  Medications:  Allergies as of 06/08/2017      Reactions   Iodine Hives   Lisinopril Swelling   Swelling in the throat   Penicillins Rash      Medication List       Accurate as of 06/08/17  8:17 AM. Always use your most recent med list.          B-12 2500 MCG Tabs Take 1 tablet by mouth once a week.   CALCIUM CITRATE CHEWY BITE 500-500 MG-UNIT chewable tablet Generic drug:  calcium citrate-vitamin D Chew by mouth daily.   CENTRUM SILVER ADULT 50+ PO Take 1 tablet by mouth every other day.   KRILL OIL OMEGA-3 PO Take by mouth. Take 2 daily   levothyroxine 112 MCG tablet Commonly known as:  SYNTHROID Take 1 tablet by mouth  daily   losartan 100 MG tablet Commonly known as:  COZAAR Take 1 tablet (100 mg total) by mouth daily.   Vitamin D3 10000 units capsule Take 10,000 Units by mouth every  other day.       Allergies:  Allergies  Allergen Reactions  . Iodine Hives  . Lisinopril Swelling    Swelling in the throat  . Penicillins Rash    Past Medical History, Surgical history, Social history, and Family History were reviewed and updated.  Review of Systems: As stated in the interim history   Physical Exam:  vitals were not taken for this visit.  Wt Readings from Last 3 Encounters:  12/07/16 205 lb 1.9 oz (93 kg)  08/15/16 199 lb 12.8 oz (90.6 kg)  06/08/16 193 lb (87.5 kg)    Well-developed and well-nourished African-American female. Head and neck exam shows no ocular or oral lesions. Her vital signs showed temperature of 99. Pulse 64. Blood pressure 129/81. Weight is 202 pounds. Lungs are clear bilaterally. Cardiac exam regular rate and rhythm with no murmurs, rubs or bruits. Breast exam shows left breast with no masses, edema or erythema. There is no left axillary adenopathy. Right breast shows well-healed lumpectomy at the 9:00 position. There is some firmness at the lumpectomy site. No distinct masses noted at the lumpectomy site. There is no right axillary adenopathy. Abdomen is soft. She is mildly obese. She has no fluid wave. There  is no palpable liver or spleen tip. Back exam shows no tenderness over the spine, ribs or hips. She has no kyphosis or osteoporotic changes. Extremities shows no clubbing, cyanosis or edema. Skin exam shows no rashes, ecchymoses or petechia.    Lab Results  Component Value Date   WBC 3.9 12/07/2016   HGB 11.2 (L) 12/07/2016   HCT 34.3 (L) 12/07/2016   MCV 86 12/07/2016   PLT 165 12/07/2016   Lab Results  Component Value Date   FERRITIN 43 06/08/2016   IRON 60 06/08/2016   TIBC 310 06/08/2016   UIBC 250 06/08/2016   IRONPCTSAT 19 (L) 06/08/2016   Lab Results  Component Value Date   RBC 3.97 12/07/2016   No results found for: KPAFRELGTCHN, LAMBDASER, KAPLAMBRATIO No results found for: IGGSERUM, IGA, IGMSERUM No results  found for: Odetta Pink, SPEI   Chemistry      Component Value Date/Time   NA 140 12/07/2016 0836   K 4.5 12/07/2016 0836   CL 106 08/04/2016 0712   CL 103 06/08/2016 0902   CO2 28 12/07/2016 0836   BUN 34.7 (H) 12/07/2016 0836   CREATININE 2.2 (H) 12/07/2016 0836      Component Value Date/Time   CALCIUM 9.3 12/07/2016 0836   ALKPHOS 73 12/07/2016 0836   AST 23 12/07/2016 0836   ALT 21 12/07/2016 0836   BILITOT 0.55 12/07/2016 0836     Impression and Plan: Stephanie Pierce is a 38 yo African American female with DCIS of the right breast diagnosed in July 2015 with lumpectomy. She elected to forego any hormonal therapy. So far, she has done well and there has been no evidence of recurrence. She is doing well and has no complaints at this time.    Hopefully, she will get married next year. She thinks she wants to get married in August. Hopefully, her fianc will agree to this.  Probably not too worried about the lung issue. I don't think that we have to do any scans on her. The last set of scans look like everything was improving. As such, I don't think we are dealing with any malignancy.  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  She is working out which is good. Hopefully she will keep working out.  We will plan to get her back in 6 more months.    Stephanie Napoleon, MD 10/12/20188:17 AM

## 2017-06-11 ENCOUNTER — Encounter: Payer: PRIVATE HEALTH INSURANCE | Admitting: Family Medicine

## 2017-07-05 MED FILL — LOSARTAN POTASSIUM 100 MG T: 100 | 30 days supply | Qty: 30 | Fill #4

## 2017-07-05 MED FILL — SYNTHROID 112 MCG TABLET: 112 | 30 days supply | Qty: 30 | Fill #5

## 2017-07-06 ENCOUNTER — Encounter: Payer: Self-pay | Admitting: Family Medicine

## 2017-07-09 MED ORDER — SYNTHROID 112 MCG PO TABS: 112.0000 ug | ORAL_TABLET | Freq: Every day | ORAL | 2 refills | Status: DC

## 2017-07-18 ENCOUNTER — Other Ambulatory Visit: Payer: Self-pay

## 2017-07-18 MED ORDER — LOSARTAN POTASSIUM 100 MG PO TABS: 100.0000 mg | ORAL_TABLET | Freq: Every day | ORAL | 0 refills | Status: DC

## 2017-07-23 ENCOUNTER — Encounter: Payer: PRIVATE HEALTH INSURANCE | Admitting: Family Medicine

## 2017-08-17 ENCOUNTER — Encounter: Payer: Self-pay | Admitting: Family Medicine

## 2017-09-05 ENCOUNTER — Other Ambulatory Visit: Payer: Self-pay | Admitting: Family Medicine

## 2017-10-05 ENCOUNTER — Other Ambulatory Visit: Payer: Medicare Other

## 2017-10-05 ENCOUNTER — Ambulatory Visit (INDEPENDENT_AMBULATORY_CARE_PROVIDER_SITE_OTHER): Payer: Medicare Other | Admitting: Family Medicine

## 2017-10-05 ENCOUNTER — Other Ambulatory Visit: Payer: Self-pay | Admitting: Hematology & Oncology

## 2017-10-05 VITALS — BP 122/89 | HR 68 | Temp 98.5°F | Resp 18 | Ht 67.0 in | Wt 208.8 lb

## 2017-10-05 DIAGNOSIS — E782 Mixed hyperlipidemia: Secondary | ICD-10-CM

## 2017-10-05 DIAGNOSIS — Z7289 Other problems related to lifestyle: Secondary | ICD-10-CM

## 2017-10-05 DIAGNOSIS — Z9889 Other specified postprocedural states: Secondary | ICD-10-CM

## 2017-10-05 DIAGNOSIS — E559 Vitamin D deficiency, unspecified: Secondary | ICD-10-CM

## 2017-10-05 DIAGNOSIS — T7840XD Allergy, unspecified, subsequent encounter: Secondary | ICD-10-CM | POA: Diagnosis not present

## 2017-10-05 DIAGNOSIS — Z23 Encounter for immunization: Secondary | ICD-10-CM

## 2017-10-05 DIAGNOSIS — E2839 Other primary ovarian failure: Secondary | ICD-10-CM

## 2017-10-05 DIAGNOSIS — N289 Disorder of kidney and ureter, unspecified: Secondary | ICD-10-CM | POA: Diagnosis not present

## 2017-10-05 DIAGNOSIS — Z1211 Encounter for screening for malignant neoplasm of colon: Secondary | ICD-10-CM | POA: Insufficient documentation

## 2017-10-05 DIAGNOSIS — E663 Overweight: Secondary | ICD-10-CM | POA: Diagnosis not present

## 2017-10-05 DIAGNOSIS — R350 Frequency of micturition: Secondary | ICD-10-CM

## 2017-10-05 DIAGNOSIS — Z0000 Encounter for general adult medical examination without abnormal findings: Secondary | ICD-10-CM

## 2017-10-05 DIAGNOSIS — Z853 Personal history of malignant neoplasm of breast: Secondary | ICD-10-CM

## 2017-10-05 DIAGNOSIS — Z9884 Bariatric surgery status: Secondary | ICD-10-CM

## 2017-10-05 DIAGNOSIS — E039 Hypothyroidism, unspecified: Secondary | ICD-10-CM

## 2017-10-05 LAB — COMPREHENSIVE METABOLIC PANEL
ALBUMIN: 3.8 g/dL (ref 3.5–5.2)
ALT: 20 U/L (ref 0–35)
AST: 22 U/L (ref 0–37)
Alkaline Phosphatase: 54 U/L (ref 39–117)
BUN: 38 mg/dL — AB (ref 6–23)
CO2: 30 meq/L (ref 19–32)
CREATININE: 2.03 mg/dL — AB (ref 0.40–1.20)
Calcium: 9 mg/dL (ref 8.4–10.5)
Chloride: 104 mEq/L (ref 96–112)
GFR: 31.56 mL/min — ABNORMAL LOW (ref >60.00)
Glucose, Bld: 86 mg/dL (ref 70–99)
POTASSIUM: 3.9 meq/L (ref 3.5–5.1)
SODIUM: 137 meq/L (ref 135–145)
Total Bilirubin: 0.7 mg/dL (ref 0.2–1.2)
Total Protein: 7.7 g/dL (ref 6.0–8.3)

## 2017-10-05 LAB — URINALYSIS
Bilirubin Urine: NEGATIVE
Hgb urine dipstick: NEGATIVE
Ketones, ur: NEGATIVE
LEUKOCYTES UA: NEGATIVE
Nitrite: NEGATIVE
PH: 7 (ref 5.0–8.0)
SPECIFIC GRAVITY, URINE: 1.01 (ref 1.000–1.030)
URINE GLUCOSE: NEGATIVE
Urobilinogen, UA: 0.2 (ref 0.0–1.0)

## 2017-10-05 LAB — CBC
HEMATOCRIT: 33.7 % — AB (ref 35.0–45.0)
HEMOGLOBIN: 11.1 g/dL — AB (ref 11.7–15.5)
MCH: 27.1 pg (ref 27.0–33.0)
MCHC: 32.9 g/dL (ref 32.0–36.0)
MCV: 82.2 fL (ref 80.0–100.0)
MPV: 11.6 fL (ref 7.5–12.5)
Platelets: 178 10*3/uL (ref 140–400)
RBC: 4.1 10*6/uL (ref 3.80–5.10)
RDW: 13 % (ref 11.0–15.0)
WBC: 3.6 10*3/uL — AB (ref 3.8–10.8)

## 2017-10-05 LAB — LIPID PANEL
CHOL/HDL RATIO: 2
Cholesterol: 235 mg/dL — ABNORMAL HIGH (ref 0–200)
HDL: 104.2 mg/dL (ref >39.00)
LDL CALC: 123 mg/dL — AB (ref 0–99)
NonHDL: 130.48
Triglycerides: 38 mg/dL (ref 0.0–149.0)
VLDL: 7.6 mg/dL (ref 0.0–40.0)

## 2017-10-05 LAB — TSH: TSH: 0.85 u[IU]/mL (ref 0.35–4.50)

## 2017-10-05 LAB — VITAMIN D 25 HYDROXY (VIT D DEFICIENCY, FRACTURES): VITD: 50.56 ng/mL (ref 30.00–100.00)

## 2017-10-05 LAB — MAGNESIUM: Magnesium: 2.1 mg/dL (ref 1.5–2.5)

## 2017-10-05 MED ORDER — CALCIUM CITRATE-VITAMIN D 500-500 MG-UNIT PO CHEW: 1.0000 | CHEWABLE_TABLET | Freq: Every day | ORAL | 3 refills | Status: DC

## 2017-10-05 MED ORDER — VITAMIN D3 250 MCG (10000 UT) PO CAPS: 10000.0000 [IU] | ORAL_CAPSULE | ORAL | 5 refills | Status: DC

## 2017-10-05 MED ORDER — B-12 2500 MCG PO TABS: 1.0000 | ORAL_TABLET | ORAL | 5 refills | Status: AC

## 2017-10-05 NOTE — Assessment & Plan Note (Signed)
Encouraged heart healthy diet, increase exercise, avoid trans fats, consider a krill oil cap daily 

## 2017-10-05 NOTE — Patient Instructions (Addendum)
Shingrix is the new shingles shot, 2 shots over 2-6 months at he [place] makes a good nasal saline with glycerin. [email or web address]  Stephanie Pierce , Thank you for taking time to come for your Medicare Wellness Visit. I appreciate your ongoing commitment to your health goals. Please review the following plan we discussed and let me know if I can assist you in the future.   These are the goals we discussed: Goals    None      This is a list of the screening recommended for you and due dates:  Health Maintenance  Topic Date Due  .  Hepatitis C: One time screening is recommended by Center for Disease Control  (CDC) for  adults born from [birth year] through 1964.   November 14, 1950  . HIV Screening  03/14/1967  . DEXA scan (bone density measurement)  03/13/2017  . Pneumonia vaccines (2 of 2 - PPSV23) 03/13/2017  . Pap Smear  11/26/2017  . Mammogram  11/04/2018  . Colon Cancer Screening  05/30/2025  . Tetanus Vaccine  02/02/2026  . Flu Shot  Completed

## 2017-10-05 NOTE — Assessment & Plan Note (Signed)
Encouraged DASH diet, decrease po intake and increase exercise as tolerated. Needs 7-8 hours of sleep nightly. Avoid trans fats, eat small, frequent meals every 4-5 hours with lean proteins, complex carbs and healthy fats. minimize simple carbs

## 2017-10-05 NOTE — Progress Notes (Signed)
Subjective:  I acted as a Education administrator for Dr. Charlett Blake. [name], [place]  Patient ID: Stephanie Pierce, female    DOB: [birth year], 67 y.o.   MRN: [id number]  No chief complaint on file.   HPI  Patient is in today for a welcome to Medicare visit and follow up on chronic medical conditions including hyperlipidemia, hypothyroidism, obesity s/o gastric sleeve in 2012, DCIS. She feels well. She is working from home. No difficulties with ADLs. Tries to stay active and maintain a heart healthy diet. No recent febrile illness or hospitalizations. Denies CP/palp/SOB/HA/congestion/fevers/GI or GU c/o. Taking meds as prescribed  Patient Care Team: Mosie Lukes, MD as PCP - General (Family Medicine)   Past Medical History:  Diagnosis Date  . Allergic state 03/23/2015  . Allergy   . Breast fibrocystic disorder   . Cancer ([place])   . DCIS (ductal carcinoma in situ) of breast   . 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 Enlarged thyroid gland   . H/O iron deficiency anemia 03/23/2015  . History of chicken pox 03/23/2015  . Hyperlipidemia, mixed 03/23/2015  . Hypothyroidism 03/23/2015  . Overweight 03/23/2015  . Pneumonia 2012  . Renal insufficiency 03/23/2015  . S/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  . Vitamin D deficiency 05/12/2015    Past Surgical History:  Procedure Laterality Date  . BREAST LUMPECTOMY     h/o 2 needle biopsy, in 2015 right Lumpectomy DCIS stage 0  . CESAREAN SECTION  1985  . LAPAROSCOPIC GASTRIC SLEEVE RESECTION      Family History  Problem Relation Age of Onset  . Hypertension Father   . Heart attack Father   . Alcohol abuse Father   . Diabetes Brother   . Other Brother        complications of exposure to agent orange  . Stroke Brother   . Diverticulosis Daughter   . Diabetes Sister   . Hypertension Sister     Social History   Socioeconomic History  . Marital status: Divorced    Spouse name: Not on file  . Number of children:  Not on file  . Years of education: 28  . Highest education level: Not on file  Social Needs  . Financial resource strain: Not on file  . Food insecurity - worry: Not on file  . Food insecurity - inability: Not on file  . Transportation needs - medical: Not on file  . Transportation needs - non-medical: Not on file  Occupational History  . Occupation: Chief Strategy Officer  Tobacco Use  . Smoking status: Never Smoker  . Smokeless tobacco: Never Used  Substance and Sexual Activity  . Alcohol use: No    Alcohol/week: 0.0 oz  . Drug use: No  . 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 Not on file  Social History Narrative  . Not on file    Outpatient Medications Prior to Visit  Medication Sig Dispense Refill  . KRILL OIL OMEGA-3 PO Take by mouth. Take 2 daily    . losartan (COZAAR) 100 MG tablet TAKE 1 TABLET DAILY 60 tablet 0  . Multiple Vitamins-Minerals (CENTRUM SILVER ADULT 50+ PO) Take 1 tablet by mouth every other day.    Marland Kitchen SYNTHROID 112 MCG tablet Take 1 tablet (112 mcg total) daily before  breakfast by mouth. 90 tablet 2  . Calcium Citrate-Vitamin D (CALCIUM CITRATE CHEWY BITE) 500-500 MG-UNIT CHEW Chew by mouth daily.    . Cholecalciferol (VITAMIN D3) 10000 UNITS capsule Take 10,000 Units by mouth every other day.    . Cyanocobalamin (B-12) 2500 MCG TABS Take 1 tablet by mouth once a week.     No facility-administered medications prior to visit.     Allergies  Allergen Reactions  . Iodine Hives  . Lisinopril Swelling    Swelling in the throat  . Penicillins Rash    Review of Systems  Constitutional: Negative for chills, fever and malaise/fatigue.  HENT: Negative for congestion and hearing loss.   Eyes: Negative for discharge.  Respiratory: Negative for cough, sputum production and shortness of breath.   Cardiovascular: Negative for  chest pain, palpitations and leg swelling.  Gastrointestinal: Negative for abdominal pain, blood in stool, constipation, diarrhea, heartburn, nausea and vomiting.  Genitourinary: Negative for dysuria, frequency, hematuria and urgency.  Musculoskeletal: Negative for back pain, falls and myalgias.  Skin: Negative for rash.  Neurological: Negative for dizziness, sensory change, loss of consciousness, weakness and headaches.  Endo/Heme/Allergies: Negative for environmental allergies. Does not bruise/bleed easily.  Psychiatric/Behavioral: Negative for depression and suicidal ideas. The patient is not nervous/anxious and does not have insomnia.        Objective:    Physical Exam  Constitutional: She is oriented to person, place, and time. She appears well-developed and well-nourished. No distress.  HENT:  Head: Normocephalic and atraumatic.  Eyes: Conjunctivae are normal.  Neck: Neck supple. No thyromegaly present.  Cardiovascular: Normal rate, regular rhythm and normal heart sounds.  No murmur heard. Pulmonary/Chest: Effort normal and breath sounds normal. No respiratory distress.  Abdominal: Soft. Bowel sounds are normal. She exhibits no distension and no mass. There is no tenderness.  Musculoskeletal: She exhibits no edema.  Lymphadenopathy:    She has no cervical adenopathy.  Neurological: She is alert and oriented to person, place, and time.  Skin: Skin is warm and dry.  Psychiatric: She has a normal mood and affect. Her behavior is normal.    BP 122/89 (BP Location: Left Arm, Patient Position: Sitting, Cuff Size: Normal)   Pulse 68   Temp 98.5 F (36.9 C) (Oral)   Resp 18   Ht 5' 7"  (1.702 m)   Wt 208 lb 12.8 oz (94.7 kg)   SpO2 98%   BMI 32.70 kg/m  Wt Readings from Last 3 Encounters:  10/05/17 208 lb 12.8 oz (94.7 kg)  12/07/16 205 lb 1.9 oz (93 kg)  08/15/16 199 lb 12.8 oz (90.6 kg)   BP Readings from Last 3 Encounters:  10/05/17 122/89  12/07/16 131/82  08/15/16  128/78     Immunization History  Administered Date(s) Administered  . Influenza,inj,Quad PF,6+ Mos 06/08/2016  . Influenza-Unspecified 05/28/2014, 05/29/2015, 06/08/2016  . Pneumococcal Conjugate-13 02/03/2016  . Pneumococcal Polysaccharide-23 10/05/2017  . Pneumococcal-Unspecified 08/28/2010  . Tdap 02/03/2016  . Zoster 03/23/2015    Health Maintenance  Topic Date Due  . HIV Screening  03/14/1967  . DEXA SCAN  03/13/2017  . PAP SMEAR  11/26/2017  . MAMMOGRAM  11/04/2018  . COLONOSCOPY  05/30/2025  . TETANUS/TDAP  02/02/2026  . INFLUENZA VACCINE  Completed  . Hepatitis C Screening  Completed  . PNA vac Low Risk Adult  Completed    Lab Results  Component Value Date   WBC 3.6 (L) 10/05/2017   HGB 11.1 (L) 10/05/2017   HCT  33.7 (L) 10/05/2017   PLT 178 10/05/2017   GLUCOSE 86 10/05/2017   CHOL 235 (H) 10/05/2017   TRIG 38.0 10/05/2017   HDL 104.20 10/05/2017   LDLCALC 123 (H) 10/05/2017   ALT 20 10/05/2017   AST 22 10/05/2017   NA 137 10/05/2017   K 3.9 10/05/2017   CL 104 10/05/2017   CREATININE 2.03 (H) 10/05/2017   BUN 38 (H) 10/05/2017   CO2 30 10/05/2017   TSH 0.85 10/05/2017    Lab Results  Component Value Date   TSH 0.85 10/05/2017   Lab Results  Component Value Date   WBC 3.6 (L) 10/05/2017   HGB 11.1 (L) 10/05/2017   HCT 33.7 (L) 10/05/2017   MCV 82.2 10/05/2017   PLT 178 10/05/2017   Lab Results  Component Value Date   NA 137 10/05/2017   K 3.9 10/05/2017   CHLORIDE 105 12/07/2016   CO2 30 10/05/2017   GLUCOSE 86 10/05/2017   BUN 38 (H) 10/05/2017   CREATININE 2.03 (H) 10/05/2017   BILITOT 0.7 10/05/2017   ALKPHOS 54 10/05/2017   AST 22 10/05/2017   ALT 20 10/05/2017   PROT 7.7 10/05/2017   ALBUMIN 3.8 10/05/2017   CALCIUM 9.0 10/05/2017   ANIONGAP 8 12/07/2016   EGFR 26 (L) 12/07/2016   GFR 31.56 (L) 10/05/2017   Lab Results  Component Value Date   CHOL 235 (H) 10/05/2017   Lab Results  Component Value Date   HDL 104.20  10/05/2017   Lab Results  Component Value Date   LDLCALC 123 (H) 10/05/2017   Lab Results  Component Value Date   TRIG 38.0 10/05/2017   Lab Results  Component Value Date   CHOLHDL 2 10/05/2017   No results found for: HGBA1C       Assessment & Plan:   Problem List Items Addressed This Visit    Vitamin D deficiency (Chronic)    Continue supplements and check levels      Relevant Orders   VITAMIN D 25 Hydroxy (Vit-D Deficiency, Fractures) (Completed)   Hyperlipidemia, mixed    Encouraged heart healthy diet, increase exercise, avoid trans fats, consider a krill oil cap daily      Relevant Orders   Lipid panel (Completed)   Renal insufficiency    Check cmp today      S/P gastric surgery    [place] gastric [place] and has struggled with weight gain, does not have anyone following her labs. Labs checked today and [facility] is [facility] to [facility]      Overweight    Encouraged DASH diet, decrease po intake and increase exercise as tolerated. Needs 7-8 hours of sleep nightly. Avoid trans fats, eat small, frequent meals every 4-5 hours with lean proteins, complex carbs and healthy fats. minimize simple carbs      Allergic state    Has had a chronic nasal drainage and thus a cough since moving to [place]. Encouraged Zyrtec, Flonase, nasal saline      Relevant Orders   CBC   Hypothyroidism    On Levothyroxine, continue to monitor      Relevant Orders   TSH (Completed)   Medicare welcome visit    Patient denies any difficulties at home. No trouble with ADLs, depression or falls. See EMR for functional status screen and depression screen. No recent changes to vision or hearing. Is UTD with immunizations. Is UTD with screening. Discussed Advanced Directives. Encouraged heart healthy  diet, exercise as tolerated and adequate sleep. See patient's problem list for health risk factors to monitor. See AVS for preventative healthcare recommendation  schedule. Check labs  Performed cologuard testing in September 2016. Negative result. Will repeat in September 2019       Relevant Orders   Hepatitis C antibody (Completed)    Other Visit Diagnoses    Hx of gastric bypass    -  Primary   Relevant Orders   VITAMIN D 25 Hydroxy (Vit-D Deficiency, Fractures) (Completed)   Magnesium (Completed)   Zinc   Preventative health care       Relevant Orders   Comprehensive metabolic panel (Completed)   Hepatitis C antibody (Completed)   Estrogen deficiency       Relevant Orders   DG Bone Density   Other problems related to lifestyle       Relevant Orders   Hepatitis C antibody (Completed)   Urinary frequency       Relevant Orders   Urinalysis (Completed)   Urine Culture (Completed)      I have changed Petronella A. Ure's calcium citrate-vitamin D and Vitamin D3. I am also having her maintain her Multiple Vitamins-Minerals (CENTRUM SILVER ADULT 50+ PO), KRILL OIL OMEGA-3 PO, SYNTHROID, losartan, and B-12.  Meds ordered this encounter  Medications  . calcium citrate-vitamin D (CALCIUM CITRATE CHEWY BITE) 500-500 MG-UNIT chewable tablet    Sig: Chew 1 tablet by mouth daily.    Dispense:  30 tablet    Refill:  3  . Cyanocobalamin (B-12) 2500 MCG TABS    Sig: Take 1 tablet by mouth once a week.    Dispense:  12 tablet    Refill:  5  . Cholecalciferol (VITAMIN D3) 10000 units capsule    Sig: Take 1 capsule (10,000 Units total) by mouth every other day.    Dispense:  12 capsule    Refill:  5    CMA served as scribe during this visit. History, Physical and Plan performed by medical provider. Documentation and orders reviewed and attested to.  Penni Homans, MD

## 2017-10-05 NOTE — Assessment & Plan Note (Addendum)
Patient denies any difficulties at home. No trouble with ADLs, depression or falls. See EMR for functional status screen and depression screen. No recent changes to vision or hearing. Is UTD with immunizations. Is UTD with screening. Discussed Advanced Directives. Encouraged heart healthy diet, exercise as tolerated and adequate sleep. See patient's problem list for health risk factors to monitor. See AVS for preventative healthcare recommendation schedule. Check labs  Performed cologuard testing in September 2016. Negative result. Will repeat in September 2019

## 2017-10-05 NOTE — Assessment & Plan Note (Signed)
On Levothyroxine, continue to monitor 

## 2017-10-05 NOTE — Assessment & Plan Note (Signed)
Continue supplements and check levels

## 2017-10-05 NOTE — Assessment & Plan Note (Signed)
Check cmp today 

## 2017-10-05 NOTE — Assessment & Plan Note (Signed)
Has had a chronic nasal drainage and thus a cough since moving to [place]. Encouraged Zyrtec, Flonase, nasal saline

## 2017-10-06 LAB — URINE CULTURE
MICRO NUMBER: 90172709
Result:: NO GROWTH
SPECIMEN QUALITY: ADEQUATE

## 2017-10-07 NOTE — Assessment & Plan Note (Signed)
Had gastric [place] in [place] and has struggled with weight gain, does not have anyone following her labs. Labs checked today and she is referred to [facility]

## 2017-10-09 DIAGNOSIS — Z862 Personal history of diseases of the blood and blood-forming organs and certain disorders involving the immune mechanism: Secondary | ICD-10-CM

## 2017-10-09 LAB — ZINC: Zinc: 69 ug/dL (ref 60–130)

## 2017-10-09 LAB — HEPATITIS C ANTIBODY
HEP C AB: NONREACTIVE
SIGNAL TO CUT-OFF: 0.03 (ref <1.00)

## 2017-10-23 ENCOUNTER — Encounter: Payer: Self-pay | Admitting: Family Medicine

## 2017-10-23 ENCOUNTER — Telehealth: Payer: Self-pay | Admitting: Family Medicine

## 2017-10-23 MED ORDER — FERROUS FUMARATE-FOLIC ACID 324-1 MG PO TABS: 1.0000 | ORAL_TABLET | Freq: Every day | ORAL | 1 refills | Status: DC

## 2017-10-23 NOTE — Telephone Encounter (Signed)
Copied from [place] [phone]. Topic: Quick Communication - Rx Refill/Question >> Oct 23, 2017 12:09 PM [name] wrote: Pt received a letter about labs and noticed the Rx mentioned in the letter and pt called pharmacy but the pharmacy has no recollection of getting informed to fill this Rx, contact pt to advise  Medication: hemocyte F   Has the patient contacted their pharmacy? Yes.     (Agent: If no, request that the patient contact the pharmacy for the refill.)   Preferred Pharm[facility] (with phone number [facility]   Agent: Please be advised that RX refills may take up to 3 business days. We ask that you follow-up with your pharmacy.

## 2017-10-24 NOTE — Telephone Encounter (Signed)
Medication filled per e-mail note to patient by Dr. Charlett Blake 10/23/17.

## 2017-10-25 ENCOUNTER — Encounter: Payer: Self-pay | Admitting: Family Medicine

## 2017-10-25 ENCOUNTER — Other Ambulatory Visit: Payer: Self-pay | Admitting: Family Medicine

## 2017-10-25 DIAGNOSIS — H40033 Anatomical narrow angle, bilateral: Secondary | ICD-10-CM | POA: Diagnosis not present

## 2017-10-25 MED ORDER — FERROUS FUMARATE-FOLIC ACID 324-1 MG PO TABS: 1.0000 | ORAL_TABLET | Freq: Every day | ORAL | 1 refills | Status: DC

## 2017-10-25 MED FILL — LOSARTAN POTASSIUM 100 MG T: 100 | 30 days supply | Qty: 30 | Fill #5

## 2017-10-26 MED FILL — HEMATINIC-FOLIC ACID TABLET: 324-1 | 90 days supply | Qty: 90 | Fill #0

## 2017-10-31 ENCOUNTER — Other Ambulatory Visit (INDEPENDENT_AMBULATORY_CARE_PROVIDER_SITE_OTHER): Payer: Medicare Other

## 2017-10-31 DIAGNOSIS — Z862 Personal history of diseases of the blood and blood-forming organs and certain disorders involving the immune mechanism: Secondary | ICD-10-CM | POA: Diagnosis not present

## 2017-10-31 LAB — FECAL OCCULT BLOOD, IMMUNOCHEMICAL: Fecal Occult Bld: NEGATIVE

## 2017-10-31 NOTE — Addendum Note (Signed)
Addended by: Trenda Moots on: 09/28/6242 09:14 AM   Modules accepted: Orders

## 2017-11-09 ENCOUNTER — Ambulatory Visit
Admission: RE | Admit: 2017-11-09 | Discharge: 2017-11-09 | Disposition: A | Discharge status: Home / Self Care | Payer: Medicare Other | Source: Ambulatory Visit | Attending: Hematology & Oncology | Admitting: Hematology & Oncology

## 2017-11-09 ENCOUNTER — Ambulatory Visit
Admission: RE | Admit: 2017-11-09 | Discharge: 2017-11-09 | Disposition: A | Discharge status: Home / Self Care | Payer: Medicare Other | Source: Ambulatory Visit | Attending: Family Medicine | Admitting: Family Medicine

## 2017-11-09 DIAGNOSIS — Z13820 Encounter for screening for osteoporosis: Secondary | ICD-10-CM | POA: Diagnosis not present

## 2017-11-09 DIAGNOSIS — Z853 Personal history of malignant neoplasm of breast: Secondary | ICD-10-CM

## 2017-11-09 DIAGNOSIS — R928 Other abnormal and inconclusive findings on diagnostic imaging of breast: Secondary | ICD-10-CM | POA: Diagnosis not present

## 2017-11-09 DIAGNOSIS — Z780 Asymptomatic menopausal state: Secondary | ICD-10-CM | POA: Diagnosis not present

## 2017-11-09 DIAGNOSIS — E2839 Other primary ovarian failure: Secondary | ICD-10-CM

## 2017-11-09 HISTORY — DX: Personal history of irradiation: Z92.3

## 2017-11-12 ENCOUNTER — Encounter: Payer: Self-pay | Admitting: Family Medicine

## 2017-11-15 ENCOUNTER — Encounter (INDEPENDENT_AMBULATORY_CARE_PROVIDER_SITE_OTHER): Payer: Medicare Other

## 2017-11-21 ENCOUNTER — Ambulatory Visit (INDEPENDENT_AMBULATORY_CARE_PROVIDER_SITE_OTHER): Payer: Medicare Other | Admitting: Family Medicine

## 2017-11-22 ENCOUNTER — Encounter: Payer: Self-pay | Admitting: Family Medicine

## 2017-11-23 NOTE — Telephone Encounter (Signed)
Pt. Is asking for doctor's note if can be fax# [phone] Also please put on mychart

## 2017-11-26 ENCOUNTER — Encounter: Payer: Self-pay | Admitting: Family Medicine

## 2017-11-26 MED FILL — LOSARTAN POTASSIUM 100 MG T: 100 | 30 days supply | Qty: 30 | Fill #6

## 2017-11-30 NOTE — Telephone Encounter (Signed)
Done

## 2017-12-21 DIAGNOSIS — H10413 Chronic giant papillary conjunctivitis, bilateral: Secondary | ICD-10-CM | POA: Diagnosis not present

## 2017-12-21 DIAGNOSIS — H04123 Dry eye syndrome of bilateral lacrimal glands: Secondary | ICD-10-CM | POA: Diagnosis not present

## 2017-12-21 DIAGNOSIS — H16403 Unspecified corneal neovascularization, bilateral: Secondary | ICD-10-CM | POA: Diagnosis not present

## 2017-12-21 DIAGNOSIS — H2513 Age-related nuclear cataract, bilateral: Secondary | ICD-10-CM | POA: Diagnosis not present

## 2017-12-21 DIAGNOSIS — H40033 Anatomical narrow angle, bilateral: Secondary | ICD-10-CM | POA: Diagnosis not present

## 2017-12-26 MED FILL — LOSARTAN POTASSIUM 100 MG T: 100 | 30 days supply | Qty: 30 | Fill #7

## 2018-01-14 MED FILL — SYNTHROID 112 MCG TABLET: 112 | 30 days supply | Qty: 30 | Fill #6

## 2018-01-28 MED FILL — LOSARTAN POTASSIUM 100 MG T: 100 | 30 days supply | Qty: 30 | Fill #8

## 2018-02-13 MED FILL — SYNTHROID 112 MCG TABLET: 112 | 30 days supply | Qty: 30 | Fill #7

## 2018-02-15 ENCOUNTER — Encounter: Payer: Self-pay | Admitting: Medical

## 2018-02-15 ENCOUNTER — Ambulatory Visit (INDEPENDENT_AMBULATORY_CARE_PROVIDER_SITE_OTHER): Payer: Medicare Other | Admitting: Medical

## 2018-02-15 VITALS — BP 123/83 | HR 86 | Temp 97.7°F | Resp 16 | Ht 67.0 in | Wt 209.6 lb

## 2018-02-15 DIAGNOSIS — H6690 Otitis media, unspecified, unspecified ear: Secondary | ICD-10-CM | POA: Diagnosis not present

## 2018-02-15 DIAGNOSIS — R0981 Nasal congestion: Secondary | ICD-10-CM

## 2018-02-15 DIAGNOSIS — J309 Allergic rhinitis, unspecified: Secondary | ICD-10-CM

## 2018-02-15 MED ORDER — MONTELUKAST SODIUM 10 MG PO TABS: 10.0000 mg | ORAL_TABLET | Freq: Every day | ORAL | 3 refills | Status: DC

## 2018-02-15 MED ORDER — AZITHROMYCIN 250 MG PO TABS: ORAL_TABLET | ORAL | 0 refills | Status: DC

## 2018-02-15 MED ORDER — LEVOCETIRIZINE DIHYDROCHLORIDE 5 MG PO TABS: 5.0000 mg | ORAL_TABLET | Freq: Every evening | ORAL | 3 refills | Status: DC

## 2018-02-15 MED ORDER — FLUTICASONE PROPIONATE 50 MCG/ACT NA SUSP: 2.0000 | Freq: Every day | NASAL | 2 refills | Status: DC

## 2018-02-15 MED FILL — AZITHROMYCIN 250 MG TABLET: 250 | 5 days supply | Qty: 6 | Fill #0

## 2018-02-15 MED FILL — MONTELUKAST SOD 10 MG TAB: 10 | 30 days supply | Qty: 30 | Fill #0

## 2018-02-15 MED FILL — LEVOCETIRIZINE 5 MG TABLET: 5 | 30 days supply | Qty: 30 | Fill #0

## 2018-02-15 MED FILL — FLUTICASONE PROP 50 MCG SPR: 50 | 30 days supply | Qty: 16 | Fill #0

## 2018-02-15 NOTE — Patient Instructions (Addendum)
You appear to have some chronic intermittent allergic rhinitis symptoms.  Recently it appears that the nasal congestion has proceeded early right ear infection.  I do recommend that you start Xyzal and Singulair daily.  Also use your Flonase daily.  Prescription sent to your pharmacy.  We will see how these work over the next 3 months.  If you find that this does help you might need to be on this regimen year-round.  Prescription of a azithromycin given as well.  Recommend start that today.  Follow-up in 7 days or as needed.

## 2018-02-15 NOTE — Progress Notes (Signed)
Subjective:    Patient ID: Stephanie Pierce, female    DOB: [birth year], 67 y.o.   MRN: [id number]  HPI  Pt in with some stopped up rt ear since Tuesday morning. Feel like on and off ear opening up. Has not been swimming. No correlation of symptoms with water exposure/showering. Has some sneezing recently. She thinks allergies. Some improvement of ear pop/ ear pressure sensation to rt ear. Responds to zyrtec d recently. Some sneezing recently, rt nares runny nose and dry cough.   Over past 2 years states sneezing and coughing since living in [place].   Review of Systems  Constitutional: Negative for chills, fatigue and fever.  HENT: Positive for congestion, postnasal drip, rhinorrhea and sneezing. Negative for sore throat and trouble swallowing.        Rt ear discomfort.  Respiratory: Positive for cough. Negative for chest tightness, shortness of breath and wheezing.   Cardiovascular: Negative for chest pain and palpitations.  Gastrointestinal: Negative for abdominal pain.  Musculoskeletal: Negative for arthralgias and back pain.  Skin: Negative for pallor and rash.  Neurological: Negative for dizziness, seizures, weakness, numbness and headaches.  Hematological: Negative for adenopathy. Does not bruise/bleed easily.  Psychiatric/Behavioral: Negative for agitation, behavioral problems and sleep disturbance. The patient is not nervous/anxious.      Past Medical History:  Diagnosis Date  . Allergic state 03/23/2015  . Allergy   . Breast fibrocystic disorder   . Cancer ([place])   . DCIS (ductal carcinoma in situ) of breast   . 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 Enlarged thyroid gland   . H/O iron deficiency anemia 03/23/2015  . History of chicken pox 03/23/2015  . Hyperlipidemia, mixed 03/23/2015  . Hypothyroidism 03/23/2015  . Overweight 03/23/2015  . Personal history of radiation therapy   . Pneumonia 2012  . Renal insufficiency 03/23/2015  . S/P gastric  surgery 03/23/2015   Gastric sleeve  . Vitamin D deficiency 05/12/2015     Social History   Socioeconomic History  . Marital status: Divorced    Spouse name: Not on file  . Number of children: Not on file  . Years of education: 41  . Highest education level: Not on file  Occupational History  . Occupation: Chief Strategy Officer  Social Needs  . Financial resource strain: Not on file  . Food insecurity:    Worry: Not on file    Inability: Not on file  . Transportation needs:    Medical: Not on file    Non-medical: Not on file  Tobacco Use  . Smoking status: Never Smoker  . Smokeless tobacco: Never Used  Substance and Sexual Activity  . Alcohol use: No    Alcohol/week: 0.0 oz  . Drug use: No  . 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 Physical activity:    Days per week: Not on file    Minutes per session: Not on file  . Stress: Not on file  Relationships  . Social connections:    Talks on phone: Not on file    Gets together: Not on file    Attends religious service: Not on file    Active member of club or organization: Not on file    Attends meetings of clubs or organizations: Not on file    Relationship status: Not  on file  . Intimate partner violence:    Fear of current or ex partner: Not on file    Emotionally abused: Not on file    Physically abused: Not on file    Forced sexual activity: Not on file  Other Topics Concern  . Not on file  Social History Narrative  . Not on file    Past Surgical History:  Procedure Laterality Date  . BREAST LUMPECTOMY     h/o 2 needle biopsy, in 2015 right Lumpectomy DCIS stage 0  . CESAREAN SECTION  1985  . LAPAROSCOPIC GASTRIC SLEEVE RESECTION      Family History  Problem Relation Age of Onset  . Hypertension Father   . Heart attack Father   . Alcohol abuse Father   . Diabetes Brother   . Other  Brother        complications of exposure to agent orange  . Stroke Brother   . Diverticulosis Daughter   . Diabetes Sister   . Hypertension Sister     Allergies  Allergen Reactions  . Iodine Hives  . Lisinopril Swelling    Swelling in the throat  . Penicillins Rash    Current Outpatient Medications on File Prior to Visit  Medication Sig Dispense Refill  . calcium citrate-vitamin D (CALCIUM CITRATE CHEWY BITE) 500-500 MG-UNIT chewable tablet Chew 1 tablet by mouth daily. 30 tablet 3  . Cholecalciferol (VITAMIN D3) 10000 units capsule Take 1 capsule (10,000 Units total) by mouth every other day. 12 capsule 5  . Cyanocobalamin (B-12) 2500 MCG TABS Take 1 tablet by mouth once a week. 12 tablet 5  . Ferrous Fumarate-Folic Acid (HEMOCYTE-F) 324-1 MG TABS Take 1 tablet by mouth daily. 90 each 1  . KRILL OIL OMEGA-3 PO Take by mouth. Take 2 daily    . losartan (COZAAR) 100 MG tablet TAKE 1 TABLET DAILY 60 tablet 0  . Multiple Vitamins-Minerals (CENTRUM SILVER ADULT 50+ PO) Take 1 tablet by mouth every other day.    Marland Kitchen SYNTHROID 112 MCG tablet Take 1 tablet (112 mcg total) daily before breakfast by mouth. 90 tablet 2   No current facility-administered medications on file prior to visit.     BP 123/83   Pulse 86   Temp 97.7 F (36.5 C) (Oral)   Resp 16   Ht 5\' 7"  (1.702 m)   Wt 209 lb 9.6 oz (95.1 kg)   SpO2 99%   BMI 32.83 kg/m       Objective:   Physical Exam  General  Mental Status - Alert. General Appearance - Well groomed. Not in acute distress.  Skin Rashes- No Rashes.  HEENT Head- Normal. Ear Auditory Canal - Left- Normal. Right - Normal.Tympanic Membrane- Left- Normal. Right- moderate pink /red. Eye Sclera/Conjunctiva- Left- Normal. Right- Normal. Nose & Sinuses Nasal Mucosa- Left-  Boggy and Congested. Right-  Boggy and  Congested.Bilateral maxillary and frontal sinus pressure. Mouth & Throat Lips: Upper Lip- Normal: no dryness, cracking, pallor, cyanosis, or  vesicular eruption. Lower Lip-Normal: no dryness, cracking, pallor, cyanosis or vesicular eruption. Buccal Mucosa- Bilateral- No Aphthous ulcers. Oropharynx- No Discharge or Erythema. Tonsils: Characteristics- Bilateral- No Erythema or Congestion. Size/Enlargement- Bilateral- No enlargement. Discharge- bilateral-None.  Neck Neck- Supple. No Masses.   Chest and Lung Exam Auscultation: Breath Sounds:-Clear even and unlabored.  Cardiovascular Auscultation:Rythm- Regular, rate and rhythm. Murmurs & Other Heart Sounds:Ausculatation of the heart reveal- No Murmurs.  Lymphatic Head & Neck General Head & Neck Lymphatics: Bilateral: Description-  No Localized lymphadenopathy.       Assessment & Plan:  You appear to have some chronic intermittent allergic rhinitis symptoms.  Recently it appears that the nasal congestion has proceeded early right ear infection.  I do recommend that you start Xyzal and Singulair daily.  Also use your Flonase daily.  Prescription sent to your pharmacy.  We will see how these work over the next 3 months.  If you find that this does help you might need to be on this regimen year-round.  Prescription of a azithromycin given as well.  Recommend start that today.  Follow-up in 7 days or as needed.

## 2018-02-23 ENCOUNTER — Encounter: Payer: Self-pay | Admitting: Internal Medicine

## 2018-02-23 ENCOUNTER — Ambulatory Visit (INDEPENDENT_AMBULATORY_CARE_PROVIDER_SITE_OTHER): Payer: Medicare Other | Admitting: Internal Medicine

## 2018-02-23 VITALS — BP 130/88 | HR 78 | Temp 98.0°F | Ht 67.0 in | Wt 209.0 lb

## 2018-02-23 DIAGNOSIS — H6981 Other specified disorders of Eustachian tube, right ear: Secondary | ICD-10-CM

## 2018-02-23 DIAGNOSIS — H6980 Other specified disorders of Eustachian tube, unspecified ear: Secondary | ICD-10-CM | POA: Insufficient documentation

## 2018-02-23 MED ORDER — PREDNISONE 20 MG PO TABS: 40.0000 mg | ORAL_TABLET | Freq: Every day | ORAL | 0 refills | Status: DC

## 2018-02-23 NOTE — Assessment & Plan Note (Signed)
[facility] of [facility] Discussed that it should improve over time Will try 3 days of prednisone to speed the process Add back the xyzal

## 2018-02-23 NOTE — Progress Notes (Signed)
Subjective:    Patient ID: Stephanie Pierce, female    DOB: [birth year], 67 y.o.   MRN: [id number]  HPI Here due to right ear fullness  Seen over a week ago z-pak for right ear infection Seemed to be improving but ongoing symptoms now Continues trying peroxide  Clogs ----may improve by leaning down Gets sense of something running out of her ear--but nothing there  No fever Doesn't feel sick Cough better now with the singulair--but not taking the xyzal  Current Outpatient Medications on File Prior to Visit  Medication Sig Dispense Refill  . azithromycin (ZITHROMAX) 250 MG tablet Take 2 tablets by mouth on day 1, followed by 1 tablet by mouth daily for 4 days. 6 tablet 0  . calcium citrate-vitamin D (CALCIUM CITRATE CHEWY BITE) 500-500 MG-UNIT chewable tablet Chew 1 tablet by mouth daily. 30 tablet 3  . Cholecalciferol (VITAMIN D3) 10000 units capsule Take 1 capsule (10,000 Units total) by mouth every other day. 12 capsule 5  . Cyanocobalamin (B-12) 2500 MCG TABS Take 1 tablet by mouth once a week. 12 tablet 5  . Ferrous Fumarate-Folic Acid (HEMOCYTE-F) 324-1 MG TABS Take 1 tablet by mouth daily. 90 each 1  . fluticasone (FLONASE) 50 MCG/ACT nasal spray Place 2 sprays into both nostrils daily. 16 g 2  . KRILL OIL OMEGA-3 PO Take by mouth. Take 2 daily    . levocetirizine (XYZAL) 5 MG tablet Take 1 tablet (5 mg total) by mouth every evening. 30 tablet 3  . losartan (COZAAR) 100 MG tablet TAKE 1 TABLET DAILY 60 tablet 0  . montelukast (SINGULAIR) 10 MG tablet Take 1 tablet (10 mg total) by mouth at bedtime. 30 tablet 3  . Multiple Vitamins-Minerals (CENTRUM SILVER ADULT 50+ PO) Take 1 tablet by mouth every other day.    Marland Kitchen SYNTHROID 112 MCG tablet Take 1 tablet (112 mcg total) daily before breakfast by mouth. 90 tablet 2   No current facility-administered medications on file prior to visit.     Allergies  Allergen Reactions  . Iodine Hives  . Lisinopril Swelling    Swelling in  the throat  . Penicillins Rash    Past Medical History:  Diagnosis Date  . Allergic state 03/23/2015  . Allergy   . Breast fibrocystic disorder   . Cancer ([place])   . DCIS (ductal carcinoma in situ) of breast   . 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 Enlarged thyroid gland   . H/O iron deficiency anemia 03/23/2015  . History of chicken pox 03/23/2015  . Hyperlipidemia, mixed 03/23/2015  . Hypothyroidism 03/23/2015  . Overweight 03/23/2015  . Personal history of radiation therapy   . Pneumonia 2012  . Renal insufficiency 03/23/2015  . S/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  . Vitamin D deficiency 05/12/2015    Past Surgical History:  Procedure Laterality Date  . BREAST LUMPECTOMY     h/o 2 needle biopsy, in 2015 right Lumpectomy DCIS stage 0  . CESAREAN SECTION  1985  . LAPAROSCOPIC GASTRIC SLEEVE RESECTION      Family History  Problem Relation Age of Onset  . Hypertension Father   . Heart attack Father   . Alcohol abuse Father   . Diabetes Brother   . Other Brother        complications of exposure to agent orange  . Stroke Brother   . Diverticulosis Daughter   . Diabetes Sister   .  Hypertension Sister     Social History   Socioeconomic History  . Marital status: Divorced    Spouse name: Not on file  . Number of children: Not on file  . Years of education: 39  . Highest education level: Not on file  Occupational History  . Occupation: Chief Strategy Officer  Social Needs  . Financial resource strain: Not on file  . Food insecurity:    Worry: Not on file    Inability: Not on file  . Transportation needs:    Medical: Not on file    Non-medical: Not on file  Tobacco Use  . Smoking status: Never Smoker  . Smokeless tobacco: Never Used  Substance and Sexual Activity  . Alcohol use: No    Alcohol/week: 0.0 oz  . Drug use: No  . 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 Physical activity:    Days per week: Not on file    Minutes per session: Not on file  . Stress: Not on file  Relationships  . Social connections:    Talks on phone: Not on file    Gets together: Not on file    Attends religious service: Not on file    Active member of club or organization: Not on file    Attends meetings of clubs or organizations: Not on file    Relationship status: Not on file  . Intimate partner violence:    Fear of current or ex partner: Not on file    Emotionally abused: Not on file    Physically abused: Not on file    Forced sexual activity: Not on file  Other Topics Concern  . Not on file  Social History Narrative  . Not on file   Review of Systems No SOB No recent travel    Objective:   Physical Exam  Constitutional: She appears well-developed. No distress.  HENT:  Mouth/Throat: Oropharynx is clear and moist. No oropharyngeal exudate.  No sinus tenderness Right TM retracted but no inflammation Left TM normal Mild nasal congestion  Neck: No thyromegaly present.  Respiratory: Effort normal and breath sounds normal. No respiratory distress. She has no wheezes. She has no rales.  Lymphadenopathy:    She has no cervical adenopathy.           Assessment & Plan:

## 2018-02-27 ENCOUNTER — Other Ambulatory Visit: Payer: Self-pay | Admitting: Family Medicine

## 2018-02-27 MED FILL — LOSARTAN POTASSIUM 100 MG T: 100 | 90 days supply | Qty: 90 | Fill #0

## 2018-03-13 ENCOUNTER — Encounter: Payer: Self-pay | Admitting: Family Medicine

## 2018-03-13 ENCOUNTER — Other Ambulatory Visit: Payer: Self-pay | Admitting: Family Medicine

## 2018-03-14 ENCOUNTER — Other Ambulatory Visit: Payer: Self-pay

## 2018-03-14 MED ORDER — SYNTHROID 112 MCG PO TABS: 112.0000 ug | ORAL_TABLET | Freq: Every day | ORAL | 2 refills | Status: DC

## 2018-03-14 MED FILL — SYNTHROID 112 MCG TABLET: 112 | 30 days supply | Qty: 30 | Fill #0

## 2018-03-14 NOTE — Telephone Encounter (Signed)
I have sent medication

## 2018-03-14 NOTE — Telephone Encounter (Signed)
Pt came in office stating that did not read the message correct and came to pick up her rx, at the pharmacy they informed her that she needed to let us know what pharmacy to have it sent to, pt would like [place] to be sent her rx. Please advise ASAP since pt is here today.

## 2018-03-26 ENCOUNTER — Encounter: Payer: Self-pay | Admitting: Family Medicine

## 2018-03-26 MED ORDER — SYNTHROID 112 MCG PO TABS: 112.0000 ug | ORAL_TABLET | Freq: Every day | ORAL | 1 refills | Status: DC

## 2018-03-27 ENCOUNTER — Encounter: Payer: Self-pay | Admitting: Family Medicine

## 2018-03-27 NOTE — Telephone Encounter (Signed)
Made appt for 03/28/18 @ 9am

## 2018-03-28 ENCOUNTER — Ambulatory Visit: Payer: Medicare Other | Admitting: Family Medicine

## 2018-04-04 ENCOUNTER — Ambulatory Visit (INDEPENDENT_AMBULATORY_CARE_PROVIDER_SITE_OTHER): Payer: Medicare Other | Admitting: Family Medicine

## 2018-04-04 ENCOUNTER — Encounter: Payer: Self-pay | Admitting: Family Medicine

## 2018-04-04 DIAGNOSIS — E559 Vitamin D deficiency, unspecified: Secondary | ICD-10-CM

## 2018-04-04 DIAGNOSIS — E782 Mixed hyperlipidemia: Secondary | ICD-10-CM

## 2018-04-04 DIAGNOSIS — N289 Disorder of kidney and ureter, unspecified: Secondary | ICD-10-CM

## 2018-04-04 DIAGNOSIS — T7840XD Allergy, unspecified, subsequent encounter: Secondary | ICD-10-CM

## 2018-04-04 DIAGNOSIS — Z862 Personal history of diseases of the blood and blood-forming organs and certain disorders involving the immune mechanism: Secondary | ICD-10-CM | POA: Diagnosis not present

## 2018-04-04 DIAGNOSIS — H6981 Other specified disorders of Eustachian tube, right ear: Secondary | ICD-10-CM | POA: Diagnosis not present

## 2018-04-04 DIAGNOSIS — E039 Hypothyroidism, unspecified: Secondary | ICD-10-CM | POA: Diagnosis not present

## 2018-04-04 LAB — COMPREHENSIVE METABOLIC PANEL
ALBUMIN: 3.8 g/dL (ref 3.5–5.2)
ALT: 16 U/L (ref 0–35)
AST: 20 U/L (ref 0–37)
Alkaline Phosphatase: 72 U/L (ref 39–117)
BUN: 41 mg/dL — AB (ref 6–23)
CHLORIDE: 104 meq/L (ref 96–112)
CO2: 30 mEq/L (ref 19–32)
Calcium: 9.7 mg/dL (ref 8.4–10.5)
Creatinine, Ser: 2.16 mg/dL — ABNORMAL HIGH (ref 0.40–1.20)
GFR: 29.33 mL/min — AB (ref >60.00)
GLUCOSE: 86 mg/dL (ref 70–99)
POTASSIUM: 4.1 meq/L (ref 3.5–5.1)
SODIUM: 140 meq/L (ref 135–145)
Total Bilirubin: 0.7 mg/dL (ref 0.2–1.2)
Total Protein: 7.4 g/dL (ref 6.0–8.3)

## 2018-04-04 LAB — LIPID PANEL
CHOLESTEROL: 223 mg/dL — AB (ref 0–200)
HDL: 104.7 mg/dL (ref >39.00)
LDL CALC: 110 mg/dL — AB (ref 0–99)
NonHDL: 118.01
Total CHOL/HDL Ratio: 2
Triglycerides: 38 mg/dL (ref 0.0–149.0)
VLDL: 7.6 mg/dL (ref 0.0–40.0)

## 2018-04-04 LAB — CBC
HEMATOCRIT: 33.9 % — AB (ref 36.0–46.0)
Hemoglobin: 11.2 g/dL — ABNORMAL LOW (ref 12.0–15.0)
MCHC: 33 g/dL (ref 30.0–36.0)
MCV: 84.4 fl (ref 78.0–100.0)
Platelets: 189 10*3/uL (ref 150.0–400.0)
RBC: 4.02 Mil/uL (ref 3.87–5.11)
RDW: 13.7 % (ref 11.5–15.5)
WBC: 4.9 10*3/uL (ref 4.0–10.5)

## 2018-04-04 LAB — TSH: TSH: 0.42 u[IU]/mL (ref 0.35–4.50)

## 2018-04-04 LAB — VITAMIN D 25 HYDROXY (VIT D DEFICIENCY, FRACTURES): VITD: 43.53 ng/mL (ref 30.00–100.00)

## 2018-04-04 NOTE — Assessment & Plan Note (Signed)
Has improved some with a zpak and prednisone but then it returned will continue to Fluticasone and add nasal saline and then a valsalva maneuver then if no improvement then call for referral to ENt

## 2018-04-04 NOTE — Patient Instructions (Signed)
Add nasal saline and valsalva maneuver  Eustachian Tube Dysfunction The eustachian tube connects the middle ear to the back of the nose. It regulates air pressure in the middle ear by allowing air to move between the ear and nose. It also helps to drain fluid from the middle ear space. When the eustachian tube does not function properly, air pressure, fluid, or both can build up in the middle ear. Eustachian tube dysfunction can affect one or both ears. What are the causes? This condition happens when the eustachian tube becomes blocked or cannot open normally. This may result from:  Ear infections.  Colds and other upper respiratory infections.  Allergies.  Irritation, such as from cigarette smoke or acid from the stomach coming up into the esophagus (gastroesophageal reflux).  Sudden changes in air pressure, such as from descending in an airplane.  Abnormal growths in the nose or throat, such as nasal polyps, tumors, or enlarged tissue at the back of the throat (adenoids).  What increases the risk? This condition may be more likely to develop in people who smoke and people who are overweight. Eustachian tube dysfunction may also be more likely to develop in children, especially children who have:  Certain birth defects of the mouth, such as cleft palate.  Large tonsils and adenoids.  What are the signs or symptoms? Symptoms of this condition may include:  A feeling of fullness in the ear.  Ear pain.  Clicking or popping noises in the ear.  Ringing in the ear.  Hearing loss.  Loss of balance.  Symptoms may get worse when the air pressure around you changes, such as when you travel to an area of high elevation or fly on an airplane. How is this diagnosed? This condition may be diagnosed based on:  Your symptoms.  A physical exam of your ear, nose, and throat.  Tests, such as those that measure: ? The movement of your eardrum (tympanogram). ? Your hearing  (audiometry).  How is this treated? Treatment depends on the cause and severity of your condition. If your symptoms are mild, you may be able to relieve your symptoms by moving air into ("popping") your ears. If you have symptoms of fluid in your ears, treatment may include:  Decongestants.  Antihistamines.  Nasal sprays or ear drops that contain medicines that reduce swelling (steroids).  In some cases, you may need to have a procedure to drain the fluid in your eardrum (myringotomy). In this procedure, a small tube is placed in the eardrum to:  Drain the fluid.  Restore the air in the middle ear space.  Follow these instructions at home:  Take over-the-counter and prescription medicines only as told by your health care provider.  Use techniques to help pop your ears as recommended by your health care provider. These may include: ? Chewing gum. ? Yawning. ? Frequent, forceful swallowing. ? Closing your mouth, holding your nose closed, and gently blowing as if you are trying to blow air out of your nose.  Do not do any of the following until your health care provider approves: ? Travel to high altitudes. ? Fly in airplanes. ? Work in a Pension scheme manager or room. ? Scuba dive.  Keep your ears dry. Dry your ears completely after showering or bathing.  Do not smoke.  Keep all follow-up visits as told by your health care provider. This is important. Contact a health care provider if:  Your symptoms do not go away after treatment.  Your symptoms  come back after treatment.  You are unable to pop your ears.  You have: ? A fever. ? Pain in your ear. ? Pain in your head or neck. ? Fluid draining from your ear.  Your hearing suddenly changes.  You become very dizzy.  You lose your balance. This information is not intended to replace advice given to you by your health care provider. Make sure you discuss any questions you have with your health care provider. Document  Released: 09/10/2015 Document Revised: 01/20/2016 Document Reviewed: 09/02/2014 Elsevier Interactive Patient Education  [employer].

## 2018-04-04 NOTE — Progress Notes (Signed)
Subjective:  I acted as a Education administrator for Dr. Charlett Blake. [name], [place]  Patient ID: Stephanie Pierce, female    DOB: 1951/08/01, 67 y.o.   MRN: [id number]  No chief complaint on file.   HPI  Patient is in today for an acute visit for her right ear being "clogged". She states she has had it cleaned out   before and has recently taken a course of steroids and azithromycin but the sense of her ear being full persists. No discharge, pain or itching. No headache or fevers. Does note some nasal congestion. Denies CP/palp/SOB/HA/fevers/GI or GU c/o. Taking meds as prescribed  Patient Care Team: Mosie Lukes, MD as PCP - General (Family Medicine)   Past Medical History:  Diagnosis Date  . Allergic state 03/23/2015  . Allergy   . Breast fibrocystic disorder   . Cancer ([place])   . DCIS (ductal carcinoma in situ) of breast   . 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 Enlarged thyroid gland   . H/O iron deficiency anemia 03/23/2015  . History of chicken pox 03/23/2015  . Hyperlipidemia, mixed 03/23/2015  . Hypothyroidism 03/23/2015  . Overweight 03/23/2015  . Personal history of radiation therapy   . Pneumonia 2012  . Renal insufficiency 03/23/2015  . S/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  . Vitamin D deficiency 05/12/2015    Past Surgical History:  Procedure Laterality Date  . BREAST LUMPECTOMY     h/o 2 needle biopsy, in 2015 right Lumpectomy DCIS stage 0  . CESAREAN SECTION  1985  . LAPAROSCOPIC GASTRIC SLEEVE RESECTION      Family History  Problem Relation Age of Onset  . Hypertension Father   . Heart attack Father   . Alcohol abuse Father   . Diabetes Brother   . Other Brother        complications of exposure to agent orange  . Stroke Brother   . Diverticulosis Daughter   . Diabetes Sister   . Hypertension Sister     Social History   Socioeconomic History  . Marital status: Divorced    Spouse name: Not on file  . Number of children: Not on  file  . Years of education: 53  . Highest education level: Not on file  Occupational History  . Occupation: Chief Strategy Officer  Social Needs  . Financial resource strain: Not on file  . Food insecurity:    Worry: Not on file    Inability: Not on file  . Transportation needs:    Medical: Not on file    Non-medical: Not on file  Tobacco Use  . Smoking status: Never Smoker  . Smokeless tobacco: Never Used  Substance and Sexual Activity  . Alcohol use: No    Alcohol/week: 0.0 standard drinks  . Drug use: No  . 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 Physical activity:    Days per week: Not on file    Minutes per session: Not on file  . Stress: Not on file  Relationships  . Social connections:    Talks on phone: Not on file    Gets together: Not on file    Attends religious service: Not on file    Active member of club or organization: Not on file    Attends meetings of  clubs or organizations: Not on file    Relationship status: Not on file  . Intimate partner violence:    Fear of current or ex partner: Not on file    Emotionally abused: Not on file    Physically abused: Not on file    Forced sexual activity: Not on file  Other Topics Concern  . Not on file  Social History Narrative  . Not on file    Outpatient Medications Prior to Visit  Medication Sig Dispense Refill  . calcium citrate-vitamin D (CALCIUM CITRATE CHEWY BITE) 500-500 MG-UNIT chewable tablet Chew 1 tablet by mouth daily. 30 tablet 3  . Cholecalciferol (VITAMIN D3) 10000 units capsule Take 1 capsule (10,000 Units total) by mouth every other day. 12 capsule 5  . Cyanocobalamin (B-12) 2500 MCG TABS Take 1 tablet by mouth once a week. 12 tablet 5  . Ferrous Fumarate-Folic Acid (HEMOCYTE-F) 324-1 MG TABS Take 1 tablet by mouth daily. 90 each 1  . fluticasone (FLONASE) 50 MCG/ACT nasal  spray Place 2 sprays into both nostrils daily. 16 g 2  . KRILL OIL OMEGA-3 PO Take by mouth. Take 2 daily    . levocetirizine (XYZAL) 5 MG tablet Take 1 tablet (5 mg total) by mouth every evening. 30 tablet 3  . losartan (COZAAR) 100 MG tablet TAKE 1 TABLET DAILY 60 tablet 0  . losartan (COZAAR) 100 MG tablet TAKE 1 TABLET (100 MG TOTAL) BY MOUTH DAILY. 90 tablet 2  . montelukast (SINGULAIR) 10 MG tablet Take 1 tablet (10 mg total) by mouth at bedtime. 30 tablet 3  . Multiple Vitamins-Minerals (CENTRUM SILVER ADULT 50+ PO) Take 1 tablet by mouth every other day.    . predniSONE (DELTASONE) 20 MG tablet Take 2 tablets (40 mg total) by mouth daily. 6 tablet 0  . SYNTHROID 112 MCG tablet Take 1 tablet (112 mcg total) by mouth daily before breakfast. 90 tablet 1   No facility-administered medications prior to visit.     Allergies  Allergen Reactions  . Iodine Hives  . Lisinopril Swelling    Swelling in the throat  . Penicillins Rash    Review of Systems  Constitutional: Negative for fever and malaise/fatigue.  HENT: Positive for congestion, ear discharge and hearing loss. Negative for ear pain.   Eyes: Negative for blurred vision.  Respiratory: Negative for shortness of breath.   Cardiovascular: Negative for chest pain, palpitations and leg swelling.  Gastrointestinal: Negative for abdominal pain, blood in stool and nausea.  Genitourinary: Negative for dysuria and frequency.  Musculoskeletal: Negative for falls.  Skin: Negative for rash.  Neurological: Negative for dizziness, loss of consciousness and headaches.  Endo/Heme/Allergies: Negative for environmental allergies.  Psychiatric/Behavioral: Negative for depression. The patient is not nervous/anxious.        Objective:    Physical Exam  Constitutional: She is oriented to person, place, and time. She appears well-developed and well-nourished. No distress.  HENT:  Head: Normocephalic and atraumatic.  Nose: Nose normal.  TMs  dull but clear.   Eyes: Right eye exhibits no discharge. Left eye exhibits no discharge.  Neck: Normal range of motion. Neck supple.  Cardiovascular: Normal rate and regular rhythm.  No murmur heard. Pulmonary/Chest: Effort normal and breath sounds normal.  Abdominal: Soft. Bowel sounds are normal. There is no tenderness.  Musculoskeletal: She exhibits no edema.  Neurological: She is alert and oriented to person, place, and time.  Skin: Skin is warm and dry.  Psychiatric: She has a normal  mood and affect.  Nursing note and vitals reviewed.   BP 128/82 (BP Location: Left Arm, Patient Position: Sitting, Cuff Size: Normal)   Pulse 73   Temp 97.7 F (36.5 C) (Oral)   Resp 18   Ht 5' 7" (1.702 m)   Wt 211 lb (95.7 kg)   SpO2 98%   BMI 33.05 kg/m  Wt Readings from Last 3 Encounters:  04/04/18 211 lb (95.7 kg)  02/23/18 209 lb (94.8 kg)  02/15/18 209 lb 9.6 oz (95.1 kg)   BP Readings from Last 3 Encounters:  04/04/18 128/82  02/23/18 130/88  02/15/18 123/83     Immunization History  Administered Date(s) Administered  . Influenza,inj,Quad PF,6+ Mos 06/08/2016  . Influenza-Unspecified 05/28/2014, 05/29/2015, 06/08/2016  . Pneumococcal Conjugate-13 02/03/2016  . Pneumococcal Polysaccharide-23 10/05/2017  . Pneumococcal-Unspecified 08/28/2010  . Tdap 02/03/2016  . Zoster 03/23/2015    Health Maintenance  Topic Date Due  . INFLUENZA VACCINE  03/28/2018  . MAMMOGRAM  11/10/2019  . COLONOSCOPY  05/30/2025  . TETANUS/TDAP  02/02/2026  . DEXA SCAN  Completed  . Hepatitis C Screening  Completed  . PNA vac Low Risk Adult  Completed    Lab Results  Component Value Date   WBC 4.9 04/04/2018   HGB 11.2 (L) 04/04/2018   HCT 33.9 (L) 04/04/2018   PLT 189.0 04/04/2018   GLUCOSE 86 04/04/2018   CHOL 223 (H) 04/04/2018   TRIG 38.0 04/04/2018   HDL 104.70 04/04/2018   LDLCALC 110 (H) 04/04/2018   ALT 16 04/04/2018   AST 20 04/04/2018   NA 140 04/04/2018   K 4.1 04/04/2018     CL 104 04/04/2018   CREATININE 2.16 (H) 04/04/2018   BUN 41 (H) 04/04/2018   CO2 30 04/04/2018   TSH 0.42 04/04/2018    Lab Results  Component Value Date   TSH 0.42 04/04/2018   Lab Results  Component Value Date   WBC 4.9 04/04/2018   HGB 11.2 (L) 04/04/2018   HCT 33.9 (L) 04/04/2018   MCV 84.4 04/04/2018   PLT 189.0 04/04/2018   Lab Results  Component Value Date   NA 140 04/04/2018   K 4.1 04/04/2018   CHLORIDE 105 12/07/2016   CO2 30 04/04/2018   GLUCOSE 86 04/04/2018   BUN 41 (H) 04/04/2018   CREATININE 2.16 (H) 04/04/2018   BILITOT 0.7 04/04/2018   ALKPHOS 72 04/04/2018   AST 20 04/04/2018   ALT 16 04/04/2018   PROT 7.4 04/04/2018   ALBUMIN 3.8 04/04/2018   CALCIUM 9.7 04/04/2018   ANIONGAP 8 12/07/2016   EGFR 26 (L) 12/07/2016   GFR 29.33 (L) 04/04/2018   Lab Results  Component Value Date   CHOL 223 (H) 04/04/2018   Lab Results  Component Value Date   HDL 104.70 04/04/2018   Lab Results  Component Value Date   LDLCALC 110 (H) 04/04/2018   Lab Results  Component Value Date   TRIG 38.0 04/04/2018   Lab Results  Component Value Date   CHOLHDL 2 04/04/2018   No results found for: HGBA1C       Assessment & Plan:   Problem List Items Addressed This Visit    Vitamin D deficiency (Chronic)    Supplement and monitor. Takes Vitamin D3 1000 IU daily      Relevant Orders   VITAMIN D 25 Hydroxy (Vit-D Deficiency, Fractures) (Completed)   Hyperlipidemia, mixed    Encouraged heart healthy diet, increase exercise, avoid trans fats, takes a  krill oil cap daily      Relevant Orders   Lipid panel (Completed)   Renal insufficiency    Check cmp, still elevated Creatinine. She is taking a protein supplement and agrees to stop it and recheck cmp      Relevant Orders   CBC (Completed)   Comprehensive metabolic panel (Completed)   Allergy    Encouraged daily antihistamines and nasal saline      Hypothyroidism    On Levothyroxine, continue to  monitor      Relevant Orders   TSH (Completed)   H/O iron deficiency anemia    Increase leafy greens, consider increased lean red meat and using cast iron cookware. Continue to monitor, report any concerns      Eustachian tube dysfunction    Has improved some with a zpak and prednisone but then it returned will continue to Fluticasone and add nasal saline and then a valsalva maneuver then if no improvement then call for referral to ENt      Relevant Orders   CBC (Completed)      I am having Stephanie Pierce maintain her Multiple Vitamins-Minerals (CENTRUM SILVER ADULT 50+ PO), KRILL OIL OMEGA-3 PO, losartan, calcium citrate-vitamin D, B-12, Vitamin D3, Ferrous Fumarate-Folic Acid, levocetirizine, montelukast, fluticasone, predniSONE, losartan, and SYNTHROID.  No orders of the defined types were placed in this encounter.   CMA served as Education administrator during this visit. History, Physical and Plan performed by medical provider. Documentation and orders reviewed and attested to.  Penni Homans, MD

## 2018-04-04 NOTE — Assessment & Plan Note (Signed)
Supplement and monitor. Takes Vitamin D3 1000 IU daily

## 2018-04-04 NOTE — Assessment & Plan Note (Addendum)
Check cmp, still elevated Creatinine. She is taking a protein supplement and agrees to stop it and recheck cmp

## 2018-04-04 NOTE — Assessment & Plan Note (Signed)
On Levothyroxine, continue to monitor 

## 2018-04-04 NOTE — Assessment & Plan Note (Addendum)
Encouraged heart healthy diet, increase exercise, avoid trans fats, takes a krill oil cap daily

## 2018-04-07 NOTE — Assessment & Plan Note (Signed)
Increase leafy greens, consider increased lean red meat and using cast iron cookware. Continue to monitor, report any concerns 

## 2018-04-07 NOTE — Assessment & Plan Note (Signed)
Encouraged daily antihistamines and nasal saline

## 2018-04-09 ENCOUNTER — Other Ambulatory Visit: Payer: Self-pay | Admitting: Family Medicine

## 2018-04-09 ENCOUNTER — Encounter: Payer: Self-pay | Admitting: Family Medicine

## 2018-04-09 DIAGNOSIS — H6593 Unspecified nonsuppurative otitis media, bilateral: Secondary | ICD-10-CM

## 2018-04-09 MED FILL — LEVOCETIRIZINE 5 MG TABLET: 5 | 30 days supply | Qty: 30 | Fill #1

## 2018-04-10 MED FILL — FLUTICASONE PROP 50 MCG SPR: 50 | 30 days supply | Qty: 16 | Fill #1

## 2018-04-11 MED FILL — MONTELUKAST SOD 10 MG TAB: 10 | 30 days supply | Qty: 30 | Fill #1

## 2018-04-12 ENCOUNTER — Encounter: Payer: Self-pay | Admitting: Family Medicine

## 2018-04-24 DIAGNOSIS — H9313 Tinnitus, bilateral: Secondary | ICD-10-CM | POA: Diagnosis not present

## 2018-04-24 DIAGNOSIS — H900 Conductive hearing loss, bilateral: Secondary | ICD-10-CM | POA: Diagnosis not present

## 2018-04-24 DIAGNOSIS — H6983 Other specified disorders of Eustachian tube, bilateral: Secondary | ICD-10-CM | POA: Diagnosis not present

## 2018-05-04 DIAGNOSIS — Z23 Encounter for immunization: Secondary | ICD-10-CM | POA: Diagnosis not present

## 2018-05-09 MED FILL — FLUTICASONE PROP 50 MCG SPR: 50 | 30 days supply | Qty: 16 | Fill #2

## 2018-05-30 MED FILL — LOSARTAN POTASSIUM 100 MG T: 100 | 90 days supply | Qty: 90 | Fill #1

## 2018-06-18 ENCOUNTER — Encounter: Payer: Self-pay | Admitting: Family Medicine

## 2018-07-04 MED ORDER — CA PHOSPHATE-CHOLECALCIFEROL 250-500 MG-UNIT PO CHEW: 1.0000 | CHEWABLE_TABLET | Freq: Every day | ORAL | 5 refills | Status: DC

## 2018-07-05 ENCOUNTER — Telehealth: Payer: Self-pay | Admitting: Family Medicine

## 2018-07-05 NOTE — Telephone Encounter (Signed)
Copied from [place] [phone]. Topic: Quick Communication - See Telephone Encounter >> Jul 05, 2018 11:15 AM Conception Chancy, NT wrote: CRM for notification. See Telephone encounter for: 07/05/18.  [facility] pharmacy is calling in regards to the medication Ca Phosphate-Cholecalciferol (CALCIUM 500 + D3) 250-500 MG-UNIT CHEW  and states they do not have 500 but they do have 650 and would like to know if this can be changed.   Cb# [phone]

## 2018-07-07 NOTE — Telephone Encounter (Signed)
Yes OK to change from the 500 to the 650 please let pharmacy know

## 2018-07-08 NOTE — Telephone Encounter (Signed)
Notified Aleene Davidson in the pharmacy.

## 2018-07-31 ENCOUNTER — Other Ambulatory Visit: Payer: Self-pay | Admitting: Medical

## 2018-08-01 MED FILL — FLUTICASONE PROP 50 MCG SPR: 50 | 30 days supply | Qty: 16 | Fill #0

## 2018-09-03 MED FILL — LOSARTAN POTASSIUM 100 MG T: 100 | 90 days supply | Qty: 90 | Fill #2

## 2018-09-04 MED FILL — FLUTICASONE PROP 50 MCG SPR: 50 | 30 days supply | Qty: 16 | Fill #1

## 2018-09-20 ENCOUNTER — Other Ambulatory Visit: Payer: Self-pay | Admitting: Family Medicine

## 2018-09-20 ENCOUNTER — Other Ambulatory Visit: Payer: Self-pay | Admitting: Hematology & Oncology

## 2018-09-20 DIAGNOSIS — Z853 Personal history of malignant neoplasm of breast: Secondary | ICD-10-CM

## 2018-09-23 ENCOUNTER — Encounter: Payer: Self-pay | Admitting: Family Medicine

## 2018-09-23 ENCOUNTER — Other Ambulatory Visit: Payer: Self-pay | Admitting: Family Medicine

## 2018-09-23 MED ORDER — SYNTHROID 112 MCG PO TABS: 112.0000 ug | ORAL_TABLET | Freq: Every day | ORAL | 0 refills | Status: DC

## 2018-09-23 MED FILL — SYNTHROID 112 MCG TABLET: 112 | 30 days supply | Qty: 30 | Fill #0

## 2018-09-25 ENCOUNTER — Telehealth: Payer: Self-pay | Admitting: *Deleted

## 2018-09-25 NOTE — Telephone Encounter (Signed)
Received Physician Orders from [facility]; forwarded to provider/SLS 01/29

## 2018-10-24 ENCOUNTER — Encounter: Payer: Self-pay | Admitting: Family Medicine

## 2018-10-25 ENCOUNTER — Other Ambulatory Visit: Payer: Self-pay | Admitting: Family Medicine

## 2018-10-25 DIAGNOSIS — E669 Obesity, unspecified: Secondary | ICD-10-CM

## 2018-11-08 ENCOUNTER — Other Ambulatory Visit: Payer: Self-pay

## 2018-11-08 ENCOUNTER — Ambulatory Visit (INDEPENDENT_AMBULATORY_CARE_PROVIDER_SITE_OTHER): Payer: Medicare Other | Admitting: Family Medicine

## 2018-11-08 DIAGNOSIS — N289 Disorder of kidney and ureter, unspecified: Secondary | ICD-10-CM

## 2018-11-08 DIAGNOSIS — E559 Vitamin D deficiency, unspecified: Secondary | ICD-10-CM

## 2018-11-08 DIAGNOSIS — D0511 Intraductal carcinoma in situ of right breast: Secondary | ICD-10-CM | POA: Diagnosis not present

## 2018-11-08 DIAGNOSIS — Z862 Personal history of diseases of the blood and blood-forming organs and certain disorders involving the immune mechanism: Secondary | ICD-10-CM | POA: Diagnosis not present

## 2018-11-08 DIAGNOSIS — E669 Obesity, unspecified: Secondary | ICD-10-CM

## 2018-11-08 DIAGNOSIS — Z9889 Other specified postprocedural states: Secondary | ICD-10-CM | POA: Diagnosis not present

## 2018-11-08 DIAGNOSIS — E538 Deficiency of other specified B group vitamins: Secondary | ICD-10-CM

## 2018-11-08 DIAGNOSIS — E782 Mixed hyperlipidemia: Secondary | ICD-10-CM | POA: Diagnosis not present

## 2018-11-08 DIAGNOSIS — T7840XD Allergy, unspecified, subsequent encounter: Secondary | ICD-10-CM

## 2018-11-08 DIAGNOSIS — Z1211 Encounter for screening for malignant neoplasm of colon: Secondary | ICD-10-CM | POA: Diagnosis not present

## 2018-11-08 DIAGNOSIS — E039 Hypothyroidism, unspecified: Secondary | ICD-10-CM

## 2018-11-08 LAB — COMPREHENSIVE METABOLIC PANEL
ALT: 18 U/L (ref 0–35)
AST: 22 U/L (ref 0–37)
Albumin: 3.9 g/dL (ref 3.5–5.2)
Alkaline Phosphatase: 69 U/L (ref 39–117)
BUN: 40 mg/dL — ABNORMAL HIGH (ref 6–23)
CO2: 29 mEq/L (ref 19–32)
Calcium: 9.5 mg/dL (ref 8.4–10.5)
Chloride: 103 mEq/L (ref 96–112)
Creatinine, Ser: 1.96 mg/dL — ABNORMAL HIGH (ref 0.40–1.20)
GFR: 30.81 mL/min — AB (ref >60.00)
Glucose, Bld: 82 mg/dL (ref 70–99)
Potassium: 4 mEq/L (ref 3.5–5.1)
Sodium: 139 mEq/L (ref 135–145)
Total Bilirubin: 0.5 mg/dL (ref 0.2–1.2)
Total Protein: 7.4 g/dL (ref 6.0–8.3)

## 2018-11-08 LAB — CBC
HCT: 34.5 % — ABNORMAL LOW (ref 36.0–46.0)
HEMOGLOBIN: 11.3 g/dL — AB (ref 12.0–15.0)
MCHC: 32.8 g/dL (ref 30.0–36.0)
MCV: 84.9 fl (ref 78.0–100.0)
Platelets: 180 10*3/uL (ref 150.0–400.0)
RBC: 4.06 Mil/uL (ref 3.87–5.11)
RDW: 14 % (ref 11.5–15.5)
WBC: 4.5 10*3/uL (ref 4.0–10.5)

## 2018-11-08 LAB — LIPID PANEL
Cholesterol: 229 mg/dL — ABNORMAL HIGH (ref 0–200)
HDL: 109.1 mg/dL (ref >39.00)
LDL Cholesterol: 113 mg/dL — ABNORMAL HIGH (ref 0–99)
NonHDL: 120.33
Total CHOL/HDL Ratio: 2
Triglycerides: 39 mg/dL (ref 0.0–149.0)
VLDL: 7.8 mg/dL (ref 0.0–40.0)

## 2018-11-08 LAB — MAGNESIUM: Magnesium: 2.1 mg/dL (ref 1.5–2.5)

## 2018-11-08 LAB — VITAMIN D 25 HYDROXY (VIT D DEFICIENCY, FRACTURES): VITD: 49.07 ng/mL (ref 30.00–100.00)

## 2018-11-08 LAB — TSH: TSH: 0.11 u[IU]/mL — ABNORMAL LOW (ref 0.35–4.50)

## 2018-11-08 LAB — VITAMIN B12: Vitamin B-12: 828 pg/mL (ref 211–911)

## 2018-11-08 MED ORDER — LOSARTAN POTASSIUM 100 MG PO TABS: 100.0000 mg | ORAL_TABLET | Freq: Every day | ORAL | 1 refills | Status: DC

## 2018-11-08 MED ORDER — SYNTHROID 112 MCG PO TABS: 112.0000 ug | ORAL_TABLET | Freq: Every day | ORAL | 4 refills | Status: DC

## 2018-11-08 MED ORDER — MONTELUKAST SODIUM 10 MG PO TABS: 10.0000 mg | ORAL_TABLET | Freq: Every day | ORAL | 1 refills | Status: DC

## 2018-11-08 MED ORDER — FLUTICASONE PROPIONATE 50 MCG/ACT NA SUSP: 2.0000 | Freq: Every day | NASAL | 2 refills | Status: DC

## 2018-11-08 NOTE — Assessment & Plan Note (Signed)
Supplement and monitor 

## 2018-11-08 NOTE — Assessment & Plan Note (Signed)
Check level 

## 2018-11-08 NOTE — Assessment & Plan Note (Signed)
On Levothyroxine, continue to monitor 

## 2018-11-08 NOTE — Assessment & Plan Note (Signed)
Check cmp and hydrate

## 2018-11-08 NOTE — Assessment & Plan Note (Signed)
Encouraged heart healthy diet, increase exercise, avoid trans fats, consider a krill oil cap daily 

## 2018-11-08 NOTE — Patient Instructions (Signed)
Preventive Care 67 Years and Older, Female Preventive care refers to lifestyle choices and visits with your health care provider that can promote health and wellness. What does preventive care include?  A yearly physical exam. This is also called an annual well check.  Dental exams once or twice a year.  Routine eye exams. Ask your health care provider how often you should have your eyes checked.  Personal lifestyle choices, including: ? Daily care of your teeth and gums. ? Regular physical activity. ? Eating a healthy diet. ? Avoiding tobacco and drug use. ? Limiting alcohol use. ? Practicing safe sex. ? Taking low-dose aspirin every day. ? Taking vitamin and mineral supplements as recommended by your health care provider. What happens during an annual well check? The services and screenings done by your health care provider during your annual well check will depend on your age, overall health, lifestyle risk factors, and family history of disease. Counseling Your health care provider may ask you questions about your:  Alcohol use.  Tobacco use.  Drug use.  Emotional well-being.  Home and relationship well-being.  Sexual activity.  Eating habits.  History of falls.  Memory and ability to understand (cognition).  Work and work Statistician.  Reproductive health.  Screening You may have the following tests or measurements:  Height, weight, and BMI.  Blood pressure.  Lipid and cholesterol levels. These may be checked every 5 years, or more frequently if you are over 30 years old.  Skin check.  Lung cancer screening. You may have this screening every year starting at age 27 if you have a 30-pack-year history of smoking and currently smoke or have quit within the past 15 years.  Colorectal cancer screening. All adults should have this screening starting at age 33 and continuing until age 46. You will have tests every 1-10 years, depending on your results and the  type of screening test. People at increased risk should start screening at an earlier age. Screening tests may include: ? Guaiac-based fecal occult blood testing. ? Fecal immunochemical test (FIT). ? Stool DNA test. ? Virtual colonoscopy. ? Sigmoidoscopy. During this test, a flexible tube with a tiny camera (sigmoidoscope) is used to examine your rectum and lower colon. The sigmoidoscope is inserted through your anus into your rectum and lower colon. ? Colonoscopy. During this test, a long, thin, flexible tube with a tiny camera (colonoscope) is used to examine your entire colon and rectum.  Hepatitis C blood test.  Hepatitis B blood test.  Sexually transmitted disease (STD) testing.  Diabetes screening. This is done by checking your blood sugar (glucose) after you have not eaten for a while (fasting). You may have this done every 1-3 years.  Bone density scan. This is done to screen for osteoporosis. You may have this done starting at age 67.  Mammogram. This may be done every 1-2 years. Talk to your health care provider about how often you should have regular mammograms. Talk with your health care provider about your test results, treatment options, and if necessary, the need for more tests. Vaccines Your health care provider may recommend certain vaccines, such as:  Influenza vaccine. This is recommended every year.  Tetanus, diphtheria, and acellular pertussis (Tdap, Td) vaccine. You may need a Td booster every 10 years.  Varicella vaccine. You may need this if you have not been vaccinated.  Zoster vaccine. You may need this after age 67.  Measles, mumps, and rubella (MMR) vaccine. You may need at least  one dose of MMR if you were born in 1957 or later. You may also need a second dose.  Pneumococcal 13-valent conjugate (PCV13) vaccine. One dose is recommended after age 24.  Pneumococcal polysaccharide (PPSV23) vaccine. One dose is recommended after age 24.  Meningococcal  vaccine. You may need this if you have certain conditions.  Hepatitis A vaccine. You may need this if you have certain conditions or if you travel or work in places where you may be exposed to hepatitis A.  Hepatitis B vaccine. You may need this if you have certain conditions or if you travel or work in places where you may be exposed to hepatitis B.  Haemophilus influenzae type b (Hib) vaccine. You may need this if you have certain conditions. Talk to your health care provider about which screenings and vaccines you need and how often you need them. This information is not intended to replace advice given to you by your health care provider. Make sure you discuss any questions you have with your health care provider. Document Released: 09/10/2015 Document Revised: 10/04/2017 Document Reviewed: 06/15/2015 Elsevier Interactive Patient Education  2019 [employer].

## 2018-11-08 NOTE — Assessment & Plan Note (Signed)
Check cbc and monitor

## 2018-11-08 NOTE — Assessment & Plan Note (Signed)
Encouraged DASH diet, decrease po intake and increase exercise as tolerated. Needs 7-8 hours of sleep nightly. Avoid trans fats, eat small, frequent meals every 4-5 hours with lean proteins, complex carbs and healthy fats. Minimize simple carbs 

## 2018-11-08 NOTE — Assessment & Plan Note (Signed)
Check labs today.

## 2018-11-10 LAB — ZINC: Zinc: 64 ug/dL (ref 60–130)

## 2018-11-10 NOTE — Assessment & Plan Note (Signed)
PND with drainage. Has been seen by ENT and they treated with Prednisone was given and was helpful. Her plugged ears improved temporarily.

## 2018-11-10 NOTE — Assessment & Plan Note (Signed)
Doing well and due for her MGM soon. No concerns

## 2018-11-10 NOTE — Assessment & Plan Note (Signed)
Cologuard negative 3 years ago. Repeat now. Patient declines referral for colonoscopy

## 2018-11-10 NOTE — Progress Notes (Signed)
Subjective:    Patient ID: Stephanie Pierce, female    DOB: [birth year], 68 y.o.   MRN: [id number]  No chief complaint on file.   HPI Patient is in today for annual preventative conversation and follow up examination. No recent febrile illness or hospitalizations. Is doing well with activities of daily living. Is trying to eat well and stay active. No polyuria or polydipsia. Denies CP/palp/SOB/HA/congestion/fevers/GI or GU c/o. Taking meds as prescribed  Past Medical History:  Diagnosis Date  . Allergic state 03/23/2015  . Allergy   . Breast fibrocystic disorder   . Cancer ([place])   . DCIS (ductal carcinoma in situ) of breast   . 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 Enlarged thyroid gland   . H/O iron deficiency anemia 03/23/2015  . History of chicken pox 03/23/2015  . Hyperlipidemia, mixed 03/23/2015  . Hypothyroidism 03/23/2015  . Overweight 03/23/2015  . Personal history of radiation therapy   . Pneumonia 2012  . Renal insufficiency 03/23/2015  . S/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  . Vitamin D deficiency 05/12/2015    Past Surgical History:  Procedure Laterality Date  . BREAST LUMPECTOMY     h/o 2 needle biopsy, in 2015 right Lumpectomy DCIS stage 0  . CESAREAN SECTION  1985  . LAPAROSCOPIC GASTRIC SLEEVE RESECTION      Family History  Problem Relation Age of Onset  . Hypertension Father   . Heart attack Father   . Alcohol abuse Father   . Diabetes Brother   . Other Brother        complications of exposure to agent orange  . Stroke Brother   . Diverticulosis Daughter   . Diabetes Sister   . Hypertension Sister     Social History   Socioeconomic History  . Marital status: Divorced    Spouse name: Not on file  . Number of children: Not on file  . Years of education: 30  . Highest education level: Not on file  Occupational History  . Occupation: Chief Strategy Officer  Social Needs  . Financial resource strain: Not on file   . Food insecurity:    Worry: Not on file    Inability: Not on file  . Transportation needs:    Medical: Not on file    Non-medical: Not on file  Tobacco Use  . Smoking status: Never Smoker  . Smokeless tobacco: Never Used  Substance and Sexual Activity  . Alcohol use: No    Alcohol/week: 0.0 standard drinks  . Drug use: No  . 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 Physical activity:    Days per week: Not on file    Minutes per session: Not on file  . Stress: Not on file  Relationships  . Social connections:    Talks on phone: Not on file    Gets together: Not on file    Attends religious service: Not on file    Active member of club or organization: Not on file    Attends meetings of clubs or organizations: Not on file    Relationship status: Not on file  . Intimate partner violence:    Fear of current or ex partner: Not on file    Emotionally abused: Not on file    Physically abused: Not  on file    Forced sexual activity: Not on file  Other Topics Concern  . Not on file  Social History Narrative  . Not on file    Outpatient Medications Prior to Visit  Medication Sig Dispense Refill  . Cyanocobalamin (B-12) 2500 MCG TABS Take 1 tablet by mouth once a week. 12 tablet 5  . Ferrous Fumarate-Folic Acid (HEMOCYTE-F) 324-1 MG TABS Take 1 tablet by mouth daily. 90 each 1  . KRILL OIL OMEGA-3 PO Take by mouth. Take 2 daily    . levocetirizine (XYZAL) 5 MG tablet Take 1 tablet (5 mg total) by mouth every evening. 30 tablet 3  . Multiple Vitamins-Minerals (CENTRUM SILVER ADULT 50+ PO) Take 1 tablet by mouth every other day.    Marland Kitchen OVER THE COUNTER MEDICATION Take 1 tablet by mouth daily. CALCIUM 650MG / VIT D3    . fluticasone (FLONASE) 50 MCG/ACT nasal spray PLACE 2 SPRAYS INTO BOTH NOSTRILS DAILY. 16 g 2  . losartan (COZAAR) 100 MG tablet TAKE 1 TABLET  DAILY 60 tablet 0  . losartan (COZAAR) 100 MG tablet TAKE 1 TABLET (100 MG TOTAL) BY MOUTH DAILY. 90 tablet 2  . montelukast (SINGULAIR) 10 MG tablet Take 1 tablet (10 mg total) by mouth at bedtime. 30 tablet 3  . SYNTHROID 112 MCG tablet TAKE 1 TABLET DAILY BEFORE BREAKFAST 90 tablet 4  . SYNTHROID 112 MCG tablet Take 1 tablet (112 mcg total) by mouth daily before breakfast. 30 tablet 0  . predniSONE (DELTASONE) 20 MG tablet Take 2 tablets (40 mg total) by mouth daily. 6 tablet 0   No facility-administered medications prior to visit.     Allergies  Allergen Reactions  . Iodine Hives  . Lisinopril Swelling    Swelling in the throat  . Penicillins Rash    ROS     Objective:    Physical Exam Constitutional:      General: She is not in acute distress.    Appearance: She is well-developed.  HENT:     Head: Normocephalic and atraumatic.  Eyes:     Conjunctiva/sclera: Conjunctivae normal.  Neck:     Musculoskeletal: Neck supple.     Thyroid: No thyromegaly.  Cardiovascular:     Rate and Rhythm: Normal rate and regular rhythm.     Heart sounds: Normal heart sounds. No murmur.  Pulmonary:     Effort: Pulmonary effort is normal. No respiratory distress.     Breath sounds: Normal breath sounds.  Abdominal:     General: Bowel sounds are normal. There is no distension.     Palpations: Abdomen is soft. There is no mass.     Tenderness: There is no abdominal tenderness.  Lymphadenopathy:     Cervical: No cervical adenopathy.  Skin:    General: Skin is warm and dry.  Neurological:     Mental Status: She is alert and oriented to person, place, and time.  Psychiatric:        Behavior: Behavior normal.     BP 120/78 (BP Location: Left Arm, Patient Position: Sitting, Cuff Size: Normal)   Pulse 72   Temp 98.2 F (36.8 C) (Oral)   Resp 18   Wt 216 lb 6.4 oz (98.2 kg)   SpO2 97%   BMI 33.89 kg/m  Wt Readings from Last 3 Encounters:  11/08/18 216 lb 6.4 oz (98.2 kg)   04/04/18 211 lb (95.7 kg)  02/23/18 209 lb (94.8 kg)     Lab Results  Component Value Date   WBC 4.5 11/08/2018   HGB 11.3 (L) 11/08/2018   HCT 34.5 (L) 11/08/2018   PLT 180.0 11/08/2018   GLUCOSE 82 11/08/2018   CHOL 229 (H) 11/08/2018   TRIG 39.0 11/08/2018   HDL 109.10 11/08/2018   LDLCALC 113 (H) 11/08/2018   ALT 18 11/08/2018   AST 22 11/08/2018   NA 139 11/08/2018   K 4.0 11/08/2018   CL 103 11/08/2018   CREATININE 1.96 (H) 11/08/2018   BUN 40 (H) 11/08/2018   CO2 29 11/08/2018   TSH 0.11 (L) 11/08/2018    Lab Results  Component Value Date   TSH 0.11 (L) 11/08/2018   Lab Results  Component Value Date   WBC 4.5 11/08/2018   HGB 11.3 (L) 11/08/2018   HCT 34.5 (L) 11/08/2018   MCV 84.9 11/08/2018   PLT 180.0 11/08/2018   Lab Results  Component Value Date   NA 139 11/08/2018   K 4.0 11/08/2018   CHLORIDE 105 12/07/2016   CO2 29 11/08/2018   GLUCOSE 82 11/08/2018   BUN 40 (H) 11/08/2018   CREATININE 1.96 (H) 11/08/2018   BILITOT 0.5 11/08/2018   ALKPHOS 69 11/08/2018   AST 22 11/08/2018   ALT 18 11/08/2018   PROT 7.4 11/08/2018   ALBUMIN 3.9 11/08/2018   CALCIUM 9.5 11/08/2018   ANIONGAP 8 12/07/2016   EGFR 26 (L) 12/07/2016   GFR 30.81 (L) 11/08/2018   Lab Results  Component Value Date   CHOL 229 (H) 11/08/2018   Lab Results  Component Value Date   HDL 109.10 11/08/2018   Lab Results  Component Value Date   LDLCALC 113 (H) 11/08/2018   Lab Results  Component Value Date   TRIG 39.0 11/08/2018   Lab Results  Component Value Date   CHOLHDL 2 11/08/2018   No results found for: HGBA1C     Assessment & Plan:   Problem List Items Addressed This Visit    Vitamin D deficiency (Chronic)    Supplement and monitor      Relevant Orders   Vitamin D (25 hydroxy) (Completed)   Hyperlipidemia, mixed    Encouraged heart healthy diet, increase exercise, avoid trans fats, consider a krill oil cap daily      Relevant Medications   losartan  (COZAAR) 100 MG tablet   Other Relevant Orders   Lipid panel (Completed)   TSH (Completed)   Renal insufficiency    Check cmp and hydrate      Relevant Orders   Comprehensive metabolic panel (Completed)   S/P gastric surgery    Check labs today      Relevant Orders   Zinc   Magnesium (Completed)   Vitamin B12 (Completed)   Vitamin B1   Obesity    Encouraged DASH diet, decrease po intake and increase exercise as tolerated. Needs 7-8 hours of sleep nightly. Avoid trans fats, eat small, frequent meals every 4-5 hours with lean proteins, complex carbs and healthy fats. Minimize simple carbs      Allergy    PND with drainage. Has been seen by ENT and they treated with Prednisone was given and was helpful. Her plugged ears improved temporarily.      DCIS (ductal carcinoma in situ) of breast    Doing well and due for her MGM soon. No concerns      Hypothyroidism    On Levothyroxine, continue to monitor      Relevant Medications   SYNTHROID 112 MCG tablet  Other Relevant Orders   TSH (Completed)   H/O iron deficiency anemia    Check cbc and monitor      Relevant Orders   CBC (Completed)   Colon cancer screening    Cologuard negative 3 years ago. Repeat now. Patient declines referral for colonoscopy      Vitamin B12 deficiency    Check level      Relevant Orders   Vitamin B12 (Completed)      I have discontinued Radiah A. Schnee's predniSONE, losartan, and Synthroid. I have also changed her losartan and Synthroid. Additionally, I am having her maintain her Multiple Vitamins-Minerals (CENTRUM SILVER ADULT 50+ PO), KRILL OIL OMEGA-3 PO, B-12, Ferrous Fumarate-Folic Acid, levocetirizine, OVER THE COUNTER MEDICATION, fluticasone, and montelukast.  Meds ordered this encounter  Medications  . fluticasone (FLONASE) 50 MCG/ACT nasal spray    Sig: Place 2 sprays into both nostrils daily.    Dispense:  16 g    Refill:  2    DR BLYTH IS THE PATIENT'S PRIMARY PHYSICIAN  THANKS!  . losartan (COZAAR) 100 MG tablet    Sig: Take 1 tablet (100 mg total) by mouth daily.    Dispense:  90 tablet    Refill:  1  . SYNTHROID 112 MCG tablet    Sig: Take 1 tablet (112 mcg total) by mouth daily before breakfast.    Dispense:  90 tablet    Refill:  4  . montelukast (SINGULAIR) 10 MG tablet    Sig: Take 1 tablet (10 mg total) by mouth at bedtime.    Dispense:  90 tablet    Refill:  1     Penni Homans, MD

## 2018-11-11 LAB — VITAMIN B1: Vitamin B1 (Thiamine): 15 nmol/L (ref 8–30)

## 2018-11-11 NOTE — Addendum Note (Signed)
Addended byDamita Dunnings D on: 11/11/2018 09:34 AM   Modules accepted: Orders

## 2018-11-20 ENCOUNTER — Other Ambulatory Visit: Payer: Self-pay

## 2018-11-20 ENCOUNTER — Other Ambulatory Visit: Payer: Self-pay | Admitting: Family Medicine

## 2018-11-20 ENCOUNTER — Ambulatory Visit
Admission: RE | Admit: 2018-11-20 | Discharge: 2018-11-20 | Disposition: A | Discharge status: Home / Self Care | Payer: Medicare Other | Source: Ambulatory Visit | Attending: Family Medicine | Admitting: Family Medicine

## 2018-11-20 DIAGNOSIS — Z853 Personal history of malignant neoplasm of breast: Secondary | ICD-10-CM | POA: Diagnosis not present

## 2018-11-20 DIAGNOSIS — R921 Mammographic calcification found on diagnostic imaging of breast: Secondary | ICD-10-CM

## 2018-11-26 ENCOUNTER — Other Ambulatory Visit: Payer: Self-pay

## 2018-11-26 ENCOUNTER — Ambulatory Visit
Admission: RE | Admit: 2018-11-26 | Discharge: 2018-11-26 | Disposition: A | Discharge status: Home / Self Care | Payer: Medicare Other | Source: Ambulatory Visit | Attending: Family Medicine | Admitting: Family Medicine

## 2018-11-26 DIAGNOSIS — N6311 Unspecified lump in the right breast, upper outer quadrant: Secondary | ICD-10-CM | POA: Diagnosis not present

## 2018-11-26 DIAGNOSIS — D0511 Intraductal carcinoma in situ of right breast: Secondary | ICD-10-CM | POA: Diagnosis not present

## 2018-11-26 DIAGNOSIS — R921 Mammographic calcification found on diagnostic imaging of breast: Secondary | ICD-10-CM

## 2018-11-28 ENCOUNTER — Other Ambulatory Visit: Payer: Self-pay

## 2018-11-28 DIAGNOSIS — D0511 Intraductal carcinoma in situ of right breast: Secondary | ICD-10-CM

## 2018-11-29 ENCOUNTER — Inpatient Hospital Stay: Payer: Medicare Other

## 2018-11-29 ENCOUNTER — Other Ambulatory Visit: Payer: Self-pay

## 2018-11-29 ENCOUNTER — Encounter: Payer: Self-pay | Admitting: Hematology & Oncology

## 2018-11-29 ENCOUNTER — Inpatient Hospital Stay: Payer: Medicare Other | Attending: Hematology & Oncology | Admitting: Hematology & Oncology

## 2018-11-29 VITALS — BP 138/88 | HR 60 | Temp 98.0°F | Resp 18 | Wt 213.5 lb

## 2018-11-29 DIAGNOSIS — R918 Other nonspecific abnormal finding of lung field: Secondary | ICD-10-CM

## 2018-11-29 DIAGNOSIS — D0511 Intraductal carcinoma in situ of right breast: Secondary | ICD-10-CM | POA: Diagnosis not present

## 2018-11-29 DIAGNOSIS — Z9889 Other specified postprocedural states: Secondary | ICD-10-CM | POA: Diagnosis not present

## 2018-11-29 DIAGNOSIS — Z86000 Personal history of in-situ neoplasm of breast: Secondary | ICD-10-CM | POA: Insufficient documentation

## 2018-11-29 DIAGNOSIS — Z79899 Other long term (current) drug therapy: Secondary | ICD-10-CM | POA: Diagnosis not present

## 2018-11-29 DIAGNOSIS — Z79811 Long term (current) use of aromatase inhibitors: Secondary | ICD-10-CM | POA: Diagnosis not present

## 2018-11-29 DIAGNOSIS — Z170 Estrogen receptor positive status [ER+]: Secondary | ICD-10-CM | POA: Diagnosis not present

## 2018-11-29 LAB — CMP (CANCER CENTER ONLY)
ALT: 18 U/L (ref 0–44)
AST: 20 U/L (ref 15–41)
Albumin: 3.9 g/dL (ref 3.5–5.0)
Alkaline Phosphatase: 63 U/L (ref 38–126)
Anion gap: 7 (ref 5–15)
BUN: 35 mg/dL — ABNORMAL HIGH (ref 8–23)
CO2: 28 mmol/L (ref 22–32)
Calcium: 9.6 mg/dL (ref 8.9–10.3)
Chloride: 104 mmol/L (ref 98–111)
Creatinine: 2.14 mg/dL — ABNORMAL HIGH (ref 0.44–1.00)
GFR, Est AFR Am: 27 mL/min — ABNORMAL LOW (ref >60)
GFR, Estimated: 23 mL/min — ABNORMAL LOW (ref >60)
Glucose, Bld: 93 mg/dL (ref 70–99)
Potassium: 4.5 mmol/L (ref 3.5–5.1)
Sodium: 139 mmol/L (ref 135–145)
Total Bilirubin: 0.6 mg/dL (ref 0.3–1.2)
Total Protein: 7.8 g/dL (ref 6.5–8.1)

## 2018-11-29 LAB — CBC WITH DIFFERENTIAL (CANCER CENTER ONLY)
Abs Immature Granulocytes: 0.01 10*3/uL (ref 0.00–0.07)
Basophils Absolute: 0.1 10*3/uL (ref 0.0–0.1)
Basophils Relative: 1 %
Eosinophils Absolute: 0.2 10*3/uL (ref 0.0–0.5)
Eosinophils Relative: 5 %
HCT: 34.3 % — ABNORMAL LOW (ref 36.0–46.0)
Hemoglobin: 11.1 g/dL — ABNORMAL LOW (ref 12.0–15.0)
Immature Granulocytes: 0 %
Lymphocytes Relative: 31 %
Lymphs Abs: 1.5 10*3/uL (ref 0.7–4.0)
MCH: 28 pg (ref 26.0–34.0)
MCHC: 32.4 g/dL (ref 30.0–36.0)
MCV: 86.6 fL (ref 80.0–100.0)
Monocytes Absolute: 0.7 10*3/uL (ref 0.1–1.0)
Monocytes Relative: 13 %
Neutro Abs: 2.5 10*3/uL (ref 1.7–7.7)
Neutrophils Relative %: 50 %
Platelet Count: 207 10*3/uL (ref 150–400)
RBC: 3.96 MIL/uL (ref 3.87–5.11)
RDW: 12.7 % (ref 11.5–15.5)
WBC Count: 4.9 10*3/uL (ref 4.0–10.5)
nRBC: 0 % (ref 0.0–0.2)

## 2018-11-29 LAB — LACTATE DEHYDROGENASE: LDH: 185 U/L (ref 98–192)

## 2018-11-29 MED ORDER — LETROZOLE 2.5 MG PO TABS: 2.5000 mg | ORAL_TABLET | Freq: Every day | ORAL | 6 refills | Status: DC

## 2018-11-29 MED FILL — LETROZOLE 2.5 MG TABS: 2.5 | 30 days supply | Qty: 30 | Fill #0

## 2018-11-29 NOTE — Progress Notes (Signed)
Hematology and Oncology Follow Up Visit  Stephanie Pierce [id number] March 17, 1951 68 y.o. 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  Unfortunately, she was found to have a new ductal carcinoma in situ of the right breast.  She was just going for a routine mammogram.  She had not noted any problems with the right breast.  The mammogram was done on 11/20/2018.  This showed a new 1 cm group of calcifications adjacent to the patient's previous right lumpectomy site.  She then underwent a biopsy.  This was done on 11/26/2018.  The pathology report ([id number]) showed high-grade ductal carcinoma in situ with necrosis and calcifications.  It was ER positive and PR positive.  She has not yet been referred to surgery.  She feels okay.  She is now married.  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  There is been no pain.  She is had no nausea or vomiting.  She has had no change in bowel or bladder habits.  She has had no fever.  She has had no headache.  Currently, her performance status is ECOG 1.  Medications:  Allergies as of 11/29/2018      Reactions   Iodine Hives   Lisinopril Swelling   Swelling in the throat   Penicillins Rash      Medication List       Accurate as of November 29, 2018  8:10 AM. Always use your most recent med list.        B-12 2500 MCG Tabs Take 1 tablet by mouth once a week.   CENTRUM SILVER ADULT 50+ PO Take 1 tablet by mouth every other day.   Ferrous Fumarate-Folic Acid 035-0 MG Tabs Commonly known as:  Hemocyte-F Take 1 tablet by mouth daily.   fluticasone 50 MCG/ACT nasal spray Commonly known as:  FLONASE Place 2 sprays into both nostrils daily.   KRILL OIL OMEGA-3 PO Take by mouth. Take 2 daily   levocetirizine 5 MG tablet Commonly known as:  XYZAL Take 1 tablet (5 mg  total) by mouth every evening.   losartan 100 MG tablet Commonly known as:  COZAAR Take 1 tablet (100 mg total) by mouth daily.   montelukast 10 MG tablet Commonly known as:  SINGULAIR Take 1 tablet (10 mg total) by mouth at bedtime.   OVER THE COUNTER MEDICATION Take 1 tablet by mouth daily. CALCIUM 650MG  / VIT D3   Synthroid 112 MCG tablet Generic drug:  levothyroxine Take 1 tablet (112 mcg total) by mouth daily before breakfast.   Viactiv Calcium Plus D 650-12.5-40 MG-MCG-MCG Chew Generic drug:  Calcium-Vitamin D-Vitamin K       Allergies:  Allergies  Allergen Reactions   Iodine Hives   Lisinopril Swelling    Swelling in the throat   Penicillins Rash    Past Medical History, Surgical history, Social history, and Family History were reviewed and updated.  Review of Systems: Review of Systems  Constitutional: Negative.   HENT: Negative.   Eyes: Negative.   Respiratory: Negative.   Cardiovascular: Negative.   Gastrointestinal: Negative.   Genitourinary: Negative.   Musculoskeletal: Negative.   Skin: Negative.   Neurological: Negative.   Endo/Heme/Allergies: Negative.   Psychiatric/Behavioral: Negative.      Physical Exam:  weight is 213 lb 8 oz (96.8 kg). Her oral  temperature is 98 F (36.7 C). Her blood pressure is 158/92 (abnormal) and her pulse is 87. Her respiration is 18 and oxygen saturation is 100%.   Wt Readings from Last 3 Encounters:  11/29/18 213 lb 8 oz (96.8 kg)  11/08/18 216 lb 6.4 oz (98.2 kg)  04/04/18 211 lb (95.7 kg)    Physical Exam Vitals signs reviewed.  Constitutional:      Comments: Her breast exam shows the right breast with the lumpectomy that is well-healed.  This is about the 10 o'clock position.  No obvious nodularity is noted in the right breast.  There is no right axillary adenopathy.  Left breast is unremarkable.  There is no left axillary adenopathy.  HENT:     Head: Normocephalic and atraumatic.  Eyes:     Pupils:  Pupils are equal, round, and reactive to light.  Neck:     Musculoskeletal: Normal range of motion.  Cardiovascular:     Rate and Rhythm: Normal rate and regular rhythm.     Heart sounds: Normal heart sounds.  Pulmonary:     Effort: Pulmonary effort is normal.     Breath sounds: Normal breath sounds.  Abdominal:     General: Bowel sounds are normal.     Palpations: Abdomen is soft.  Musculoskeletal: Normal range of motion.        General: No tenderness or deformity.  Lymphadenopathy:     Cervical: No cervical adenopathy.  Skin:    General: Skin is warm and dry.     Findings: No erythema or rash.  Neurological:     Mental Status: She is alert and oriented to person, place, and time.  Psychiatric:        Behavior: Behavior normal.        Thought Content: Thought content normal.        Judgment: Judgment normal.      Lab Results  Component Value Date   WBC 4.9 11/29/2018   HGB 11.1 (L) 11/29/2018   HCT 34.3 (L) 11/29/2018   MCV 86.6 11/29/2018   PLT 207 11/29/2018   Lab Results  Component Value Date   FERRITIN 43 06/08/2016   IRON 60 06/08/2016   TIBC 310 06/08/2016   UIBC 250 06/08/2016   IRONPCTSAT 19 (L) 06/08/2016   Lab Results  Component Value Date   RBC 3.96 11/29/2018   No results found for: KPAFRELGTCHN, LAMBDASER, KAPLAMBRATIO No results found for: IGGSERUM, IGA, IGMSERUM No results found for: Odetta Pink, SPEI   Chemistry      Component Value Date/Time   NA 139 11/08/2018 0916   NA 147 (H) 06/08/2017 0748   NA 140 12/07/2016 0836   K 4.0 11/08/2018 0916   K 4.1 06/08/2017 0748   K 4.5 12/07/2016 0836   CL 103 11/08/2018 0916   CL 109 (H) 06/08/2017 0748   CO2 29 11/08/2018 0916   CO2 29 06/08/2017 0748   CO2 28 12/07/2016 0836   BUN 40 (H) 11/08/2018 0916   BUN 39 (H) 06/08/2017 0748   BUN 34.7 (H) 12/07/2016 0836   CREATININE 1.96 (H) 11/08/2018 0916   CREATININE 2.1 (H) 06/08/2017  0748   CREATININE 2.2 (H) 12/07/2016 0836      Component Value Date/Time   CALCIUM 9.5 11/08/2018 0916   CALCIUM 9.3 06/08/2017 0748   CALCIUM 9.3 12/07/2016 0836   ALKPHOS 69 11/08/2018 0916   ALKPHOS 69 06/08/2017 0748   ALKPHOS 73 12/07/2016  0836   AST 22 11/08/2018 0916   AST 29 06/08/2017 0748   AST 23 12/07/2016 0836   ALT 18 11/08/2018 0916   ALT 27 06/08/2017 0748   ALT 21 12/07/2016 0836   BILITOT 0.5 11/08/2018 0916   BILITOT 0.70 06/08/2017 0748   BILITOT 0.55 12/07/2016 0836     Impression and Plan: Stephanie Pierce is a 54 yo African American female with DCIS of the right breast diagnosed in July 2015 with lumpectomy.  She also had radiation therapy after the lobectomy.  I think this was done in [place].  She elected to forego any hormonal therapy.   Now, I suspect that we have a new DCIS of the right breast.  She is thankfully with quite a bit of breast tissue.  As such, I do not think that she needs to have a mastectomy.  She will need be put on antiestrogen therapy.  I will get her started right now.  I will do this as I do not think that she will be able to have any radiation therapy.  I will use Femara.  I talked to her about Femara.  I explained her about the side effects of Femara, mostly being bony aches and pains.  She is taking vitamin D.  I told her to take 2000-3000 units daily.  I will see about getting her referred to Dr. Alphonsa Overall.  We will have her see him and that he will plan for the lobectomy.  I wonder if there is any role for intracavitary radiation treatments.  I will get her back to see [place] in about 6 weeks.  I spent about 45 minutes with her today.  Had to spend this amount of time as this is a new problem that we had to deal with.  All the time spent face-to-face with her.  I counseled her.  I had to arrange for follow-up and for her to see Dr. Lucia Gaskins.      Volanda Napoleon, MD 4/3/20208:10 AM

## 2018-12-04 DIAGNOSIS — D0591 Unspecified type of carcinoma in situ of right breast: Secondary | ICD-10-CM | POA: Diagnosis not present

## 2019-01-01 MED FILL — FLUTICASONE PROP 50 MCG SPR: 50 | 30 days supply | Qty: 16 | Fill #2

## 2019-01-01 MED FILL — LETROZOLE 2.5 MG TABS: 2.5 | 30 days supply | Qty: 30 | Fill #1

## 2019-01-07 ENCOUNTER — Other Ambulatory Visit: Payer: Self-pay | Admitting: Surgery

## 2019-01-08 ENCOUNTER — Other Ambulatory Visit: Payer: Self-pay | Admitting: Surgery

## 2019-01-08 DIAGNOSIS — D0591 Unspecified type of carcinoma in situ of right breast: Secondary | ICD-10-CM

## 2019-01-09 ENCOUNTER — Other Ambulatory Visit: Payer: Self-pay | Admitting: Surgery

## 2019-01-09 DIAGNOSIS — D0591 Unspecified type of carcinoma in situ of right breast: Secondary | ICD-10-CM

## 2019-01-10 ENCOUNTER — Ambulatory Visit: Payer: Medicare Other | Admitting: Hematology & Oncology

## 2019-01-10 ENCOUNTER — Other Ambulatory Visit: Payer: Medicare Other

## 2019-01-23 ENCOUNTER — Other Ambulatory Visit: Payer: Medicare Other

## 2019-01-23 ENCOUNTER — Ambulatory Visit: Payer: Medicare Other | Admitting: Hematology & Oncology

## 2019-01-27 ENCOUNTER — Other Ambulatory Visit: Payer: Self-pay | Admitting: *Deleted

## 2019-01-27 MED ORDER — LETROZOLE 2.5 MG PO TABS: 2.5000 mg | ORAL_TABLET | Freq: Every day | ORAL | 6 refills | Status: DC

## 2019-01-28 MED FILL — LETROZOLE 2.5 MG TABLET: 2.5 | 30 days supply | Qty: 30 | Fill #2

## 2019-01-29 ENCOUNTER — Other Ambulatory Visit: Payer: Self-pay

## 2019-01-29 MED ORDER — FLUTICASONE PROPIONATE 50 MCG/ACT NA SUSP: 2.0000 | Freq: Every day | NASAL | 2 refills | Status: DC

## 2019-01-29 MED FILL — FLUTICASONE PROP 50 MCG SPR: 50 | 30 days supply | Qty: 16 | Fill #0

## 2019-01-30 ENCOUNTER — Other Ambulatory Visit: Payer: Self-pay

## 2019-01-30 ENCOUNTER — Other Ambulatory Visit: Payer: Self-pay | Admitting: Family Medicine

## 2019-01-30 ENCOUNTER — Encounter (HOSPITAL_BASED_OUTPATIENT_CLINIC_OR_DEPARTMENT_OTHER): Payer: Self-pay | Admitting: *Deleted

## 2019-02-03 ENCOUNTER — Other Ambulatory Visit: Payer: Self-pay

## 2019-02-03 ENCOUNTER — Encounter (HOSPITAL_BASED_OUTPATIENT_CLINIC_OR_DEPARTMENT_OTHER)
Admission: RE | Admit: 2019-02-03 | Discharge: 2019-02-03 | Disposition: A | Discharge status: Home / Self Care | Payer: Medicare Other | Source: Ambulatory Visit | Attending: Surgery | Admitting: Surgery

## 2019-02-03 DIAGNOSIS — R9431 Abnormal electrocardiogram [ECG] [EKG]: Secondary | ICD-10-CM | POA: Diagnosis not present

## 2019-02-03 DIAGNOSIS — I10 Essential (primary) hypertension: Secondary | ICD-10-CM | POA: Diagnosis not present

## 2019-02-03 DIAGNOSIS — Z01810 Encounter for preprocedural cardiovascular examination: Secondary | ICD-10-CM | POA: Insufficient documentation

## 2019-02-03 DIAGNOSIS — Z1159 Encounter for screening for other viral diseases: Secondary | ICD-10-CM | POA: Diagnosis not present

## 2019-02-03 NOTE — Progress Notes (Addendum)
Ensure pre surgery drink given with instructions to complete by 0400 dos, pt verbalized understanding.  EKG reviewed by Dr. Sabra Heck, will proceed with surgery as scheduled.

## 2019-02-04 ENCOUNTER — Other Ambulatory Visit (HOSPITAL_COMMUNITY)
Admission: RE | Admit: 2019-02-04 | Discharge: 2019-02-04 | Disposition: A | Discharge status: Home / Self Care | Payer: Medicare Other | Source: Ambulatory Visit | Attending: Surgery | Admitting: Surgery

## 2019-02-04 DIAGNOSIS — Z01810 Encounter for preprocedural cardiovascular examination: Secondary | ICD-10-CM | POA: Diagnosis not present

## 2019-02-04 DIAGNOSIS — Z1159 Encounter for screening for other viral diseases: Secondary | ICD-10-CM | POA: Diagnosis not present

## 2019-02-04 DIAGNOSIS — I10 Essential (primary) hypertension: Secondary | ICD-10-CM | POA: Diagnosis not present

## 2019-02-04 DIAGNOSIS — R9431 Abnormal electrocardiogram [ECG] [EKG]: Secondary | ICD-10-CM | POA: Diagnosis not present

## 2019-02-05 LAB — NOVEL CORONAVIRUS, NAA (HOSP ORDER, SEND-OUT TO REF LAB; TAT 18-24 HRS): SARS-CoV-2, NAA: NOT DETECTED

## 2019-02-06 ENCOUNTER — Ambulatory Visit
Admission: RE | Admit: 2019-02-06 | Discharge: 2019-02-06 | Disposition: A | Discharge status: Home / Self Care | Payer: Medicare Other | Source: Ambulatory Visit | Attending: Surgery | Admitting: Surgery

## 2019-02-06 ENCOUNTER — Other Ambulatory Visit: Payer: Self-pay

## 2019-02-06 DIAGNOSIS — C50911 Malignant neoplasm of unspecified site of right female breast: Secondary | ICD-10-CM | POA: Diagnosis not present

## 2019-02-06 NOTE — H&P (Signed)
Stephanie Pierce  Location: [facility] Patient #: [id number] DOB: 03/12/1951 Single / Language: Cleophus Molt / Race: Black or African American Female  History of Present Illness   The patient is a 68 year old female who presents with a complaint of right breast cancer.  The PCP is Dr. Penni Homans  The patient was referred by Dr. Pearletha Alfred She comes by herself. [Corona virus days]  She has a history of a right breast cancer treated with lumpectomy and MammoSite in July 2015 in [place]. She moved to [facility] in 2016. She has been followed by Dr. Marin Olp since that time. 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 She was not placed on anti-hormonal therapy. She has no family history of breast cancer. She went through menopause about age 36.  She talked about falling and striking her right breast. This sounds like it time to about the same time as she had a mammogram. She had a mammogram on 11/20/2018 which at [facility] which showed an inderterminate Ca++ in the UOQ of the right breast. This is adjacent to a prior mammosite. 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 Marin Olp who has given her letrozole. She has not started it yet.  I discussed the options for breast cancer treatment with the patient. I discussed a multidisciplinary approach to the treatment of breast cancer, which includes medical oncology and radiation oncology. I discussed the surgical options of lumpectomy vs. mastectomy. If mastectomy, there is the possibility of reconstruction. I discussed the options of lymph node biopsy.  The risks of surgery include, but are not limited to, bleeding, infection, the need for further surgery, and nerve injury. The patient has been given literature on the treatment of breast cancer. She was discussed at the Breast  Cancer Conf this AM - radiation tx thought they had no additional role. I discussed this with her. She does not want a mastectomy. Lumpectomy would seem appropriate.  Plan: 1) Anti hormone medicine - she has Femara, but has not started it, 2) Right breast lumpectomy (seed localization) when corona virus threat has improved  Review of Systems as stated in this history (HPI) or in the review of systems. Otherwise all other 12 point ROS are negative  Past Medical History: 1. Right breast cancer - DICS - 2015  2. Pneumonia - 2000 in [place] (not sick) and 2010 in [place] - was sick 3. Had kidney injury form 2010 pneumonia 4. Sleeve gastrectomy - 2012 Her original weight was 245 He also did a HH repair.  Social History: Married - Albert. 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 She has clients in [place]. She uses [employer] for her web access. She is working on her PhD from the [employer] (I can not find out much about it on the internet). Her degree is for educational psychiatry.   Past Surgical History (Tanisha A. Owens Shark, [place]; 12/04/2018 10:17 AM) Cesarean Section - 1  Sleeve Gastrectomy   Diagnostic Studies History (Tanisha A. Owens Shark, [place]; 12/04/2018 10:17 AM) Mammogram  within last year Pap Smear  1-5 years ago  Allergies (Tanisha A. Owens Shark, [place]; 12/04/2018 10:18 AM) No Known Drug Allergies [  12/04/2018]: Allergies Reconciled   Medication History (Tanisha A. Owens Shark, [place]; 12/04/2018 10:19 AM) Letrozole (2.5MG  Tablet, Oral) Active. Losartan Potassium (100MG  Tablet, Oral) Active. Synthroid (112MCG Tablet, Oral) Active. Calcium (Oral) Specific strength unknown - Active. B12-Active (1MG  Tablet Chewable, Oral) Active. Vitamin D3 (Oral) Specific strength unknown - Active. Medications  Reconciled  Social History (Tanisha A. Owens Shark, [place]; 12/04/2018 10:17 AM) Caffeine use  Tea. No drug use  Tobacco use  Never smoker.  Family History (Tanisha A. Owens Shark, [place]; 12/04/2018 10:17 AM) Alcohol Abuse  Father. Diabetes Mellitus  Brother, Sister.  Pregnancy / Birth History (Tanisha A. Owens Shark, RMA; 12/04/2018 10:17 AM) Age at menarche  54 years. Age of menopause  [age] Contraceptive History  Oral contraceptives. Gravida  1 Maternal age  49-35 Para  1  Other Problems (Tanisha A. Owens Shark, [place]; 12/04/2018 10:17 AM) Breast Cancer  Other disease, cancer, significant illness     Review of Systems (Tanisha A. Brown RMA; 12/04/2018 10:17 AM) General Not Present- Appetite Loss, Chills, Fatigue, Fever, Night Sweats, Weight Gain and Weight Loss. Skin Not Present- Change in Wart/Mole, Dryness, Hives, Jaundice, New Lesions, Non-Healing Wounds, Rash and Ulcer. Cardiovascular Not Present- Chest Pain, Difficulty Breathing Lying Down, Leg Cramps, Palpitations, Rapid Heart Rate, Shortness of Breath and Swelling of Extremities. Gastrointestinal Not Present- Abdominal Pain, Bloating, Bloody Stool, Change in Bowel Habits, Chronic diarrhea, Constipation, Difficulty Swallowing, Excessive gas, Gets full quickly at meals, Hemorrhoids, Indigestion, Nausea, Rectal Pain and Vomiting. Musculoskeletal Not Present- Back Pain, Joint Pain, Joint Stiffness, Muscle Pain, Muscle Weakness and Swelling of Extremities. Neurological Not Present- Decreased Memory, Fainting, Headaches, Numbness, Seizures, Tingling, Tremor, Trouble walking and Weakness. Endocrine Not Present- Cold Intolerance, Excessive Hunger, Hair Changes, Heat Intolerance, Hot flashes and New Diabetes.  Vitals (Tanisha A. Brown RMA; 12/04/2018 10:18 AM) 12/04/2018 10:18 AM Weight: 214.8 lb Height: 67in Body Surface Area: 2.08 m Body Mass Index: 33.64 kg/m  Temp.: 98.64F  Pulse: 77 (Regular)  BP: 126/86 (Sitting, Left Arm,  Standard)   Physical Exam  General: WN AA F who is alert and generally healthy appearing. HEENT: Normal. Pupils equal.  Neck: Supple. No mass. No thyroid mass. Lymph Nodes: No supraclavicular or cervical nodes.  Lungs: Clear to auscultation and symmetric breath sounds. Heart: RRR. No murmur or rub.  Breasts: Right - Irregular lumpiness in the UOQ of the right breast - this is from the prior mammosite. She is tender at the biopsy site. The biopsy site is at 11 o'clock.             Left - circumareolar incision in the LIQ of areola  Abdomen: Soft. No mass. No tenderness. No hernia. Normal bowel sounds. Rectal: Not done.  Extremities: Good strength and ROM in upper and lower extremities.  Neurologic: Grossly intact to motor and sensory function. Psychiatric: Has normal mood and affect. Behavior is normal.  Assessment & Plan  1.  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  Right breast cancer - 2015  [facility] and [facility] -----------------------  On a 12/29/2013 mammogram, she had a new 1.4 cm cluster of microcalcifictions at 10 o'clock in the right breast.  She had a  lumpectomy by Dr. Murlean Hark on 7/1/2015j  Pathology on 02/25/2014 - Accession #: [id number] - DCIS, grade 2-3, 4 mm residual, margins clear, ER - 99%, PR - 20%, MIB-1 - 10% - signed Josue Hector, MD and Nolberto Hanlon, MD.  She had partial breast irradiation by Dr. Redmond School - 3400 cGy over 10 fractions in 5 days - 03/27/2014 - 04/02/2014  She was followed by Dr. Estill Cotta, [facility]  She decided not to have antihormone tx  3. Pneumonia - 2000 in [place] (not sick) and  2010 in [place] - was sick 4. Had kidney injury form 2010 pneumonia 5. Sleeve gastrectomy - 2012  Her original weight was 245  He also did a HH repair.  Alphonsa Overall, MD, [facility] Pager: [phone] Office phone:  [phone]

## 2019-02-07 ENCOUNTER — Ambulatory Visit (HOSPITAL_BASED_OUTPATIENT_CLINIC_OR_DEPARTMENT_OTHER)
Admission: RE | Admit: 2019-02-07 | Discharge: 2019-02-07 | Disposition: A | Discharge status: Home / Self Care | Payer: Medicare Other | Attending: Surgery | Admitting: Surgery

## 2019-02-07 ENCOUNTER — Ambulatory Visit (HOSPITAL_BASED_OUTPATIENT_CLINIC_OR_DEPARTMENT_OTHER): Payer: Medicare Other | Admitting: Certified Registered"

## 2019-02-07 ENCOUNTER — Encounter (HOSPITAL_BASED_OUTPATIENT_CLINIC_OR_DEPARTMENT_OTHER): Payer: Self-pay | Admitting: Certified Registered"

## 2019-02-07 ENCOUNTER — Other Ambulatory Visit: Payer: Self-pay

## 2019-02-07 ENCOUNTER — Encounter (HOSPITAL_BASED_OUTPATIENT_CLINIC_OR_DEPARTMENT_OTHER)
Admission: RE | Disposition: A | Discharge status: Home / Self Care | Payer: Self-pay | Source: Home / Self Care | Attending: Surgery

## 2019-02-07 ENCOUNTER — Ambulatory Visit
Admission: RE | Admit: 2019-02-07 | Discharge: 2019-02-07 | Disposition: A | Discharge status: Home / Self Care | Payer: Medicare Other | Source: Ambulatory Visit | Attending: Surgery | Admitting: Surgery

## 2019-02-07 DIAGNOSIS — Z79899 Other long term (current) drug therapy: Secondary | ICD-10-CM | POA: Diagnosis not present

## 2019-02-07 DIAGNOSIS — Z79890 Hormone replacement therapy (postmenopausal): Secondary | ICD-10-CM | POA: Diagnosis not present

## 2019-02-07 DIAGNOSIS — C50911 Malignant neoplasm of unspecified site of right female breast: Secondary | ICD-10-CM | POA: Diagnosis not present

## 2019-02-07 DIAGNOSIS — I10 Essential (primary) hypertension: Secondary | ICD-10-CM | POA: Insufficient documentation

## 2019-02-07 DIAGNOSIS — E039 Hypothyroidism, unspecified: Secondary | ICD-10-CM | POA: Insufficient documentation

## 2019-02-07 DIAGNOSIS — Z9884 Bariatric surgery status: Secondary | ICD-10-CM | POA: Insufficient documentation

## 2019-02-07 DIAGNOSIS — D0511 Intraductal carcinoma in situ of right breast: Secondary | ICD-10-CM | POA: Insufficient documentation

## 2019-02-07 DIAGNOSIS — D0591 Unspecified type of carcinoma in situ of right breast: Secondary | ICD-10-CM

## 2019-02-07 DIAGNOSIS — Z170 Estrogen receptor positive status [ER+]: Secondary | ICD-10-CM | POA: Diagnosis not present

## 2019-02-07 DIAGNOSIS — N6021 Fibroadenosis of right breast: Secondary | ICD-10-CM | POA: Diagnosis not present

## 2019-02-07 DIAGNOSIS — N6031 Fibrosclerosis of right breast: Secondary | ICD-10-CM | POA: Diagnosis not present

## 2019-02-07 HISTORY — PX: BREAST LUMPECTOMY WITH RADIOACTIVE SEED LOCALIZATION: SHX6424

## 2019-02-07 SURGERY — BREAST LUMPECTOMY WITH RADIOACTIVE SEED LOCALIZATION
Anesthesia: General | Site: Breast | Laterality: Right

## 2019-02-07 MED ORDER — ONDANSETRON HCL 4 MG/2ML IJ SOLN
INTRAMUSCULAR | Status: AC
  Filled 2019-02-07: qty 12

## 2019-02-07 MED ORDER — LIDOCAINE 2% (20 MG/ML) 5 ML SYRINGE
INTRAMUSCULAR | Status: AC
  Filled 2019-02-07: qty 15

## 2019-02-07 MED ORDER — PROPOFOL 10 MG/ML IV BOLUS
INTRAVENOUS | Status: DC | PRN
  Administered 2019-02-07: 200 mg via INTRAVENOUS

## 2019-02-07 MED ORDER — ACETAMINOPHEN 500 MG PO TABS
ORAL_TABLET | ORAL | Status: AC
  Filled 2019-02-07: qty 2

## 2019-02-07 MED ORDER — ONDANSETRON HCL 4 MG/2ML IJ SOLN
INTRAMUSCULAR | Status: DC | PRN
  Administered 2019-02-07: 4 mg via INTRAVENOUS

## 2019-02-07 MED ORDER — EPHEDRINE 5 MG/ML INJ
INTRAVENOUS | Status: AC
  Filled 2019-02-07: qty 10

## 2019-02-07 MED ORDER — CHLORHEXIDINE GLUCONATE CLOTH 2 % EX PADS: 6.0000 | MEDICATED_PAD | Freq: Once | CUTANEOUS | Status: DC

## 2019-02-07 MED ORDER — DEXAMETHASONE SODIUM PHOSPHATE 10 MG/ML IJ SOLN
INTRAMUSCULAR | Status: AC
  Filled 2019-02-07: qty 3

## 2019-02-07 MED ORDER — ACETAMINOPHEN 10 MG/ML IV SOLN: 1000.0000 mg | Freq: Once | INTRAVENOUS | Status: DC | PRN

## 2019-02-07 MED ORDER — PROMETHAZINE HCL 25 MG/ML IJ SOLN: 6.2500 mg | INTRAMUSCULAR | Status: DC | PRN

## 2019-02-07 MED ORDER — CIPROFLOXACIN IN D5W 400 MG/200ML IV SOLN
INTRAVENOUS | Status: AC
  Filled 2019-02-07: qty 200

## 2019-02-07 MED ORDER — SCOPOLAMINE 1 MG/3DAYS TD PT72: 1.0000 | MEDICATED_PATCH | Freq: Once | TRANSDERMAL | Status: DC | PRN

## 2019-02-07 MED ORDER — OXYCODONE HCL 5 MG/5ML PO SOLN: 5.0000 mg | Freq: Once | ORAL | Status: DC | PRN

## 2019-02-07 MED ORDER — CEFAZOLIN SODIUM-DEXTROSE 2-4 GM/100ML-% IV SOLN: 2.0000 g | INTRAVENOUS | Status: DC

## 2019-02-07 MED ORDER — ACETAMINOPHEN 160 MG/5ML PO SOLN: 325.0000 mg | Freq: Once | ORAL | Status: DC | PRN

## 2019-02-07 MED ORDER — PROPOFOL 500 MG/50ML IV EMUL
INTRAVENOUS | Status: AC
  Filled 2019-02-07: qty 100

## 2019-02-07 MED ORDER — CEFAZOLIN SODIUM-DEXTROSE 2-4 GM/100ML-% IV SOLN
INTRAVENOUS | Status: AC
  Filled 2019-02-07: qty 100

## 2019-02-07 MED ORDER — GABAPENTIN 300 MG PO CAPS: 300.0000 mg | ORAL_CAPSULE | ORAL | Status: DC

## 2019-02-07 MED ORDER — MEPERIDINE HCL 25 MG/ML IJ SOLN: 6.2500 mg | INTRAMUSCULAR | Status: DC | PRN

## 2019-02-07 MED ORDER — CIPROFLOXACIN IN D5W 400 MG/200ML IV SOLN
INTRAVENOUS | Status: DC | PRN
  Administered 2019-02-07: 400 mg via INTRAVENOUS

## 2019-02-07 MED ORDER — FENTANYL CITRATE (PF) 100 MCG/2ML IJ SOLN: 25.0000 ug | INTRAMUSCULAR | Status: DC | PRN

## 2019-02-07 MED ORDER — BUPIVACAINE-EPINEPHRINE (PF) 0.25% -1:200000 IJ SOLN
INTRAMUSCULAR | Status: AC
  Filled 2019-02-07: qty 150

## 2019-02-07 MED ORDER — ACETAMINOPHEN 500 MG PO TABS
1000.0000 mg | ORAL_TABLET | ORAL | Status: AC
  Administered 2019-02-07: 1000 mg via ORAL

## 2019-02-07 MED ORDER — GLYCOPYRROLATE PF 0.2 MG/ML IJ SOSY
PREFILLED_SYRINGE | INTRAMUSCULAR | Status: AC
  Filled 2019-02-07: qty 1

## 2019-02-07 MED ORDER — GLYCOPYRROLATE 0.2 MG/ML IJ SOLN
INTRAMUSCULAR | Status: DC | PRN
  Administered 2019-02-07: 0.1 mg via INTRAVENOUS

## 2019-02-07 MED ORDER — TRAMADOL HCL 50 MG PO TABS: 50.0000 mg | ORAL_TABLET | Freq: Four times a day (QID) | ORAL | 0 refills | Status: DC | PRN

## 2019-02-07 MED ORDER — LIDOCAINE HCL (CARDIAC) PF 100 MG/5ML IV SOSY
PREFILLED_SYRINGE | INTRAVENOUS | Status: DC | PRN
  Administered 2019-02-07: 60 mg via INTRAVENOUS

## 2019-02-07 MED ORDER — LACTATED RINGERS IV SOLN
INTRAVENOUS | Status: DC
  Administered 2019-02-07: 07:00:00 via INTRAVENOUS

## 2019-02-07 MED ORDER — MIDAZOLAM HCL 2 MG/2ML IJ SOLN: 1.0000 mg | INTRAMUSCULAR | Status: DC | PRN

## 2019-02-07 MED ORDER — ACETAMINOPHEN 325 MG PO TABS: 325.0000 mg | ORAL_TABLET | Freq: Once | ORAL | Status: DC | PRN

## 2019-02-07 MED ORDER — PROPOFOL 500 MG/50ML IV EMUL
INTRAVENOUS | Status: DC | PRN
  Administered 2019-02-07: 25 ug/kg/min via INTRAVENOUS

## 2019-02-07 MED ORDER — FENTANYL CITRATE (PF) 100 MCG/2ML IJ SOLN
INTRAMUSCULAR | Status: AC
  Filled 2019-02-07: qty 2

## 2019-02-07 MED ORDER — OXYCODONE HCL 5 MG PO TABS: 5.0000 mg | ORAL_TABLET | Freq: Once | ORAL | Status: DC | PRN

## 2019-02-07 MED ORDER — BUPIVACAINE-EPINEPHRINE 0.25% -1:200000 IJ SOLN
INTRAMUSCULAR | Status: DC | PRN
  Administered 2019-02-07: 30 mL

## 2019-02-07 MED ORDER — DEXAMETHASONE SODIUM PHOSPHATE 4 MG/ML IJ SOLN
INTRAMUSCULAR | Status: DC | PRN
  Administered 2019-02-07: 4 mg via INTRAVENOUS

## 2019-02-07 MED ORDER — FENTANYL CITRATE (PF) 100 MCG/2ML IJ SOLN
50.0000 ug | INTRAMUSCULAR | Status: AC | PRN
  Administered 2019-02-07 (×2): 25 ug via INTRAVENOUS
  Administered 2019-02-07: 50 ug via INTRAVENOUS

## 2019-02-07 MED ORDER — LACTATED RINGERS IV SOLN: INTRAVENOUS | Status: DC

## 2019-02-07 MED ORDER — GABAPENTIN 300 MG PO CAPS
ORAL_CAPSULE | ORAL | Status: AC
  Filled 2019-02-07: qty 1

## 2019-02-07 MED ORDER — PROPOFOL 10 MG/ML IV BOLUS
INTRAVENOUS | Status: AC
  Filled 2019-02-07: qty 20

## 2019-02-07 SURGICAL SUPPLY — 54 items
BENZOIN TINCTURE PRP APPL 2/3 (GAUZE/BANDAGES/DRESSINGS) IMPLANT
BINDER BREAST LRG (GAUZE/BANDAGES/DRESSINGS) IMPLANT
BINDER BREAST MEDIUM (GAUZE/BANDAGES/DRESSINGS) IMPLANT
BINDER BREAST XLRG (GAUZE/BANDAGES/DRESSINGS) IMPLANT
BINDER BREAST XXLRG (GAUZE/BANDAGES/DRESSINGS) ×3 IMPLANT
BLADE HEX COATED 2.75 (ELECTRODE) IMPLANT
BLADE SURG 10 STRL SS (BLADE) IMPLANT
BLADE SURG 15 STRL LF DISP TIS (BLADE) ×1 IMPLANT
BLADE SURG 15 STRL SS (BLADE) ×2
CANISTER SUC SOCK COL 7IN (MISCELLANEOUS) IMPLANT
CANISTER SUCT 1200ML W/VALVE (MISCELLANEOUS) ×3 IMPLANT
CHLORAPREP W/TINT 26 (MISCELLANEOUS) ×3 IMPLANT
CLIP VESOCCLUDE SM WIDE 6/CT (CLIP) ×3 IMPLANT
CLOSURE WOUND 1/4X4 (GAUZE/BANDAGES/DRESSINGS)
COVER BACK TABLE REUSABLE LG (DRAPES) ×3 IMPLANT
COVER MAYO STAND REUSABLE (DRAPES) ×3 IMPLANT
COVER PROBE W GEL 5X96 (DRAPES) ×3 IMPLANT
COVER WAND RF STERILE (DRAPES) IMPLANT
DECANTER SPIKE VIAL GLASS SM (MISCELLANEOUS) IMPLANT
DERMABOND ADVANCED (GAUZE/BANDAGES/DRESSINGS) ×2
DERMABOND ADVANCED .7 DNX12 (GAUZE/BANDAGES/DRESSINGS) ×1 IMPLANT
DRAPE LAPAROTOMY 100X72 PEDS (DRAPES) ×3 IMPLANT
DRAPE UTILITY XL STRL (DRAPES) ×3 IMPLANT
DRSG PAD ABDOMINAL 8X10 ST (GAUZE/BANDAGES/DRESSINGS) ×3 IMPLANT
ELECT COATED BLADE 2.86 ST (ELECTRODE) ×3 IMPLANT
ELECT REM PT RETURN 9FT ADLT (ELECTROSURGICAL) ×3
ELECTRODE REM PT RTRN 9FT ADLT (ELECTROSURGICAL) ×1 IMPLANT
GAUZE SPONGE 4X4 12PLY STRL LF (GAUZE/BANDAGES/DRESSINGS) ×3 IMPLANT
GLOVE BIOGEL PI IND STRL 7.0 (GLOVE) ×2 IMPLANT
GLOVE BIOGEL PI INDICATOR 7.0 (GLOVE) ×4
GLOVE SURG SIGNA 7.5 PF LTX (GLOVE) ×3 IMPLANT
GLOVE SURG SYN 7.5  E (GLOVE) ×2
GLOVE SURG SYN 7.5 E (GLOVE) ×1 IMPLANT
GOWN STRL REUS W/ TWL LRG LVL3 (GOWN DISPOSABLE) ×1 IMPLANT
GOWN STRL REUS W/ TWL XL LVL3 (GOWN DISPOSABLE) ×1 IMPLANT
GOWN STRL REUS W/TWL LRG LVL3 (GOWN DISPOSABLE) ×2
GOWN STRL REUS W/TWL XL LVL3 (GOWN DISPOSABLE) ×2
KIT MARKER MARGIN INK (KITS) ×3 IMPLANT
NEEDLE HYPO 25X1 1.5 SAFETY (NEEDLE) ×3 IMPLANT
NS IRRIG 1000ML POUR BTL (IV SOLUTION) ×3 IMPLANT
PACK BASIN DAY SURGERY FS (CUSTOM PROCEDURE TRAY) ×3 IMPLANT
PENCIL BUTTON HOLSTER BLD 10FT (ELECTRODE) ×3 IMPLANT
SHEET MEDIUM DRAPE 40X70 STRL (DRAPES) IMPLANT
SLEEVE SCD COMPRESS KNEE MED (MISCELLANEOUS) ×3 IMPLANT
SPONGE LAP 18X18 RF (DISPOSABLE) ×3 IMPLANT
STRIP CLOSURE SKIN 1/4X4 (GAUZE/BANDAGES/DRESSINGS) IMPLANT
SUT MNCRL AB 4-0 PS2 18 (SUTURE) ×3 IMPLANT
SUT VICRYL 3-0 CR8 SH (SUTURE) ×3 IMPLANT
SYR CONTROL 10ML LL (SYRINGE) ×3 IMPLANT
TOWEL GREEN STERILE FF (TOWEL DISPOSABLE) ×3 IMPLANT
TRAY FAXITRON CT DISP (TRAY / TRAY PROCEDURE) ×3 IMPLANT
TUBE CONNECTING 20'X1/4 (TUBING) ×1
TUBE CONNECTING 20X1/4 (TUBING) ×2 IMPLANT
YANKAUER SUCT BULB TIP NO VENT (SUCTIONS) ×3 IMPLANT

## 2019-02-07 NOTE — Anesthesia Procedure Notes (Signed)
Procedure Name: LMA Insertion Date/Time: 02/07/2019 7:35 AM Performed by: Signe Colt, CRNA Pre-anesthesia Checklist: Patient identified, Emergency Drugs available, Suction available and Patient being monitored Patient Re-evaluated:Patient Re-evaluated prior to induction Oxygen Delivery Method: Circle system utilized Preoxygenation: Pre-oxygenation with 100% oxygen Induction Type: IV induction Ventilation: Mask ventilation without difficulty LMA: LMA inserted LMA Size: 4.0 Number of attempts: 1 Airway Equipment and Method: Bite block Placement Confirmation: positive ETCO2 Tube secured with: Tape Dental Injury: Teeth and Oropharynx as per pre-operative assessment

## 2019-02-07 NOTE — Discharge Instructions (Signed)
CENTRAL  SURGERY - DISCHARGE INSTRUCTIONS TO PATIENT  Activity:  Driving - May drive tomorrow, if doing well and not taking pain meds   Lifting - No lifting more than 15 pounds for 5 days, then no limit  Wound Care:   Leave the incision dry for 2 days, then you may shower  Diet:  As tolerated  Follow up appointment:  Call Dr. Pollie Friar office [facility]) at [phone] for an appointment in 2 to 3 weeks..         We are doing "e" visits post op during this Covid-19 virus epidemic, our office will contact you about this arrangement.  If you have not heard from our office, call our office the day before your scheduled visit to make plans for your visit.  Medications and dosages:  Resume your home medications.  You have a prescription for:  Ultram  Call Dr. Lucia Gaskins or his office  [phone]) if you have:  Temperature greater than 100.4,  Persistent nausea and vomiting,  Severe uncontrolled pain,  Redness, tenderness, or signs of infection (pain, swelling, redness, odor or green/yellow discharge around the site),  Difficulty breathing, headache or visual disturbances,  Any other questions or concerns you may have after discharge.  In an emergency, call 911 or go to an Emergency Department at a nearby hospital.    Post Anesthesia Home Care Instructions  Activity: Get plenty of rest for the remainder of the day. A responsible individual must stay with you for 24 hours following the procedure.  For the next 24 hours, DO NOT: -Drive a car -Paediatric nurse -Drink alcoholic beverages -Take any medication unless instructed by your physician -Make any legal decisions or sign important papers.  Meals: Start with liquid foods such as gelatin or soup. Progress to regular foods as tolerated. Avoid greasy, spicy, heavy foods. If nausea and/or vomiting occur, drink only clear liquids until the nausea and/or vomiting subsides. Call your physician if vomiting  continues.  Special Instructions/Symptoms: Your throat may feel dry or sore from the anesthesia or the breathing tube placed in your throat during surgery. If this causes discomfort, gargle with warm salt water. The discomfort should disappear within 24 hours.  If you had a scopolamine patch placed behind your ear for the management of post- operative nausea and/or vomiting:  1. The medication in the patch is effective for 72 hours, after which it should be removed.  Wrap patch in a tissue and discard in the trash. Wash hands thoroughly with soap and water. 2. You may remove the patch earlier than 72 hours if you experience unpleasant side effects which may include dry mouth, dizziness or visual disturbances. 3. Avoid touching the patch. Wash your hands with soap and water after contact with the patch.

## 2019-02-07 NOTE — Op Note (Addendum)
02/07/2019  8:38 AM  PATIENT:  Stephanie Pierce DOB: Aug 03, 1951 MRN: [id number]  PREOP DIAGNOSIS:   RIGHT BREAST CANCER  POSTOP DIAGNOSIS:    Right breast cancer, 10 o'clock position (Tis, N0)  PROCEDURE:   Procedure(s): RIGHT BREAST LUMPECTOMY WITH RADIOACTIVE SEED LOCALIZATION  SURGEON:   Alphonsa Overall, M.D.  ANESTHESIA:   General  CRNA: Blocker, Ernesta Amble, CRNA  General  EBL:  minimal  ml  DRAINS:  none   LOCAL MEDICATIONS USED:   30 cc 1/4% marcaine  SPECIMEN:   Right breast lumpectomy  COUNTS CORRECT:  YES  INDICATIONS FOR PROCEDURE:  Stephanie Pierce is a 68 y.o. (DOB: 27-Apr-1951) AA female whose primary care physician is Mosie Lukes, MD and comes for right breast lumpectomy.   She had a prior DCIS in 2015 treated with lumpectomy and partial breast irradiation (Mammosite).  She had a new area of DCIS near the prior lumpectomy.     The options for breast cancer treatment have been discussed with the patient. She elected to proceed with lumpectomy.     The indications and potential complications of surgery were explained to the patient. Potential complications include, but are not limited to, bleeding, infection, the need for further surgery, and nerve injury.     She had a I131 seed placed on 02/06/2019 in her right breast at [facility].  The seed is in the 10 o'clock position of the right breast.     OPERATIVE NOTE:   The patient was taken to operating room # 8 at [facility] where she underwent a general anesthesia  supervised by CRNA: Blocker, Ernesta Amble, CRNA. Her right breast and axilla were prepped with  ChloraPrep and sterilely draped.    A time-out and the surgical check list was reviewed.    The cancer which was about at the 10 o'clock position of the right breast.   She had an irregular UOQ of the right breast because of her prior Mammosite, so I elected to make my incision directly over the tumor.  I used the Neoprobe to identify the I131  seed.  I tried to excise an area around the tumor of at least 1 cm.    I excised this block of breast tissue approximately 4 cm by 4 cm  in diameter.   I painted the lumpectomy specimen with the 6 color paint kit and did a specimen mammogram which confirmed the mass, clip, and the seed were all in the right position in the specimen.  The specimen was sent to pathology who called back to confirm that they have the seed and the specimen.   The closest margin was the superior margin, so I took an additional superior margin.  I marked the margin with a suture anteriorly and painted it.   I then irrigated the wound with saline. I infiltrated approximately 30 mL of 1/4% Marcaine between the incisions. I placed 4 clips to mark biopsy cavity, at 12, 3, 6, and 9 o'clock.  I then closed the wound in layers using 3-0 Vicryl sutures for the deep layer. At the skin, I closed the incision with a 4-0 Monocryl suture. The incision was then painted with Dermabond.  She had gauze place over the wounds and placed in a breast binder.   The patient tolerated the procedure well, was transported to the recovery room in good condition. Sponge and needle count were correct at the end of the case.   Final pathology is  pending.    Right breast lumpectomy  Alphonsa Overall, MD, [facility] Pager: [phone] Office phone:  [phone]

## 2019-02-07 NOTE — Interval H&P Note (Signed)
History and Physical Interval Note:  02/07/2019 7:28 AM  Stephanie Pierce  has presented today for surgery, with the diagnosis of RIGHT BREAST CANCER.  The various methods of treatment have been discussed with the patient and family.   She does not want her husband to know about this current cancer.  After consideration of risks, benefits and other options for treatment, the patient has consented to  Procedure(s): RIGHT BREAST LUMPECTOMY WITH RADIOACTIVE SEED LOCALIZATION (Right) as a surgical intervention.  The patient's history has been reviewed, patient examined, no change in status, stable for surgery.  I have reviewed the patient's chart and labs.  Questions were answered to the patient's satisfaction.     Shann Medal

## 2019-02-07 NOTE — Transfer of Care (Signed)
Immediate Anesthesia Transfer of Care Note  Patient: Stephanie Pierce  Procedure(s) Performed: RIGHT BREAST LUMPECTOMY WITH RADIOACTIVE SEED LOCALIZATION (Right Breast)  Patient Location: PACU  Anesthesia Type:General  Level of Consciousness: awake, alert , oriented and patient cooperative  Airway & Oxygen Therapy: Patient Spontanous Breathing and Patient connected to nasal cannula oxygen  Post-op Assessment: Report given to RN and Post -op Vital signs reviewed and stable  Post vital signs: Reviewed and stable  Last Vitals:  Vitals Value Taken Time  BP    Temp    Pulse 99 02/07/19 0844  Resp 10 02/07/19 0844  SpO2 100 % 02/07/19 0844  Vitals shown include unvalidated device data.  Last Pain:  Vitals:   02/07/19 0642  TempSrc: Oral  PainSc: 0-No pain      Patients Stated Pain Goal: 0 (95/62/13 0865)  Complications: No apparent anesthesia complications

## 2019-02-07 NOTE — Anesthesia Postprocedure Evaluation (Signed)
Anesthesia Post Note  Patient: Christal Lagerstrom  Procedure(s) Performed: RIGHT BREAST LUMPECTOMY WITH RADIOACTIVE SEED LOCALIZATION (Right Breast)     Patient location during evaluation: PACU Anesthesia Type: General Level of consciousness: awake and alert Pain management: pain level controlled Vital Signs Assessment: post-procedure vital signs reviewed and stable Respiratory status: spontaneous breathing, nonlabored ventilation, respiratory function stable and patient connected to nasal cannula oxygen Cardiovascular status: blood pressure returned to baseline and stable Postop Assessment: no apparent nausea or vomiting Anesthetic complications: no    Last Vitals:  Vitals:   02/07/19 0915 02/07/19 0942  BP: 126/68 132/70  Pulse: 81 67  Resp: 11 18  Temp:  36.8 C  SpO2: 100% 98%    Last Pain:  Vitals:   02/07/19 0942  TempSrc:   PainSc: 0-No pain                 Effie Berkshire

## 2019-02-07 NOTE — Anesthesia Preprocedure Evaluation (Addendum)
Anesthesia Evaluation  Patient identified by MRN, date of birth, ID band Patient awake    Reviewed: Allergy & Precautions, NPO status , Patient's Chart, lab work & pertinent test results  Airway Mallampati: I  TM Distance: >3 FB Neck ROM: Full    Dental  (+) Teeth Intact, Dental Advisory Given   Pulmonary neg pulmonary ROS,    breath sounds clear to auscultation       Cardiovascular hypertension, Pt. on medications  Rhythm:Regular Rate:Normal     Neuro/Psych negative neurological ROS  negative psych ROS   GI/Hepatic negative GI ROS, Neg liver ROS,   Endo/Other  Hypothyroidism   Renal/GU      Musculoskeletal   Abdominal Normal abdominal exam  (+)   Peds  Hematology   Anesthesia Other Findings   Reproductive/Obstetrics                            Anesthesia Physical Anesthesia Plan  ASA: II  Anesthesia Plan: General   Post-op Pain Management:    Induction: Intravenous  PONV Risk Score and Plan: 4 or greater and Ondansetron, Dexamethasone, Midazolam and Scopolamine patch - Pre-op  Airway Management Planned: LMA  Additional Equipment: None  Intra-op Plan:   Post-operative Plan: Extubation in OR  Informed Consent: I have reviewed the patients History and Physical, chart, labs and discussed the procedure including the risks, benefits and alternatives for the proposed anesthesia with the patient or authorized representative who has indicated his/her understanding and acceptance.     Dental advisory given  Plan Discussed with: CRNA  Anesthesia Plan Comments: (COVID-19 Labs  No results for input(s): DDIMER, FERRITIN, LDH, CRP in the last 72 hours.  Lab Results      Component                Value               Date                      [id number]              NOT DETECTED        02/04/2019            )       Anesthesia Quick Evaluation

## 2019-02-10 ENCOUNTER — Encounter (HOSPITAL_BASED_OUTPATIENT_CLINIC_OR_DEPARTMENT_OTHER): Payer: Self-pay | Admitting: Surgery

## 2019-02-24 MED FILL — LOSARTAN POTASSIUM 100 MG T: 100 | 90 days supply | Qty: 90 | Fill #0

## 2019-02-24 MED FILL — FLUTICASONE PROP 50 MCG SPR: 50 | 30 days supply | Qty: 16 | Fill #0

## 2019-02-26 ENCOUNTER — Inpatient Hospital Stay (HOSPITAL_BASED_OUTPATIENT_CLINIC_OR_DEPARTMENT_OTHER): Payer: Medicare Other | Admitting: Hematology & Oncology

## 2019-02-26 ENCOUNTER — Inpatient Hospital Stay: Payer: Medicare Other | Attending: Hematology & Oncology

## 2019-02-26 ENCOUNTER — Other Ambulatory Visit: Payer: Self-pay

## 2019-02-26 VITALS — BP 137/90 | HR 64 | Temp 98.3°F | Resp 18 | Wt 217.5 lb

## 2019-02-26 DIAGNOSIS — Z923 Personal history of irradiation: Secondary | ICD-10-CM | POA: Insufficient documentation

## 2019-02-26 DIAGNOSIS — C3491 Malignant neoplasm of unspecified part of right bronchus or lung: Secondary | ICD-10-CM | POA: Insufficient documentation

## 2019-02-26 DIAGNOSIS — C3492 Malignant neoplasm of unspecified part of left bronchus or lung: Secondary | ICD-10-CM

## 2019-02-26 DIAGNOSIS — Z170 Estrogen receptor positive status [ER+]: Secondary | ICD-10-CM

## 2019-02-26 DIAGNOSIS — Z79899 Other long term (current) drug therapy: Secondary | ICD-10-CM | POA: Diagnosis not present

## 2019-02-26 DIAGNOSIS — R232 Flushing: Secondary | ICD-10-CM | POA: Diagnosis not present

## 2019-02-26 DIAGNOSIS — Z79811 Long term (current) use of aromatase inhibitors: Secondary | ICD-10-CM | POA: Diagnosis not present

## 2019-02-26 DIAGNOSIS — D0510 Intraductal carcinoma in situ of unspecified breast: Secondary | ICD-10-CM

## 2019-02-26 DIAGNOSIS — D0511 Intraductal carcinoma in situ of right breast: Secondary | ICD-10-CM | POA: Diagnosis not present

## 2019-02-26 DIAGNOSIS — E559 Vitamin D deficiency, unspecified: Secondary | ICD-10-CM

## 2019-02-26 LAB — CMP (CANCER CENTER ONLY)
ALT: 19 U/L (ref 0–44)
AST: 21 U/L (ref 15–41)
Albumin: 3.7 g/dL (ref 3.5–5.0)
Alkaline Phosphatase: 73 U/L (ref 38–126)
Anion gap: 8 (ref 5–15)
BUN: 40 mg/dL — ABNORMAL HIGH (ref 8–23)
CO2: 27 mmol/L (ref 22–32)
Calcium: 9.8 mg/dL (ref 8.9–10.3)
Chloride: 107 mmol/L (ref 98–111)
Creatinine: 2 mg/dL — ABNORMAL HIGH (ref 0.44–1.00)
GFR, Est AFR Am: 29 mL/min — ABNORMAL LOW (ref >60)
GFR, Estimated: 25 mL/min — ABNORMAL LOW (ref >60)
Glucose, Bld: 93 mg/dL (ref 70–99)
Potassium: 4.3 mmol/L (ref 3.5–5.1)
Sodium: 142 mmol/L (ref 135–145)
Total Bilirubin: 0.5 mg/dL (ref 0.3–1.2)
Total Protein: 7.1 g/dL (ref 6.5–8.1)

## 2019-02-26 LAB — CBC WITH DIFFERENTIAL (CANCER CENTER ONLY)
Abs Immature Granulocytes: 0 10*3/uL (ref 0.00–0.07)
Basophils Absolute: 0.1 10*3/uL (ref 0.0–0.1)
Basophils Relative: 1 %
Eosinophils Absolute: 0.3 10*3/uL (ref 0.0–0.5)
Eosinophils Relative: 6 %
HCT: 33.9 % — ABNORMAL LOW (ref 36.0–46.0)
Hemoglobin: 10.9 g/dL — ABNORMAL LOW (ref 12.0–15.0)
Immature Granulocytes: 0 %
Lymphocytes Relative: 30 %
Lymphs Abs: 1.6 10*3/uL (ref 0.7–4.0)
MCH: 27.7 pg (ref 26.0–34.0)
MCHC: 32.2 g/dL (ref 30.0–36.0)
MCV: 86 fL (ref 80.0–100.0)
Monocytes Absolute: 0.6 10*3/uL (ref 0.1–1.0)
Monocytes Relative: 12 %
Neutro Abs: 2.7 10*3/uL (ref 1.7–7.7)
Neutrophils Relative %: 51 %
Platelet Count: 161 10*3/uL (ref 150–400)
RBC: 3.94 MIL/uL (ref 3.87–5.11)
RDW: 13.7 % (ref 11.5–15.5)
WBC Count: 5.3 10*3/uL (ref 4.0–10.5)
nRBC: 0 % (ref 0.0–0.2)

## 2019-02-26 LAB — LACTATE DEHYDROGENASE: LDH: 159 U/L (ref 98–192)

## 2019-02-26 NOTE — Progress Notes (Signed)
Hematology and Oncology Follow Up Visit  Stephanie Pierce [id number] 10-20-1950 68 y.o. 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 daily   Interim History: Stephanie Pierce is here today for follow-up.  Unfortunately, she was found to have a new ductal carcinoma in situ of the right breast.    She underwent a biopsy.  This was done on 11/26/2018.  The pathology report ([id number]) showed high-grade ductal carcinoma in situ with necrosis and calcifications.  It was ER positive and PR positive.  She was referred to surgery.  She saw Dr. Lucia Gaskins.  He did a lumpectomy on 02/07/2019.  The pathology report ([id number]) showed no residual carcinoma.  There was dense fibrosis.  No sentinel lymph nodes were biopsied.  She is doing well.  She had no problems with surgery.  She now has gotten her PhD.  Her Theotis Barrio will be published.  Hopefully this will impact quite a few people.  She is doing well on the Femara.  She is having some hot flashes with this.  She thinks this might be getting a little bit better.  She has had no issues with bowels or bladder.  She is had no problems with rashes.  Has had no arthralgias.  She is on vitamin D.  She has not yet been referred to surgery.  Currently, her performance status is ECOG 1.  Medications:  Allergies as of 02/26/2019      Reactions   Iodine Hives   Lisinopril Swelling   Swelling in the throat   Penicillins Rash      Medication List       Accurate as of February 26, 2019  8:16 AM. If you have any questions, ask your nurse or doctor.        B-12 2500 MCG Tabs Take 1 tablet by mouth once a week.   CENTRUM SILVER ADULT 50+ PO Take 1 tablet by mouth every other day.   fluticasone 50 MCG/ACT nasal spray Commonly known as: FLONASE Place 2 sprays into both nostrils daily.   KRILL OIL OMEGA-3 PO Take by mouth. Take 2 daily    letrozole 2.5 MG tablet Commonly known as: FEMARA Take 1 tablet (2.5 mg total) by mouth daily.   losartan 100 MG tablet Commonly known as: COZAAR TAKE 1 TABLET (100 MG TOTAL) BY MOUTH DAILY.   montelukast 10 MG tablet Commonly known as: SINGULAIR Take 1 tablet (10 mg total) by mouth at bedtime.   OVER THE COUNTER MEDICATION Take 1 tablet by mouth daily. CALCIUM 650MG  / VIT D3   Synthroid 112 MCG tablet Generic drug: levothyroxine Take 1 tablet (112 mcg total) by mouth daily before breakfast.   traMADol 50 MG tablet Commonly known as: Ultram Take 1-2 tablets (50-100 mg total) by mouth every 6 (six) hours as needed for moderate pain or severe pain.       Allergies:  Allergies  Allergen Reactions  . Iodine Hives  . Lisinopril Swelling    Swelling in the throat  . Penicillins Rash    Past Medical History, Surgical history, Social history, and Family History were reviewed and updated.  Review of Systems: Review of Systems  Constitutional: Negative.   HENT: Negative.   Eyes: Negative.   Respiratory: Negative.   Cardiovascular: Negative.   Gastrointestinal: Negative.   Genitourinary: Negative.   Musculoskeletal: Negative.   Skin: Negative.  Neurological: Negative.   Endo/Heme/Allergies: Negative.   Psychiatric/Behavioral: Negative.      Physical Exam:  vitals were not taken for this visit.   Wt Readings from Last 3 Encounters:  02/07/19 215 lb 2.7 oz (97.6 kg)  11/29/18 213 lb 8 oz (96.8 kg)  11/08/18 216 lb 6.4 oz (98.2 kg)    Physical Exam Vitals signs reviewed.  Constitutional:      Comments: Her breast exam shows the right breast with the lumpectomy that is well-healed.  This is about the 10 o'clock position.  No obvious nodularity is noted in the right breast.  There is no right axillary adenopathy.  Left breast is unremarkable.  There is no left axillary adenopathy.  HENT:     Head: Normocephalic and atraumatic.  Eyes:     Pupils: Pupils are  equal, round, and reactive to light.  Neck:     Musculoskeletal: Normal range of motion.  Cardiovascular:     Rate and Rhythm: Normal rate and regular rhythm.     Heart sounds: Normal heart sounds.  Pulmonary:     Effort: Pulmonary effort is normal.     Breath sounds: Normal breath sounds.  Abdominal:     General: Bowel sounds are normal.     Palpations: Abdomen is soft.  Musculoskeletal: Normal range of motion.        General: No tenderness or deformity.  Lymphadenopathy:     Cervical: No cervical adenopathy.  Skin:    General: Skin is warm and dry.     Findings: No erythema or rash.  Neurological:     Mental Status: She is alert and oriented to person, place, and time.  Psychiatric:        Behavior: Behavior normal.        Thought Content: Thought content normal.        Judgment: Judgment normal.      Lab Results  Component Value Date   WBC 5.3 02/26/2019   HGB 10.9 (L) 02/26/2019   HCT 33.9 (L) 02/26/2019   MCV 86.0 02/26/2019   PLT 161 02/26/2019   Lab Results  Component Value Date   FERRITIN 43 06/08/2016   IRON 60 06/08/2016   TIBC 310 06/08/2016   UIBC 250 06/08/2016   IRONPCTSAT 19 (L) 06/08/2016   Lab Results  Component Value Date   RBC 3.94 02/26/2019   No results found for: KPAFRELGTCHN, LAMBDASER, KAPLAMBRATIO No results found for: IGGSERUM, IGA, IGMSERUM No results found for: Odetta Pink, SPEI   Chemistry      Component Value Date/Time   NA 139 11/29/2018 0754   NA 147 (H) 06/08/2017 0748   NA 140 12/07/2016 0836   K 4.5 11/29/2018 0754   K 4.1 06/08/2017 0748   K 4.5 12/07/2016 0836   CL 104 11/29/2018 0754   CL 109 (H) 06/08/2017 0748   CO2 28 11/29/2018 0754   CO2 29 06/08/2017 0748   CO2 28 12/07/2016 0836   BUN 35 (H) 11/29/2018 0754   BUN 39 (H) 06/08/2017 0748   BUN 34.7 (H) 12/07/2016 0836   CREATININE 2.14 (H) 11/29/2018 0754   CREATININE 2.1 (H) 06/08/2017 0748    CREATININE 2.2 (H) 12/07/2016 0836      Component Value Date/Time   CALCIUM 9.6 11/29/2018 0754   CALCIUM 9.3 06/08/2017 0748   CALCIUM 9.3 12/07/2016 0836   ALKPHOS 63 11/29/2018 0754   ALKPHOS 69 06/08/2017 0748   ALKPHOS 73  12/07/2016 0836   AST 20 11/29/2018 0754   AST 23 12/07/2016 0836   ALT 18 11/29/2018 0754   ALT 27 06/08/2017 0748   ALT 21 12/07/2016 0836   BILITOT 0.6 11/29/2018 0754   BILITOT 0.55 12/07/2016 0836     Impression and Plan: Stephanie Pierce is a 4 yo African American female with DCIS of the right breast diagnosed in July 2015 with lumpectomy.  She also had radiation therapy after the lobectomy.  I think this was done in [place].  She elected to forego any hormonal therapy.   We now have a new primaries.  Thankfully, this is not invasive carcinoma.  She is already had radiation therapy to the right breast so she would not be a candidate for any additional radiation therapy.  She is on Femara.  We will keep her on Femara for right now.  She will come back to see [place] in 3 months.  I think this would be reasonable.  I spent about 35 minutes with her today.  This was a new problem that we had.  She had the surgery.  I reviewed the pathology report with her.  I am just glad that this was not invasive.        Volanda Napoleon, MD 7/1/20208:16 AM

## 2019-03-20 ENCOUNTER — Ambulatory Visit: Payer: Medicare Other | Admitting: Skilled Nursing Facility1

## 2019-03-21 MED FILL — LETROZOLE 2.5 MG TABLET: 2.5 | 30 days supply | Qty: 30 | Fill #3

## 2019-04-14 ENCOUNTER — Encounter: Payer: Self-pay | Admitting: Family Medicine

## 2019-04-22 DIAGNOSIS — Z23 Encounter for immunization: Secondary | ICD-10-CM | POA: Diagnosis not present

## 2019-04-25 MED FILL — FLUTICASONE PROP 50 MCG SPR: 50 | 30 days supply | Qty: 16 | Fill #1

## 2019-04-25 MED FILL — LETROZOLE 2.5 MG TABLET: 2.5 | 30 days supply | Qty: 30 | Fill #4

## 2019-05-29 ENCOUNTER — Other Ambulatory Visit: Payer: Medicare Other

## 2019-05-29 ENCOUNTER — Ambulatory Visit: Payer: Medicare Other | Admitting: Hematology & Oncology

## 2019-05-30 MED FILL — LETROZOLE 2.5 MG TAB: 2.5 | 30 days supply | Qty: 30 | Fill #5

## 2019-06-30 MED FILL — LETROZOLE 2.5 MG TABS: 2.5 | 30 days supply | Qty: 30 | Fill #6

## 2019-06-30 MED FILL — FLUTICASONE PROP 50 MCG SPR: 50 | 30 days supply | Qty: 16 | Fill #2

## 2019-07-01 ENCOUNTER — Other Ambulatory Visit: Payer: Self-pay | Admitting: *Deleted

## 2019-07-01 DIAGNOSIS — D0510 Intraductal carcinoma in situ of unspecified breast: Secondary | ICD-10-CM

## 2019-07-02 ENCOUNTER — Other Ambulatory Visit: Payer: Self-pay

## 2019-07-02 ENCOUNTER — Telehealth: Payer: Self-pay | Admitting: Hematology & Oncology

## 2019-07-02 ENCOUNTER — Encounter: Payer: Self-pay | Admitting: Hematology & Oncology

## 2019-07-02 ENCOUNTER — Inpatient Hospital Stay: Payer: Medicare Other | Attending: Hematology & Oncology

## 2019-07-02 ENCOUNTER — Inpatient Hospital Stay (HOSPITAL_BASED_OUTPATIENT_CLINIC_OR_DEPARTMENT_OTHER): Payer: Medicare Other | Admitting: Hematology & Oncology

## 2019-07-02 VITALS — BP 136/92 | HR 85 | Temp 97.8°F | Resp 20 | Wt 212.0 lb

## 2019-07-02 DIAGNOSIS — D0511 Intraductal carcinoma in situ of right breast: Secondary | ICD-10-CM | POA: Insufficient documentation

## 2019-07-02 DIAGNOSIS — D0510 Intraductal carcinoma in situ of unspecified breast: Secondary | ICD-10-CM | POA: Diagnosis not present

## 2019-07-02 DIAGNOSIS — Z79811 Long term (current) use of aromatase inhibitors: Secondary | ICD-10-CM | POA: Diagnosis not present

## 2019-07-02 DIAGNOSIS — R918 Other nonspecific abnormal finding of lung field: Secondary | ICD-10-CM | POA: Insufficient documentation

## 2019-07-02 DIAGNOSIS — Z923 Personal history of irradiation: Secondary | ICD-10-CM | POA: Insufficient documentation

## 2019-07-02 DIAGNOSIS — R7989 Other specified abnormal findings of blood chemistry: Secondary | ICD-10-CM | POA: Diagnosis not present

## 2019-07-02 DIAGNOSIS — Z170 Estrogen receptor positive status [ER+]: Secondary | ICD-10-CM | POA: Diagnosis not present

## 2019-07-02 DIAGNOSIS — E559 Vitamin D deficiency, unspecified: Secondary | ICD-10-CM

## 2019-07-02 LAB — CBC WITH DIFFERENTIAL (CANCER CENTER ONLY)
Abs Immature Granulocytes: 0.01 10*3/uL (ref 0.00–0.07)
Basophils Absolute: 0.1 10*3/uL (ref 0.0–0.1)
Basophils Relative: 1 %
Eosinophils Absolute: 0.2 10*3/uL (ref 0.0–0.5)
Eosinophils Relative: 4 %
HCT: 35.7 % — ABNORMAL LOW (ref 36.0–46.0)
Hemoglobin: 11.6 g/dL — ABNORMAL LOW (ref 12.0–15.0)
Immature Granulocytes: 0 %
Lymphocytes Relative: 30 %
Lymphs Abs: 1.6 10*3/uL (ref 0.7–4.0)
MCH: 27.7 pg (ref 26.0–34.0)
MCHC: 32.5 g/dL (ref 30.0–36.0)
MCV: 85.2 fL (ref 80.0–100.0)
Monocytes Absolute: 0.5 10*3/uL (ref 0.1–1.0)
Monocytes Relative: 10 %
Neutro Abs: 2.9 10*3/uL (ref 1.7–7.7)
Neutrophils Relative %: 55 %
Platelet Count: 167 10*3/uL (ref 150–400)
RBC: 4.19 MIL/uL (ref 3.87–5.11)
RDW: 13.2 % (ref 11.5–15.5)
WBC Count: 5.3 10*3/uL (ref 4.0–10.5)
nRBC: 0 % (ref 0.0–0.2)

## 2019-07-02 LAB — COMPREHENSIVE METABOLIC PANEL
ALT: 21 U/L (ref 0–44)
AST: 24 U/L (ref 15–41)
Albumin: 4 g/dL (ref 3.5–5.0)
Alkaline Phosphatase: 64 U/L (ref 38–126)
Anion gap: 7 (ref 5–15)
BUN: 41 mg/dL — ABNORMAL HIGH (ref 8–23)
CO2: 28 mmol/L (ref 22–32)
Calcium: 9.8 mg/dL (ref 8.9–10.3)
Chloride: 106 mmol/L (ref 98–111)
Creatinine, Ser: 2.36 mg/dL — ABNORMAL HIGH (ref 0.44–1.00)
GFR calc Af Amer: 24 mL/min — ABNORMAL LOW (ref >60)
GFR calc non Af Amer: 21 mL/min — ABNORMAL LOW (ref >60)
Glucose, Bld: 93 mg/dL (ref 70–99)
Potassium: 4 mmol/L (ref 3.5–5.1)
Sodium: 141 mmol/L (ref 135–145)
Total Bilirubin: 0.6 mg/dL (ref 0.3–1.2)
Total Protein: 7.5 g/dL (ref 6.5–8.1)

## 2019-07-02 LAB — LACTATE DEHYDROGENASE: LDH: 181 U/L (ref 98–192)

## 2019-07-02 NOTE — Progress Notes (Signed)
Hematology and Oncology Follow Up Visit  Stephanie Pierce [id number] 1951-05-17 68 y.o. 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 daily   Interim History: Stephanie Pierce is here today for follow-up.  She is doing quite well.  She does not have as many hot flashes or sweats now.  She is doing well with the Femara.  She has had no problems with fatigue or weakness.  There is no bony pain.  She is trying to exercise.  She bought herself a treadmill.  She is to go to the [facility] but is afraid to go because of the coronavirus.  She has had no cough.  There is been no change in bowel or bladder habits.  She has had no headache.  She has been very cautious with the coronavirus.  She and Stephanie Pierce will probably be by himself as for the holidays.  Overall, Stephanie performance status is ECOG 0.   Medications:  Allergies as of 07/02/2019      Reactions   Iodine Hives   Lisinopril Swelling   Swelling in the throat   Penicillins Rash      Medication List       Accurate as of July 02, 2019  8:17 AM. If you have any questions, ask your nurse or doctor.        B-12 2500 MCG Tabs Take 1 tablet by mouth once a week.   CENTRUM SILVER ADULT 50+ PO Take 1 tablet by mouth every other day.   fluticasone 50 MCG/ACT nasal spray Commonly known as: FLONASE Place 2 sprays into both nostrils daily.   KRILL OIL OMEGA-3 PO Take by mouth daily.   letrozole 2.5 MG tablet Commonly known as: FEMARA Take 1 tablet (2.5 mg total) by mouth daily.   losartan 100 MG tablet Commonly known as: COZAAR TAKE 1 TABLET (100 MG TOTAL) BY MOUTH DAILY.   montelukast 10 MG tablet Commonly known as: SINGULAIR Take 1 tablet (10 mg total) by mouth at bedtime.   OVER THE COUNTER MEDICATION Take 1 tablet by mouth daily. CALCIUM 650MG  / VIT D3   Synthroid 112 MCG tablet Generic drug:  levothyroxine Take 1 tablet (112 mcg total) by mouth daily before breakfast.   traMADol 50 MG tablet Commonly known as: Ultram Take 1-2 tablets (50-100 mg total) by mouth every 6 (six) hours as needed for moderate pain or severe pain.       Allergies:  Allergies  Allergen Reactions  . Iodine Hives  . Lisinopril Swelling    Swelling in the throat  . Penicillins Rash    Past Medical History, Surgical history, Social history, and Family History were reviewed and updated.  Review of Systems: Review of Systems  Constitutional: Negative.   HENT: Negative.   Eyes: Negative.   Respiratory: Negative.   Cardiovascular: Negative.   Gastrointestinal: Negative.   Genitourinary: Negative.   Musculoskeletal: Negative.   Skin: Negative.   Neurological: Negative.   Endo/Heme/Allergies: Negative.   Psychiatric/Behavioral: Negative.      Physical Exam:  weight is 212 lb (96.2 kg). Stephanie temporal temperature is 97.8 F (36.6 C). Stephanie blood pressure is 136/92 (abnormal) and Stephanie pulse is 85. Stephanie respiration is 20 and oxygen saturation is 99%.   Wt Readings from Last 3 Encounters:  07/02/19 212 lb (96.2 kg)  02/26/19 217 lb 8 oz (98.7 kg)  02/07/19  215 lb 2.7 oz (97.6 kg)    Physical Exam Vitals signs reviewed.  Constitutional:      Comments: Stephanie breast exam shows the right breast with the lumpectomy that is well-healed.  This is about the 10 o'clock position.  No obvious nodularity is noted in the right breast.  There is no right axillary adenopathy.  Left breast is unremarkable.  There is no left axillary adenopathy.  HENT:     Head: Normocephalic and atraumatic.  Eyes:     Pupils: Pupils are equal, round, and reactive to light.  Neck:     Musculoskeletal: Normal range of motion.  Cardiovascular:     Rate and Rhythm: Normal rate and regular rhythm.     Heart sounds: Normal heart sounds.  Pulmonary:     Effort: Pulmonary effort is normal.     Breath sounds: Normal breath sounds.   Abdominal:     General: Bowel sounds are normal.     Palpations: Abdomen is soft.  Musculoskeletal: Normal range of motion.        General: No tenderness or deformity.  Lymphadenopathy:     Cervical: No cervical adenopathy.  Skin:    General: Skin is warm and dry.     Findings: No erythema or rash.  Neurological:     Mental Status: She is alert and oriented to person, place, and time.  Psychiatric:        Behavior: Behavior normal.        Thought Content: Thought content normal.        Judgment: Judgment normal.      Lab Results  Component Value Date   WBC 5.3 07/02/2019   HGB 11.6 (L) 07/02/2019   HCT 35.7 (L) 07/02/2019   MCV 85.2 07/02/2019   PLT 167 07/02/2019   Lab Results  Component Value Date   FERRITIN 43 06/08/2016   IRON 60 06/08/2016   TIBC 310 06/08/2016   UIBC 250 06/08/2016   IRONPCTSAT 19 (L) 06/08/2016   Lab Results  Component Value Date   RBC 4.19 07/02/2019   No results found for: KPAFRELGTCHN, LAMBDASER, KAPLAMBRATIO No results found for: IGGSERUM, IGA, IGMSERUM No results found for: Odetta Pink, SPEI   Chemistry      Component Value Date/Time   NA 142 02/26/2019 0801   NA 147 (H) 06/08/2017 0748   NA 140 12/07/2016 0836   K 4.3 02/26/2019 0801   K 4.1 06/08/2017 0748   K 4.5 12/07/2016 0836   CL 107 02/26/2019 0801   CL 109 (H) 06/08/2017 0748   CO2 27 02/26/2019 0801   CO2 29 06/08/2017 0748   CO2 28 12/07/2016 0836   BUN 40 (H) 02/26/2019 0801   BUN 39 (H) 06/08/2017 0748   BUN 34.7 (H) 12/07/2016 0836   CREATININE 2.00 (H) 02/26/2019 0801   CREATININE 2.1 (H) 06/08/2017 0748   CREATININE 2.2 (H) 12/07/2016 0836      Component Value Date/Time   CALCIUM 9.8 02/26/2019 0801   CALCIUM 9.3 06/08/2017 0748   CALCIUM 9.3 12/07/2016 0836   ALKPHOS 73 02/26/2019 0801   ALKPHOS 69 06/08/2017 0748   ALKPHOS 73 12/07/2016 0836   AST 21 02/26/2019 0801   AST 23 12/07/2016 0836    ALT 19 02/26/2019 0801   ALT 27 06/08/2017 0748   ALT 21 12/07/2016 0836   BILITOT 0.5 02/26/2019 0801   BILITOT 0.55 12/07/2016 0836     Impression and Plan:  Stephanie Pierce is a 69 yo [place] American female with DCIS of the right breast diagnosed in July 2015 with lumpectomy.  She also had radiation therapy after the lobectomy.  I think this was done in [place].  She elected to forego any hormonal therapy.   We now have a new primaries.  Thankfully, this is not invasive carcinoma.  She is already had radiation therapy to the right breast so she would not be a candidate for any additional radiation therapy.  She is on Femara.  We will keep Stephanie on Femara for right now.  She will come back to see [place] in 4 months.  I think this would be reasonable.  I do worry a little bit about the creatinine.  It is on the higher side.  We will have to be cautious in the future with respect to Stephanie red cell count.        Volanda Napoleon, MD 11/4/20208:17 AM

## 2019-07-02 NOTE — Telephone Encounter (Signed)
Appointments scheduled patient declined calendar.  She has My Chart Access/ per 11/4 los

## 2019-07-03 MED FILL — LOSARTAN POTASSIUM 100 MG T: 100 | 90 days supply | Qty: 90 | Fill #1

## 2019-07-29 ENCOUNTER — Other Ambulatory Visit: Payer: Self-pay | Admitting: Hematology & Oncology

## 2019-07-29 MED FILL — LETROZOLE 2.5 MG TABS: 2.5 | 30 days supply | Qty: 30 | Fill #0

## 2019-09-04 DIAGNOSIS — Z20828 Contact with and (suspected) exposure to other viral communicable diseases: Secondary | ICD-10-CM | POA: Diagnosis not present

## 2019-09-12 ENCOUNTER — Encounter: Payer: Self-pay | Admitting: Family Medicine

## 2019-09-29 ENCOUNTER — Other Ambulatory Visit: Payer: Self-pay | Admitting: Family Medicine

## 2019-09-29 DIAGNOSIS — Z853 Personal history of malignant neoplasm of breast: Secondary | ICD-10-CM

## 2019-10-02 DIAGNOSIS — Z20822 Contact with and (suspected) exposure to covid-19: Secondary | ICD-10-CM | POA: Diagnosis not present

## 2019-10-13 ENCOUNTER — Encounter: Payer: Self-pay | Admitting: Family Medicine

## 2019-10-29 ENCOUNTER — Other Ambulatory Visit: Payer: Self-pay

## 2019-10-29 ENCOUNTER — Inpatient Hospital Stay: Payer: Medicare Other | Attending: Hematology & Oncology

## 2019-10-29 ENCOUNTER — Inpatient Hospital Stay (HOSPITAL_BASED_OUTPATIENT_CLINIC_OR_DEPARTMENT_OTHER): Payer: Medicare Other | Admitting: Hematology & Oncology

## 2019-10-29 ENCOUNTER — Encounter: Payer: Self-pay | Admitting: Hematology & Oncology

## 2019-10-29 VITALS — BP 136/86 | HR 71 | Temp 97.8°F | Resp 20 | Wt 206.8 lb

## 2019-10-29 DIAGNOSIS — D500 Iron deficiency anemia secondary to blood loss (chronic): Secondary | ICD-10-CM

## 2019-10-29 DIAGNOSIS — Z923 Personal history of irradiation: Secondary | ICD-10-CM | POA: Insufficient documentation

## 2019-10-29 DIAGNOSIS — Z79811 Long term (current) use of aromatase inhibitors: Secondary | ICD-10-CM | POA: Insufficient documentation

## 2019-10-29 DIAGNOSIS — D0510 Intraductal carcinoma in situ of unspecified breast: Secondary | ICD-10-CM

## 2019-10-29 DIAGNOSIS — D0511 Intraductal carcinoma in situ of right breast: Secondary | ICD-10-CM | POA: Insufficient documentation

## 2019-10-29 DIAGNOSIS — D649 Anemia, unspecified: Secondary | ICD-10-CM | POA: Diagnosis not present

## 2019-10-29 DIAGNOSIS — R918 Other nonspecific abnormal finding of lung field: Secondary | ICD-10-CM | POA: Insufficient documentation

## 2019-10-29 DIAGNOSIS — N289 Disorder of kidney and ureter, unspecified: Secondary | ICD-10-CM | POA: Diagnosis not present

## 2019-10-29 DIAGNOSIS — Z79899 Other long term (current) drug therapy: Secondary | ICD-10-CM | POA: Diagnosis not present

## 2019-10-29 DIAGNOSIS — Z170 Estrogen receptor positive status [ER+]: Secondary | ICD-10-CM | POA: Diagnosis not present

## 2019-10-29 DIAGNOSIS — E559 Vitamin D deficiency, unspecified: Secondary | ICD-10-CM

## 2019-10-29 LAB — CMP (CANCER CENTER ONLY)
ALT: 27 U/L (ref 0–44)
AST: 29 U/L (ref 15–41)
Albumin: 3.8 g/dL (ref 3.5–5.0)
Alkaline Phosphatase: 58 U/L (ref 38–126)
Anion gap: 5 (ref 5–15)
BUN: 42 mg/dL — ABNORMAL HIGH (ref 8–23)
CO2: 31 mmol/L (ref 22–32)
Calcium: 10.2 mg/dL (ref 8.9–10.3)
Chloride: 106 mmol/L (ref 98–111)
Creatinine: 2.25 mg/dL — ABNORMAL HIGH (ref 0.44–1.00)
GFR, Est AFR Am: 25 mL/min — ABNORMAL LOW (ref >60)
GFR, Estimated: 22 mL/min — ABNORMAL LOW (ref >60)
Glucose, Bld: 93 mg/dL (ref 70–99)
Potassium: 4.9 mmol/L (ref 3.5–5.1)
Sodium: 142 mmol/L (ref 135–145)
Total Bilirubin: 0.5 mg/dL (ref 0.3–1.2)
Total Protein: 7.6 g/dL (ref 6.5–8.1)

## 2019-10-29 LAB — CBC WITH DIFFERENTIAL (CANCER CENTER ONLY)
Abs Immature Granulocytes: 0.01 10*3/uL (ref 0.00–0.07)
Basophils Absolute: 0 10*3/uL (ref 0.0–0.1)
Basophils Relative: 1 %
Eosinophils Absolute: 0.2 10*3/uL (ref 0.0–0.5)
Eosinophils Relative: 4 %
HCT: 32.7 % — ABNORMAL LOW (ref 36.0–46.0)
Hemoglobin: 10.5 g/dL — ABNORMAL LOW (ref 12.0–15.0)
Immature Granulocytes: 0 %
Lymphocytes Relative: 36 %
Lymphs Abs: 1.6 10*3/uL (ref 0.7–4.0)
MCH: 27.1 pg (ref 26.0–34.0)
MCHC: 32.1 g/dL (ref 30.0–36.0)
MCV: 84.3 fL (ref 80.0–100.0)
Monocytes Absolute: 0.8 10*3/uL (ref 0.1–1.0)
Monocytes Relative: 19 %
Neutro Abs: 1.7 10*3/uL (ref 1.7–7.7)
Neutrophils Relative %: 40 %
Platelet Count: 170 10*3/uL (ref 150–400)
RBC: 3.88 MIL/uL (ref 3.87–5.11)
RDW: 13.6 % (ref 11.5–15.5)
WBC Count: 4.3 10*3/uL (ref 4.0–10.5)
nRBC: 0 % (ref 0.0–0.2)

## 2019-10-29 LAB — VITAMIN D 25 HYDROXY (VIT D DEFICIENCY, FRACTURES): Vit D, 25-Hydroxy: 56.24 ng/mL (ref 30–100)

## 2019-10-29 LAB — FERRITIN: Ferritin: 126 ng/mL (ref 11–307)

## 2019-10-29 LAB — IRON AND TIBC
Iron: 63 ug/dL (ref 41–142)
Saturation Ratios: 23 % (ref 21–57)
TIBC: 268 ug/dL (ref 236–444)
UIBC: 205 ug/dL (ref 120–384)

## 2019-10-29 NOTE — Progress Notes (Signed)
Hematology and Oncology Follow Up Visit  Stephanie Pierce [id number] 04-29-1951 69 y.o. 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 daily   Interim History: Stephanie Pierce is here today for follow-up.  She is doing okay.  Her left shoulder is little bit sore today.  She had her second coronavirus vaccine over the weekend.  She has done well.  She had no problems over the Christmas and New Year's holiday.  She has had no problems with hot flashes or sweats.  These seem to be getting a little bit better.  She has had no problems with bowels or bladder.  There is been no bleeding.  She has had no rashes.  There is been no leg swelling.  She had no cough or shortness of breath.  There is been no headache.    Overall, her performance status is ECOG 0.   Medications:  Allergies as of 10/29/2019      Reactions   Iodine Hives   Lisinopril Swelling   Swelling in the throat   Penicillins Rash      Medication List       Accurate as of October 29, 2019  8:49 AM. If you have any questions, ask your nurse or doctor.        STOP taking these medications   montelukast 10 MG tablet Commonly known as: SINGULAIR Stopped by: Volanda Napoleon, MD   traMADol 50 MG tablet Commonly known as: Ultram Stopped by: Volanda Napoleon, MD     TAKE these medications   B-12 2500 MCG Tabs Take 1 tablet by mouth once a week.   CENTRUM SILVER ADULT 50+ PO Take 1 tablet by mouth every other day.   fluticasone 50 MCG/ACT nasal spray Commonly known as: FLONASE Place 2 sprays into both nostrils daily.   KRILL OIL OMEGA-3 PO Take by mouth daily.   letrozole 2.5 MG tablet Commonly known as: FEMARA TAKE 1 TABLET BY MOUTH ONCE DAILY   losartan 100 MG tablet Commonly known as: COZAAR TAKE 1 TABLET (100 MG TOTAL) BY MOUTH DAILY.   OVER THE COUNTER MEDICATION Take 1 tablet by mouth  daily. CALCIUM 650MG  / VIT D3   Synthroid 112 MCG tablet Generic drug: levothyroxine Take 1 tablet (112 mcg total) by mouth daily before breakfast.       Allergies:  Allergies  Allergen Reactions  . Iodine Hives  . Lisinopril Swelling    Swelling in the throat  . Penicillins Rash    Past Medical History, Surgical history, Social history, and Family History were reviewed and updated.  Review of Systems: Review of Systems  Constitutional: Negative.   HENT: Negative.   Eyes: Negative.   Respiratory: Negative.   Cardiovascular: Negative.   Gastrointestinal: Negative.   Genitourinary: Negative.   Musculoskeletal: Negative.   Skin: Negative.   Neurological: Negative.   Endo/Heme/Allergies: Negative.   Psychiatric/Behavioral: Negative.      Physical Exam:  weight is 206 lb 12.8 oz (93.8 kg). Her temporal temperature is 97.8 F (36.6 C). Her blood pressure is 136/86 and her pulse is 71. Her respiration is 20 and oxygen saturation is 100%.   Wt Readings from Last 3 Encounters:  10/29/19 206 lb 12.8 oz (93.8 kg)  07/02/19 212 lb (96.2 kg)  02/26/19 217 lb 8 oz (98.7 kg)    Physical Exam Vitals reviewed.  Constitutional:  Comments: Her breast exam shows the right breast with the lumpectomy that is well-healed.  This is about the 10 o'clock position.  No obvious nodularity is noted in the right breast.  There is no right axillary adenopathy.  Left breast is unremarkable.  There is no left axillary adenopathy.  HENT:     Head: Normocephalic and atraumatic.  Eyes:     Pupils: Pupils are equal, round, and reactive to light.  Cardiovascular:     Rate and Rhythm: Normal rate and regular rhythm.     Heart sounds: Normal heart sounds.  Pulmonary:     Effort: Pulmonary effort is normal.     Breath sounds: Normal breath sounds.  Abdominal:     General: Bowel sounds are normal.     Palpations: Abdomen is soft.  Musculoskeletal:        General: No tenderness or  deformity. Normal range of motion.     Cervical back: Normal range of motion.  Lymphadenopathy:     Cervical: No cervical adenopathy.  Skin:    General: Skin is warm and dry.     Findings: No erythema or rash.  Neurological:     Mental Status: She is alert and oriented to person, place, and time.  Psychiatric:        Behavior: Behavior normal.        Thought Content: Thought content normal.        Judgment: Judgment normal.      Lab Results  Component Value Date   WBC 4.3 10/29/2019   HGB 10.5 (L) 10/29/2019   HCT 32.7 (L) 10/29/2019   MCV 84.3 10/29/2019   PLT 170 10/29/2019   Lab Results  Component Value Date   FERRITIN 43 06/08/2016   IRON 60 06/08/2016   TIBC 310 06/08/2016   UIBC 250 06/08/2016   IRONPCTSAT 19 (L) 06/08/2016   Lab Results  Component Value Date   RBC 3.88 10/29/2019   No results found for: KPAFRELGTCHN, LAMBDASER, KAPLAMBRATIO No results found for: IGGSERUM, IGA, IGMSERUM No results found for: Odetta Pink, SPEI   Chemistry      Component Value Date/Time   NA 141 07/02/2019 0749   NA 147 (H) 06/08/2017 0748   NA 140 12/07/2016 0836   K 4.0 07/02/2019 0749   K 4.1 06/08/2017 0748   K 4.5 12/07/2016 0836   CL 106 07/02/2019 0749   CL 109 (H) 06/08/2017 0748   CO2 28 07/02/2019 0749   CO2 29 06/08/2017 0748   CO2 28 12/07/2016 0836   BUN 41 (H) 07/02/2019 0749   BUN 39 (H) 06/08/2017 0748   BUN 34.7 (H) 12/07/2016 0836   CREATININE 2.36 (H) 07/02/2019 0749   CREATININE 2.00 (H) 02/26/2019 0801   CREATININE 2.1 (H) 06/08/2017 0748   CREATININE 2.2 (H) 12/07/2016 0836      Component Value Date/Time   CALCIUM 9.8 07/02/2019 0749   CALCIUM 9.3 06/08/2017 0748   CALCIUM 9.3 12/07/2016 0836   ALKPHOS 64 07/02/2019 0749   ALKPHOS 69 06/08/2017 0748   ALKPHOS 73 12/07/2016 0836   AST 24 07/02/2019 0749   AST 21 02/26/2019 0801   AST 23 12/07/2016 0836   ALT 21 07/02/2019 0749   ALT  19 02/26/2019 0801   ALT 27 06/08/2017 0748   ALT 21 12/07/2016 0836   BILITOT 0.6 07/02/2019 0749   BILITOT 0.5 02/26/2019 0801   BILITOT 0.55 12/07/2016 0836  Impression and Plan: Stephanie Pierce is a 69 yo [place] American female with DCIS of the right breast diagnosed in July 2015 with lumpectomy.  She also had radiation therapy after the lobectomy.  I think this was done in [place].  She elected to forego any hormonal therapy.   We now have a new primary malignancy.  Thankfully, DCIS is not invasive carcinoma.  She is already had radiation therapy to the right breast so she would not be a candidate for any additional radiation therapy.  She is on Femara.  We will keep her on Femara for right now.  I am little bit worried about her anemia.  I know she has renal insufficiency.  We are checking an erythropoietin level on her today.  I am also checking iron levels.  She will come back to see [place] in 4 months.  I think this would be reasonable.         Volanda Napoleon, MD 3/3/20218:49 AM

## 2019-10-30 ENCOUNTER — Encounter: Payer: Self-pay | Admitting: Hematology & Oncology

## 2019-10-30 ENCOUNTER — Other Ambulatory Visit: Payer: Medicare Other

## 2019-10-30 ENCOUNTER — Ambulatory Visit: Payer: Medicare Other | Admitting: Hematology & Oncology

## 2019-10-30 LAB — ERYTHROPOIETIN: Erythropoietin: 14.3 m[IU]/mL (ref 2.6–18.5)

## 2019-11-03 ENCOUNTER — Other Ambulatory Visit: Payer: Self-pay | Admitting: Family Medicine

## 2019-11-03 MED FILL — LETROZOLE 2.5 MG TABS: 2.5 | 30 days supply | Qty: 30 | Fill #1

## 2019-11-04 MED FILL — FLUTICASONE PROP 50 MCG SPR: 50 | 30 days supply | Qty: 16 | Fill #0

## 2019-11-14 ENCOUNTER — Telehealth: Payer: Self-pay | Admitting: Family Medicine

## 2019-11-17 ENCOUNTER — Encounter: Payer: Self-pay | Admitting: Family Medicine

## 2019-11-17 ENCOUNTER — Ambulatory Visit (INDEPENDENT_AMBULATORY_CARE_PROVIDER_SITE_OTHER): Payer: Medicare Other | Admitting: Family Medicine

## 2019-11-17 ENCOUNTER — Other Ambulatory Visit: Payer: Self-pay

## 2019-11-17 DIAGNOSIS — E559 Vitamin D deficiency, unspecified: Secondary | ICD-10-CM | POA: Diagnosis not present

## 2019-11-17 DIAGNOSIS — E782 Mixed hyperlipidemia: Secondary | ICD-10-CM

## 2019-11-17 DIAGNOSIS — E039 Hypothyroidism, unspecified: Secondary | ICD-10-CM | POA: Diagnosis not present

## 2019-11-17 DIAGNOSIS — Z7189 Other specified counseling: Secondary | ICD-10-CM | POA: Diagnosis not present

## 2019-11-17 DIAGNOSIS — E538 Deficiency of other specified B group vitamins: Secondary | ICD-10-CM | POA: Diagnosis not present

## 2019-11-17 DIAGNOSIS — T7840XD Allergy, unspecified, subsequent encounter: Secondary | ICD-10-CM | POA: Diagnosis not present

## 2019-11-17 DIAGNOSIS — D0510 Intraductal carcinoma in situ of unspecified breast: Secondary | ICD-10-CM

## 2019-11-17 DIAGNOSIS — Z0000 Encounter for general adult medical examination without abnormal findings: Secondary | ICD-10-CM

## 2019-11-17 DIAGNOSIS — N289 Disorder of kidney and ureter, unspecified: Secondary | ICD-10-CM

## 2019-11-17 MED ORDER — FLUTICASONE PROPIONATE 50 MCG/ACT NA SUSP: 2.0000 | Freq: Every day | NASAL | 5 refills | Status: DC

## 2019-11-17 MED ORDER — SYNTHROID 112 MCG PO TABS: 112.0000 ug | ORAL_TABLET | Freq: Every day | ORAL | 3 refills | Status: DC

## 2019-11-17 MED ORDER — LOSARTAN POTASSIUM 100 MG PO TABS: 100.0000 mg | ORAL_TABLET | Freq: Every day | ORAL | 3 refills | Status: DC

## 2019-11-17 NOTE — Assessment & Plan Note (Addendum)
Patient encouraged to maintain heart healthy diet, regular exercise, adequate sleep. Consider daily probiotics. Take medications as prescribed. Labs ordered and reviewed. MGM soon, pap in August. Dexa normal in 2019 repeat in 2022 or 2023.

## 2019-11-17 NOTE — Assessment & Plan Note (Signed)
Following with oncology and has a mgm scheduled soon. Doing well

## 2019-11-17 NOTE — Assessment & Plan Note (Signed)
On Levothyroxine, continue to monitor 

## 2019-11-17 NOTE — Progress Notes (Addendum)
Virtual Visit via Video Note  I connected with Stephanie Pierce on 11/17/19 at  9:00 AM EDT by a video enabled telemedicine application and verified that I am speaking with the correct person using two identifiers.  Location: Patient: home Provider: home   I discussed the limitations of evaluation and management by telemedicine and the availability of in person appointments. The patient expressed understanding and agreed to proceed.  Magdalene Molly, CMA was able to get the patient set up on a visit,video   Subjective:    Patient ID: Stephanie Pierce, female    DOB: 1951/03/17, 69 y.o.   MRN: [id number]  Chief Complaint  Patient presents with  . Follow-up    HPI Patient is in today for follow up on her chronic health concerns and review of her current health status. She continues to work in Corporate treasurer. She notes her husband had COVID and she had symptos after him and then she had a notable reaction to her first shot with fevers, fatigue and more. She had negative covid testing but she still questions if she had the illness. Otherwise she has been well. She has noted a recent flare in allergy symptoms with some congestion in the right nares and some popping and pressure in her right ear. No fevers or chills. Does have an occasional cough from the PND. She is staying active and eating well. Had gained some weight but has now lost it again. Denies CP/palp/SOB/HA/fevers/GI or GU c/o. Taking meds as prescribed  Past Medical History:  Diagnosis Date  . Allergic state 03/23/2015  . Allergy   . Breast fibrocystic disorder   . Cancer ([place])   . DCIS (ductal carcinoma in situ) of breast   . 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 Enlarged thyroid gland   . H/O iron deficiency anemia 03/23/2015  . History of chicken pox 03/23/2015  . Hyperlipidemia, mixed 03/23/2015  . Hypothyroidism 03/23/2015  . Overweight 03/23/2015  . Personal history of radiation therapy   .  Pneumonia 2012  . Renal insufficiency 03/23/2015  . S/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  . Vitamin D deficiency 05/12/2015    Past Surgical History:  Procedure Laterality Date  . BREAST LUMPECTOMY     h/o 2 needle biopsy, in 2015 right Lumpectomy DCIS stage 0  . BREAST LUMPECTOMY WITH RADIOACTIVE SEED LOCALIZATION Right 02/07/2019   Procedure: RIGHT BREAST LUMPECTOMY WITH RADIOACTIVE SEED LOCALIZATION;  Surgeon: Alphonsa Overall, MD;  Location: [place];  Service: General;  Laterality: Right;  . CESAREAN SECTION  1985  . LAPAROSCOPIC GASTRIC SLEEVE RESECTION      Family History  Problem Relation Age of Onset  . Hypertension Father   . Heart attack Father   . Alcohol abuse Father   . Diabetes Brother   . Other Brother        complications of exposure to agent orange  . Stroke Brother   . Diverticulosis Daughter   . Diabetes Sister   . Hypertension Sister   . Cancer Sister        metastatic breast cancer    Social History   Socioeconomic History  . Marital status: Married    Spouse name: Not on file  . Number of children: Not on file  . Years of education: 15  . Highest education level: Not on file  Occupational History  . Occupation: Chief Strategy Officer  Tobacco Use  . Smoking status:  Never Smoker  . Smokeless tobacco: Never Used  Substance and Sexual Activity  . Alcohol use: No    Alcohol/week: 0.0 standard drinks  . Drug use: No  . 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 Not on file  Social History Narrative  . Not on file   Social Determinants of Health   Financial Resource Strain:   . Difficulty of Paying Living Expenses:   Food Insecurity:   . Worried About Charity fundraiser in the Last Year:   . Arboriculturist in the Last Year:   Transportation Needs:   . Film/video editor (Medical):   Marland Kitchen  Lack of Transportation (Non-Medical):   Physical Activity:   . Days of Exercise per Week:   . Minutes of Exercise per Session:   Stress:   . Feeling of Stress :   Social Connections:   . Frequency of Communication with Friends and Family:   . Frequency of Social Gatherings with Friends and Family:   . Attends Religious Services:   . Active Member of Clubs or Organizations:   . Attends Archivist Meetings:   Marland Kitchen Marital Status:   Intimate Partner Violence:   . Fear of Current or Ex-Partner:   . Emotionally Abused:   Marland Kitchen Physically Abused:   . Sexually Abused:     Outpatient Medications Prior to Visit  Medication Sig Dispense Refill  . Cyanocobalamin (B-12) 2500 MCG TABS Take 1 tablet by mouth once a week. 12 tablet 5  . KRILL OIL OMEGA-3 PO Take by mouth daily.     Marland Kitchen letrozole (FEMARA) 2.5 MG tablet TAKE 1 TABLET BY MOUTH ONCE DAILY 30 tablet 6  . Multiple Vitamins-Minerals (CENTRUM SILVER ADULT 50+ PO) Take 1 tablet by mouth every other day.    Marland Kitchen OVER THE COUNTER MEDICATION Take 1 tablet by mouth daily. CALCIUM 650MG / VIT D3    . fluticasone (FLONASE) 50 MCG/ACT nasal spray PLACE 2 SPRAYS INTO BOTH NOSTRILS DAILY. 16 g 2  . losartan (COZAAR) 100 MG tablet TAKE 1 TABLET (100 MG TOTAL) BY MOUTH DAILY. 90 tablet 2  . SYNTHROID 112 MCG tablet Take 1 tablet (112 mcg total) by mouth daily before breakfast. 90 tablet 4   No facility-administered medications prior to visit.    Allergies  Allergen Reactions  . Iodine Hives  . Lisinopril Swelling    Swelling in the throat  . Penicillins Rash    Review of Systems  Constitutional: Negative for chills, fever and malaise/fatigue.  HENT: Positive for congestion and ear pain. Negative for ear discharge, hearing loss, nosebleeds and tinnitus.   Eyes: Negative for discharge.  Respiratory: Positive for cough and sputum production. Negative for hemoptysis and shortness of breath.   Cardiovascular: Negative for chest pain,  palpitations and leg swelling.  Gastrointestinal: Negative for abdominal pain, blood in stool, constipation, diarrhea, heartburn, nausea and vomiting.  Genitourinary: Negative for dysuria, frequency, hematuria and urgency.  Musculoskeletal: Negative for back pain, falls and myalgias.  Skin: Negative for rash.  Neurological: Negative for dizziness, sensory change, loss of consciousness, weakness and headaches.  Endo/Heme/Allergies: Negative for environmental allergies. Does not bruise/bleed easily.  Psychiatric/Behavioral: Negative for depression and suicidal ideas. The patient is not nervous/anxious and does not have insomnia.        Objective:    Physical  Exam Constitutional:      Appearance: Normal appearance. She is not ill-appearing.  HENT:     Head: Normocephalic and atraumatic.     Right Ear: External ear normal.     Left Ear: External ear normal.     Nose: Nose normal.  Eyes:     General:        Right eye: No discharge.        Left eye: No discharge.  Pulmonary:     Effort: Pulmonary effort is normal.  Musculoskeletal:        General: Normal range of motion.  Skin:    General: Skin is dry.  Neurological:     Mental Status: She is alert and oriented to person, place, and time. Mental status is at baseline.  Psychiatric:        Behavior: Behavior normal.     BP 120/70 (BP Location: Left Arm, Patient Position: Sitting, Cuff Size: Normal)   Wt 196 lb 11.2 oz (89.2 kg)   BMI 30.81 kg/m  Wt Readings from Last 3 Encounters:  11/17/19 196 lb 11.2 oz (89.2 kg)  10/29/19 206 lb 12.8 oz (93.8 kg)  07/02/19 212 lb (96.2 kg)    Diabetic Foot Exam - Simple   No data filed     Lab Results  Component Value Date   WBC 4.3 10/29/2019   HGB 10.5 (L) 10/29/2019   HCT 32.7 (L) 10/29/2019   PLT 170 10/29/2019   GLUCOSE 93 10/29/2019   CHOL 229 (H) 11/08/2018   TRIG 39.0 11/08/2018   HDL 109.10 11/08/2018   LDLCALC 113 (H) 11/08/2018   ALT 27 10/29/2019   AST 29  10/29/2019   NA 142 10/29/2019   K 4.9 10/29/2019   CL 106 10/29/2019   CREATININE 2.25 (H) 10/29/2019   BUN 42 (H) 10/29/2019   CO2 31 10/29/2019   TSH 0.11 (L) 11/08/2018    Lab Results  Component Value Date   TSH 0.11 (L) 11/08/2018   Lab Results  Component Value Date   WBC 4.3 10/29/2019   HGB 10.5 (L) 10/29/2019   HCT 32.7 (L) 10/29/2019   MCV 84.3 10/29/2019   PLT 170 10/29/2019   Lab Results  Component Value Date   NA 142 10/29/2019   K 4.9 10/29/2019   CHLORIDE 105 12/07/2016   CO2 31 10/29/2019   GLUCOSE 93 10/29/2019   BUN 42 (H) 10/29/2019   CREATININE 2.25 (H) 10/29/2019   BILITOT 0.5 10/29/2019   ALKPHOS 58 10/29/2019   AST 29 10/29/2019   ALT 27 10/29/2019   PROT 7.6 10/29/2019   ALBUMIN 3.8 10/29/2019   CALCIUM 10.2 10/29/2019   ANIONGAP 5 10/29/2019   EGFR 26 (L) 12/07/2016   GFR 30.81 (L) 11/08/2018   Lab Results  Component Value Date   CHOL 229 (H) 11/08/2018   Lab Results  Component Value Date   HDL 109.10 11/08/2018   Lab Results  Component Value Date   LDLCALC 113 (H) 11/08/2018   Lab Results  Component Value Date   TRIG 39.0 11/08/2018   Lab Results  Component Value Date   CHOLHDL 2 11/08/2018   No results found for: HGBA1C     Assessment & Plan:   Problem List Items Addressed This Visit    Vitamin D deficiency (Chronic)    Supplement and  monitor      Hyperlipidemia, mixed    Encouraged heart healthy diet, increase exercise, avoid trans fats, consider a krill oil  cap daily      Relevant Medications   losartan (COZAAR) 100 MG tablet   Renal insufficiency    Hydrate and monitor      Allergy    A small flare with spring pollen. She notes mask wearing helps. She has some congestion on right. Encouraged to continue Flonase and add nasal saline. Report if worsens      DCIS (ductal carcinoma in situ) of breast    Following with oncology and has a mgm scheduled soon. Doing well      Hypothyroidism    On  Levothyroxine, continue to monitor      Relevant Medications   SYNTHROID 112 MCG tablet   Preventative health care    Patient encouraged to maintain heart healthy diet, regular exercise, adequate sleep. Consider daily probiotics. Take medications as prescribed. Labs ordered and reviewed. MGM soon, pap in August. Dexa normal in 2019 repeat in 2022 or 2023.       Vitamin B12 deficiency    Supplement and monitor      Educated about COVID-19 virus infection    She has had her covid shot and had a bad reaction to the first shot. She questions if she had covid as she got very sick after her husband had covid but her testing was negative. She is feeling better now.  Omron Blood Pressure cuff, upper arm, want BP 100-140/60-90 Pulse oximeter, want oxygen in 90s  Weekly vitals  Take Multivitamin with minerals, selenium Vitamin D 1000-2000 IU daily Probiotic with lactobacillus and bifidophilus Asprin EC 81 mg daily  Melatonin 2-5 mg at bedtime  [email or web address]         I am having Katilyn A. Oakley maintain her Multiple Vitamins-Minerals (CENTRUM SILVER ADULT 50+ PO), KRILL OIL OMEGA-3 PO, B-12, OVER THE COUNTER MEDICATION, letrozole, losartan, fluticasone, and Synthroid.  Meds ordered this encounter  Medications  . losartan (COZAAR) 100 MG tablet    Sig: Take 1 tablet (100 mg total) by mouth daily.    Dispense:  90 tablet    Refill:  3  . fluticasone (FLONASE) 50 MCG/ACT nasal spray    Sig: Place 2 sprays into both nostrils daily.    Dispense:  16 g    Refill:  5  . SYNTHROID 112 MCG tablet    Sig: Take 1 tablet (112 mcg total) by mouth daily before breakfast.    Dispense:  90 tablet    Refill:  3     I discussed the assessment and treatment plan with the patient. The patient was provided an opportunity to ask questions and all were answered. The patient agreed with the plan and demonstrated an understanding of the instructions.   The  patient was advised to call back or seek an in-person evaluation if the symptoms worsen or if the condition fails to improve as anticipated.  I provided 45 minutes of non-face-to-face time during this encounter.   Penni Homans, MD

## 2019-11-17 NOTE — Assessment & Plan Note (Signed)
Supplement and monitor 

## 2019-11-17 NOTE — Assessment & Plan Note (Signed)
Hydrate and monitor 

## 2019-11-17 NOTE — Assessment & Plan Note (Signed)
A small flare with spring pollen. She notes mask wearing helps. She has some congestion on right. Encouraged to continue Flonase and add nasal saline. Report if worsens

## 2019-11-17 NOTE — Assessment & Plan Note (Signed)
She has had her covid shot and had a bad reaction to the first shot. She questions if she had covid as she got very sick after her husband had covid but her testing was negative. She is feeling better now.  Omron Blood Pressure cuff, upper arm, want BP 100-140/60-90 Pulse oximeter, want oxygen in 90s  Weekly vitals  Take Multivitamin with minerals, selenium Vitamin D 1000-2000 IU daily Probiotic with lactobacillus and bifidophilus Asprin EC 81 mg daily  Melatonin 2-5 mg at bedtime  [email or web address]

## 2019-11-17 NOTE — Assessment & Plan Note (Signed)
Encouraged heart healthy diet, increase exercise, avoid trans fats, consider a krill oil cap daily 

## 2019-11-17 NOTE — Patient Instructions (Signed)
Omron Blood Pressure cuff, upper arm, want BP 100-140/60-90 Pulse oximeter, want oxygen in 90s  Weekly vitals  Take Multivitamin with minerals, selenium Vitamin D 1000-2000 IU daily Probiotic with lactobacillus and bifidophilus Asprin EC 81 mg daily Fish or krill oil daily Melatonin 2-5 mg at bedtime  [email or web address]

## 2019-11-18 ENCOUNTER — Other Ambulatory Visit: Payer: Medicare Other

## 2019-11-20 NOTE — Progress Notes (Signed)
Nurse connected with patient 11/21/19 at  8:45 AM EDT by a telephone enabled telemedicine application and verified that I am speaking with the correct person using two identifiers. Patient stated full name and DOB. Patient gave permission to continue with virtual visit. Patient's location was at home and Nurse's location was at [place] office.   Subjective:   Stephanie Pierce is a 69 y.o. female who presents for Medicare Annual (Subsequent) preventive examination.  Pt is Therapist, sports. Works full time Oncologist.  Review of Systems:   Home Safety/Smoke Alarms: Feels safe in home. Smoke alarms in place.  Lives w/ husband in 3 level home.   Female:       Mammo- scheduled 12/12/19      Dexa scan-11/09/17        CCS- pt states Cologuard was order on Monday     Objective:     Vitals: Unable to assess. This visit is enabled though telemedicine due to Covid 19.   Advanced Directives 11/21/2019 10/29/2019 07/02/2019 02/26/2019 02/07/2019 01/30/2019 11/29/2018  Does Patient Have a Medical Advance Directive? No No Yes Yes No No No  Type of Advance Directive - - Living will - - - -  Does patient want to make changes to medical advance directive? No - Patient declined - - No - Patient declined - - -  Would patient like information on creating a medical advance directive? - No - Patient declined No - Patient declined - No - Patient declined - No - Patient declined    Tobacco Social History   Tobacco Use  Smoking Status Never Smoker  Smokeless Tobacco Never Used     Counseling given: Not Answered   Clinical Intake: Pain : No/denies pain      Past Medical History:  Diagnosis Date  . Allergic state 03/23/2015  . Allergy   . Breast fibrocystic disorder   . Cancer ([place])   . DCIS (ductal carcinoma in situ) of breast   . 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 Enlarged thyroid gland   . H/O iron deficiency anemia 03/23/2015  . History of chicken pox 03/23/2015  .  Hyperlipidemia, mixed 03/23/2015  . Hypothyroidism 03/23/2015  . Overweight 03/23/2015  . Personal history of radiation therapy   . Pneumonia 2012  . Renal insufficiency 03/23/2015  . S/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  . Vitamin D deficiency 05/12/2015   Past Surgical History:  Procedure Laterality Date  . BREAST LUMPECTOMY     h/o 2 needle biopsy, in 2015 right Lumpectomy DCIS stage 0  . BREAST LUMPECTOMY WITH RADIOACTIVE SEED LOCALIZATION Right 02/07/2019   Procedure: RIGHT BREAST LUMPECTOMY WITH RADIOACTIVE SEED LOCALIZATION;  Surgeon: Alphonsa Overall, MD;  Location: [place];  Service: General;  Laterality: Right;  . CESAREAN SECTION  1985  . LAPAROSCOPIC GASTRIC SLEEVE RESECTION     Family History  Problem Relation Age of Onset  . Hypertension Father   . Heart attack Father   . Alcohol abuse Father   . Diabetes Brother   . Other Brother        complications of exposure to agent orange  . Stroke Brother   . Diverticulosis Daughter   . Diabetes Sister   . Hypertension Sister   . Cancer Sister        metastatic breast cancer   Social History   Socioeconomic History  . Marital status: Married    Spouse name: Not  on file  . Number of children: Not on file  . Years of education: 64  . Highest education level: Not on file  Occupational History  . Occupation: Chief Strategy Officer  Tobacco Use  . Smoking status: Never Smoker  . Smokeless tobacco: Never Used  Substance and Sexual Activity  . Alcohol use: No    Alcohol/week: 0.0 standard drinks  . Drug use: No  . 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 Not on file  Social History Narrative  . Not on file   Social Determinants of Health   Financial Resource Strain: Low Risk   . Difficulty of Paying Living Expenses: Not hard at all  Food Insecurity: No Food  Insecurity  . Worried About Charity fundraiser in the Last Year: Never true  . Ran Out of Food in the Last Year: Never true  Transportation Needs: No Transportation Needs  . Lack of Transportation (Medical): No  . Lack of Transportation (Non-Medical): No  Physical Activity:   . Days of Exercise per Week:   . Minutes of Exercise per Session:   Stress:   . Feeling of Stress :   Social Connections:   . Frequency of Communication with Friends and Family:   . Frequency of Social Gatherings with Friends and Family:   . Attends Religious Services:   . Active Member of Clubs or Organizations:   . Attends Archivist Meetings:   Marland Kitchen Marital Status:     Outpatient Encounter Medications as of 11/21/2019  Medication Sig  . Cyanocobalamin (B-12) 2500 MCG TABS Take 1 tablet by mouth once a week.  . fluticasone (FLONASE) 50 MCG/ACT nasal spray Place 2 sprays into both nostrils daily.  Marland Kitchen KRILL OIL OMEGA-3 PO Take by mouth daily.   Marland Kitchen letrozole (FEMARA) 2.5 MG tablet TAKE 1 TABLET BY MOUTH ONCE DAILY  . losartan (COZAAR) 100 MG tablet Take 1 tablet (100 mg total) by mouth daily.  . Multiple Vitamins-Minerals (CENTRUM SILVER ADULT 50+ PO) Take 1 tablet by mouth every other day.  Marland Kitchen OVER THE COUNTER MEDICATION Take 1 tablet by mouth daily. CALCIUM 650MG  / VIT D3  . SYNTHROID 112 MCG tablet Take 1 tablet (112 mcg total) by mouth daily before breakfast.   No facility-administered encounter medications on file as of 11/21/2019.    Activities of Daily Living In your present state of health, do you have any difficulty performing the following activities: 11/21/2019 02/07/2019  Hearing? N N  Vision? N N  Difficulty concentrating or making decisions? N N  Walking or climbing stairs? N N  Dressing or bathing? N N  Doing errands, shopping? N -  Preparing Food and eating ? N -  Using the Toilet? N -  In the past six months, have you accidently leaked urine? N -  Do you have problems with loss of  bowel control? N -  Managing your Medications? N -  Managing your Finances? N -  Housekeeping or managing your Housekeeping? N -  Some recent data might be hidden    Patient Care Team: Mosie Lukes, MD as PCP - General (Family Medicine) Marin Olp Rudell Cobb, MD as Consulting Physician (Oncology)    Assessment:   This is a routine wellness examination for Lori. Physical assessment deferred to PCP.  Exercise Activities and Dietary recommendations Current Exercise  Habits: Home exercise routine, Type of exercise: walking(at least 10,000 steps per day), Frequency (Times/Week): 7, Intensity: Mild, Exercise limited by: None identified Diet (meal preparation, eat out, water intake, caffeinated beverages, dairy products, fruits and vegetables): in general, a "healthy" diet  , well balanced   Goals    . Maintain healthy active lifestyle.       Fall Risk Fall Risk  11/21/2019 10/05/2017 06/08/2016 03/02/2016 01/26/2016  Falls in the past year? 0 No No No No  Number falls in past yr: 0 - - - -  Injury with Fall? 0 - - - -  Follow up Education provided;Falls prevention discussed - - - -   Depression Screen PHQ 2/9 Scores 11/21/2019 10/05/2017 04/08/2015 04/05/2015  PHQ - 2 Score 0 0 0 0     Cognitive Function   Ad8 score reviewed for issues:  Issues making decisions:no  Less interest in hobbies / activities:no  Repeats questions, stories (family complaining):no  Trouble using ordinary gadgets (microwave, computer, phone):no  Forgets the month or year: no  Mismanaging finances: no  Remembering appts:no Daily problems with thinking and/or memory:no Ad8 score is=0       Immunization History  Administered Date(s) Administered  . Influenza,inj,Quad PF,6+ Mos 06/08/2016  . Influenza-Unspecified 05/28/2014, 05/29/2015, 06/08/2016, 06/15/2017, 05/04/2018, 05/10/2019  . PFIZER SARS-COV-2 Vaccination 10/04/2019  . Pneumococcal Conjugate-13 02/03/2016  . Pneumococcal Polysaccharide-23  10/05/2017  . Pneumococcal-Unspecified 08/28/2010  . Tdap 02/03/2016  . Zoster 03/23/2015   Screening Tests Health Maintenance  Topic Date Due  . MAMMOGRAM  11/19/2020  . COLONOSCOPY  05/30/2025  . TETANUS/TDAP  02/02/2026  . INFLUENZA VACCINE  Completed  . DEXA SCAN  Completed  . Hepatitis C Screening  Completed  . PNA vac Low Risk Adult  Completed      Plan:    Please schedule your next medicare wellness visit with me in 1 yr.  Continue to eat heart healthy diet (full of fruits, vegetables, whole grains, lean protein, water--limit salt, fat, and sugar intake) and increase physical activity as tolerated.  Continue doing brain stimulating activities (puzzles, reading, adult coloring books, staying active) to keep memory sharp.   Bring a copy of your living will and/or healthcare power of attorney to your next office visit.    I have personally reviewed and noted the following in the patient's chart:   . Medical and social history . Use of alcohol, tobacco or illicit drugs  . Current medications and supplements . Functional ability and status . Nutritional status . Physical activity . Advanced directives . List of other physicians . Hospitalizations, surgeries, and ER visits in previous 12 months . Vitals . Screenings to include cognitive, depression, and falls . Referrals and appointments  In addition, I have reviewed and discussed with patient certain preventive protocols, quality metrics, and best practice recommendations. A written personalized care plan for preventive services as well as general preventive health recommendations were provided to patient.     Naaman Plummer [place], [place]  11/21/2019

## 2019-11-21 ENCOUNTER — Other Ambulatory Visit (INDEPENDENT_AMBULATORY_CARE_PROVIDER_SITE_OTHER): Payer: Medicare Other

## 2019-11-21 ENCOUNTER — Other Ambulatory Visit: Payer: Medicare Other

## 2019-11-21 ENCOUNTER — Other Ambulatory Visit: Payer: Self-pay | Admitting: *Deleted

## 2019-11-21 ENCOUNTER — Other Ambulatory Visit: Payer: Self-pay

## 2019-11-21 ENCOUNTER — Telehealth: Payer: Self-pay | Admitting: Family Medicine

## 2019-11-21 ENCOUNTER — Encounter: Payer: Self-pay | Admitting: *Deleted

## 2019-11-21 ENCOUNTER — Ambulatory Visit (INDEPENDENT_AMBULATORY_CARE_PROVIDER_SITE_OTHER): Payer: Medicare Other | Admitting: *Deleted

## 2019-11-21 DIAGNOSIS — Z0000 Encounter for general adult medical examination without abnormal findings: Secondary | ICD-10-CM

## 2019-11-21 DIAGNOSIS — E039 Hypothyroidism, unspecified: Secondary | ICD-10-CM

## 2019-11-21 DIAGNOSIS — E782 Mixed hyperlipidemia: Secondary | ICD-10-CM

## 2019-11-21 LAB — CBC
HCT: 32.9 % — ABNORMAL LOW (ref 36.0–46.0)
Hemoglobin: 11.1 g/dL — ABNORMAL LOW (ref 12.0–15.0)
MCHC: 33.7 g/dL (ref 30.0–36.0)
MCV: 82.5 fl (ref 78.0–100.0)
Platelets: 158 10*3/uL (ref 150.0–400.0)
RBC: 3.99 Mil/uL (ref 3.87–5.11)
RDW: 15.2 % (ref 11.5–15.5)
WBC: 4.7 10*3/uL (ref 4.0–10.5)

## 2019-11-21 LAB — COMPREHENSIVE METABOLIC PANEL
ALT: 21 U/L (ref 0–35)
AST: 23 U/L (ref 0–37)
Albumin: 4 g/dL (ref 3.5–5.2)
Alkaline Phosphatase: 63 U/L (ref 39–117)
BUN: 41 mg/dL — ABNORMAL HIGH (ref 6–23)
CO2: 28 mEq/L (ref 19–32)
Calcium: 9.8 mg/dL (ref 8.4–10.5)
Chloride: 103 mEq/L (ref 96–112)
Creatinine, Ser: 2.08 mg/dL — ABNORMAL HIGH (ref 0.40–1.20)
GFR: 28.68 mL/min — ABNORMAL LOW (ref >60.00)
Glucose, Bld: 92 mg/dL (ref 70–99)
Potassium: 4 mEq/L (ref 3.5–5.1)
Sodium: 139 mEq/L (ref 135–145)
Total Bilirubin: 0.7 mg/dL (ref 0.2–1.2)
Total Protein: 7.4 g/dL (ref 6.0–8.3)

## 2019-11-21 LAB — LIPID PANEL
Cholesterol: 222 mg/dL — ABNORMAL HIGH (ref 0–200)
HDL: 93.9 mg/dL (ref >39.00)
LDL Cholesterol: 120 mg/dL — ABNORMAL HIGH (ref 0–99)
NonHDL: 128.29
Total CHOL/HDL Ratio: 2
Triglycerides: 42 mg/dL (ref 0.0–149.0)
VLDL: 8.4 mg/dL (ref 0.0–40.0)

## 2019-11-21 LAB — TSH: TSH: 0.02 u[IU]/mL — ABNORMAL LOW (ref 0.35–4.50)

## 2019-11-21 MED ORDER — SYNTHROID 100 MCG PO TABS: 100.0000 ug | ORAL_TABLET | Freq: Every day | ORAL | 1 refills | Status: DC

## 2019-11-21 MED FILL — SYNTHROID 100 MCG TABLET: 100 | 30 days supply | Qty: 30 | Fill #0

## 2019-11-21 NOTE — Telephone Encounter (Signed)
Pt states that Dr Charlett Blake would like her to decrease her SYNTHROID 112 MCG tablet to 100 b/c of her labs. Patient would like a prescriptions sent to [facility] down stairs.

## 2019-11-21 NOTE — Telephone Encounter (Signed)
Rx sent in and patient notified.  

## 2019-11-21 NOTE — Addendum Note (Signed)
Addended by: Kem Boroughs D on: 11/21/2019 02:24 PM   Modules accepted: Orders

## 2019-11-21 NOTE — Patient Instructions (Signed)
Please schedule your next medicare wellness visit with me in 1 yr.  Continue to eat heart healthy diet (full of fruits, vegetables, whole grains, lean protein, water--limit salt, fat, and sugar intake) and increase physical activity as tolerated.  Continue doing brain stimulating activities (puzzles, reading, adult coloring books, staying active) to keep memory sharp.   Bring a copy of your living will and/or healthcare power of attorney to your next office visit.   Stephanie Pierce , Thank you for taking time to come for your Medicare Wellness Visit. I appreciate your ongoing commitment to your health goals. Please review the following plan we discussed and let me know if I can assist you in the future.   These are the goals we discussed: Goals    . Maintain healthy active lifestyle.       This is a list of the screening recommended for you and due dates:  Health Maintenance  Topic Date Due  . Mammogram  11/19/2020  . Colon Cancer Screening  05/30/2025  . Tetanus Vaccine  02/02/2026  . Flu Shot  Completed  . DEXA scan (bone density measurement)  Completed  .  Hepatitis C: One time screening is recommended by Center for Disease Control  (CDC) for  adults born from [birth year] through 1964.   Completed  . Pneumonia vaccines  Completed    Preventive Care 16 Years and Older, Female Preventive care refers to lifestyle choices and visits with your health care provider that can promote health and wellness. This includes:  A yearly physical exam. This is also called an annual well check.  Regular dental and eye exams.  Immunizations.  Screening for certain conditions.  Healthy lifestyle choices, such as diet and exercise. What can I expect for my preventive care visit? Physical exam Your health care provider will check:  Height and weight. These may be used to calculate body mass index (BMI), which is a measurement that tells if you are at a healthy weight.  Heart rate and blood  pressure.  Your skin for abnormal spots. Counseling Your health care provider may ask you questions about:  Alcohol, tobacco, and drug use.  Emotional well-being.  Home and relationship well-being.  Sexual activity.  Eating habits.  History of falls.  Memory and ability to understand (cognition).  Work and work Statistician.  Pregnancy and menstrual history. What immunizations do I need?  Influenza (flu) vaccine  This is recommended every year. Tetanus, diphtheria, and pertussis (Tdap) vaccine  You may need a Td booster every 10 years. Varicella (chickenpox) vaccine  You may need this vaccine if you have not already been vaccinated. Zoster (shingles) vaccine  You may need this after age 68. Pneumococcal conjugate (PCV13) vaccine  One dose is recommended after age 62. Pneumococcal polysaccharide (PPSV23) vaccine  One dose is recommended after age 68. Measles, mumps, and rubella (MMR) vaccine  You may need at least one dose of MMR if you were born in 1957 or later. You may also need a second dose. Meningococcal conjugate (MenACWY) vaccine  You may need this if you have certain conditions. Hepatitis A vaccine  You may need this if you have certain conditions or if you travel or work in places where you may be exposed to hepatitis A. Hepatitis B vaccine  You may need this if you have certain conditions or if you travel or work in places where you may be exposed to hepatitis B. Haemophilus influenzae type b (Hib) vaccine  You may need this  if you have certain conditions. You may receive vaccines as individual doses or as more than one vaccine together in one shot (combination vaccines). Talk with your health care provider about the risks and benefits of combination vaccines. What tests do I need? Blood tests  Lipid and cholesterol levels. These may be checked every 5 years, or more frequently depending on your overall health.  Hepatitis C test.  Hepatitis B  test. Screening  Lung cancer screening. You may have this screening every year starting at age 68 if you have a 30-pack-year history of smoking and currently smoke or have quit within the past 15 years.  Colorectal cancer screening. All adults should have this screening starting at age 68 and continuing until age 68. Your health care provider may recommend screening at age 33 if you are at increased risk. You will have tests every 1-10 years, depending on your results and the type of screening test.  Diabetes screening. This is done by checking your blood sugar (glucose) after you have not eaten for a while (fasting). You may have this done every 1-3 years.  Mammogram. This may be done every 1-2 years. Talk with your health care provider about how often you should have regular mammograms.  BRCA-related cancer screening. This may be done if you have a family history of breast, ovarian, tubal, or peritoneal cancers. Other tests  Sexually transmitted disease (STD) testing.  Bone density scan. This is done to screen for osteoporosis. You may have this done starting at age 29. Follow these instructions at home: Eating and drinking  Eat a diet that includes fresh fruits and vegetables, whole grains, lean protein, and low-fat dairy products. Limit your intake of foods with high amounts of sugar, saturated fats, and salt.  Take vitamin and mineral supplements as recommended by your health care provider.  Do not drink alcohol if your health care provider tells you not to drink.  If you drink alcohol: ? Limit how much you have to 0-1 drink a day. ? Be aware of how much alcohol is in your drink. In the [place] one drink equals one 12 oz bottle of beer (355 mL), one 5 oz glass of wine (148 mL), or one 1 oz glass of hard liquor (44 mL). Lifestyle  Take daily care of your teeth and gums.  Stay active. Exercise for at least 30 minutes on 5 or more days each week.  Do not use any products that  contain nicotine or tobacco, such as cigarettes, e-cigarettes, and chewing tobacco. If you need help quitting, ask your health care provider.  If you are sexually active, practice safe sex. Use a condom or other form of protection in order to prevent STIs (sexually transmitted infections).  Talk with your health care provider about taking a low-dose aspirin or statin. What's next?  Go to your health care provider once a year for a well check visit.  Ask your health care provider how often you should have your eyes and teeth checked.  Stay up to date on all vaccines. This information is not intended to replace advice given to you by your health care provider. Make sure you discuss any questions you have with your health care provider. Document Revised: 08/08/2018 Document Reviewed: 08/08/2018 Elsevier Patient Education  2020 [employer].

## 2019-11-24 ENCOUNTER — Ambulatory Visit: Payer: Medicare Other | Admitting: Family Medicine

## 2019-12-01 MED FILL — LETROZOLE 2.5 MG TABS: 2.5 | 30 days supply | Qty: 30 | Fill #2

## 2019-12-01 MED FILL — FLUTICASONE PROP 50 MCG SPR: 50 | 30 days supply | Qty: 16 | Fill #1

## 2019-12-10 ENCOUNTER — Encounter: Payer: Self-pay | Admitting: Family Medicine

## 2019-12-10 ENCOUNTER — Telehealth: Payer: Self-pay | Admitting: *Deleted

## 2019-12-10 NOTE — Telephone Encounter (Signed)
Cologuard ordered

## 2019-12-17 MED FILL — SYNTHROID 100 MCG TABLET: 100 | 30 days supply | Qty: 30 | Fill #1

## 2019-12-30 ENCOUNTER — Other Ambulatory Visit: Payer: Self-pay

## 2019-12-30 ENCOUNTER — Ambulatory Visit
Admission: RE | Admit: 2019-12-30 | Discharge: 2019-12-30 | Disposition: A | Discharge status: Home / Self Care | Payer: Medicare Other | Source: Ambulatory Visit | Attending: Family Medicine | Admitting: Family Medicine

## 2019-12-30 DIAGNOSIS — Z853 Personal history of malignant neoplasm of breast: Secondary | ICD-10-CM

## 2019-12-30 DIAGNOSIS — R928 Other abnormal and inconclusive findings on diagnostic imaging of breast: Secondary | ICD-10-CM | POA: Diagnosis not present

## 2020-01-12 MED FILL — LOSARTAN POTASSIUM 100 MG T: 100 | 90 days supply | Qty: 90 | Fill #2

## 2020-01-12 MED FILL — SYNTHROID 100 MCG TABLET: 100 | 30 days supply | Qty: 30 | Fill #2

## 2020-01-28 ENCOUNTER — Other Ambulatory Visit: Payer: Self-pay

## 2020-01-28 ENCOUNTER — Ambulatory Visit (INDEPENDENT_AMBULATORY_CARE_PROVIDER_SITE_OTHER): Payer: Medicare Other | Admitting: *Deleted

## 2020-01-28 ENCOUNTER — Telehealth: Payer: Self-pay | Admitting: Family Medicine

## 2020-01-28 DIAGNOSIS — Z111 Encounter for screening for respiratory tuberculosis: Secondary | ICD-10-CM

## 2020-01-28 DIAGNOSIS — Z0184 Encounter for antibody response examination: Secondary | ICD-10-CM

## 2020-01-28 NOTE — Telephone Encounter (Signed)
Patient is requesting vaccine record sent to fax number [phone]

## 2020-01-28 NOTE — Telephone Encounter (Signed)
Patient came in for tb testing.  She also needs MMR titer.  Are you ok with ordering? I can place for you. 

## 2020-01-28 NOTE — Telephone Encounter (Signed)
Future order placed 

## 2020-01-28 NOTE — Addendum Note (Signed)
Addended by: Kem Boroughs D on: 01/28/2020 03:58 PM   Modules accepted: Orders

## 2020-01-28 NOTE — Telephone Encounter (Signed)
Left message on machine to call back.  She stated to Tiffany that we were missing a vaccine.  Need to know which one she is speaking of.

## 2020-01-28 NOTE — Progress Notes (Signed)
Patient her for tb test for employment.  Tb placed in left arm and patient tolerated well.

## 2020-01-28 NOTE — Telephone Encounter (Signed)
Yes please order her MMR titer.

## 2020-01-28 NOTE — Telephone Encounter (Signed)
Caller: Jeyli Call back phone number: [phone]  Patient is we have any records of MMR titer?

## 2020-01-29 ENCOUNTER — Other Ambulatory Visit (INDEPENDENT_AMBULATORY_CARE_PROVIDER_SITE_OTHER): Payer: Medicare Other

## 2020-01-29 DIAGNOSIS — Z0184 Encounter for antibody response examination: Secondary | ICD-10-CM | POA: Diagnosis not present

## 2020-01-30 LAB — TB SKIN TEST
Induration: 0 mm
TB Skin Test: NEGATIVE

## 2020-01-30 LAB — MEASLES/MUMPS/RUBELLA IMMUNITY
Mumps IgG: >300 AU/mL
Rubella: 32.4 Index
Rubeola IgG: >300 AU/mL

## 2020-02-03 ENCOUNTER — Ambulatory Visit: Payer: Medicare Other

## 2020-02-11 ENCOUNTER — Telehealth: Payer: Self-pay | Admitting: *Deleted

## 2020-02-11 ENCOUNTER — Encounter: Payer: Self-pay | Admitting: *Deleted

## 2020-02-11 NOTE — Telephone Encounter (Signed)
Received a fax from [facility] stating that they have not received sample back from patient.    Spoke with patient and she stated that she has never received it.  Number sent to patient in mychart for her to call [facility].

## 2020-02-17 MED FILL — SYNTHROID 100 MCG TABLET: 100 | 30 days supply | Qty: 30 | Fill #3

## 2020-02-24 ENCOUNTER — Other Ambulatory Visit (INDEPENDENT_AMBULATORY_CARE_PROVIDER_SITE_OTHER): Payer: Medicare Other

## 2020-02-24 ENCOUNTER — Other Ambulatory Visit: Payer: Self-pay

## 2020-02-24 DIAGNOSIS — E039 Hypothyroidism, unspecified: Secondary | ICD-10-CM | POA: Diagnosis not present

## 2020-02-24 DIAGNOSIS — E782 Mixed hyperlipidemia: Secondary | ICD-10-CM

## 2020-02-24 LAB — LIPID PANEL
Cholesterol: 217 mg/dL — ABNORMAL HIGH (ref 0–200)
HDL: 100.8 mg/dL (ref >39.00)
LDL Cholesterol: 110 mg/dL — ABNORMAL HIGH (ref 0–99)
NonHDL: 116.55
Total CHOL/HDL Ratio: 2
Triglycerides: 35 mg/dL (ref 0.0–149.0)
VLDL: 7 mg/dL (ref 0.0–40.0)

## 2020-02-24 LAB — CBC
HCT: 33.1 % — ABNORMAL LOW (ref 36.0–46.0)
Hemoglobin: 11 g/dL — ABNORMAL LOW (ref 12.0–15.0)
MCHC: 33.3 g/dL (ref 30.0–36.0)
MCV: 85 fl (ref 78.0–100.0)
Platelets: 148 10*3/uL — ABNORMAL LOW (ref 150.0–400.0)
RBC: 3.89 Mil/uL (ref 3.87–5.11)
RDW: 14.7 % (ref 11.5–15.5)
WBC: 4 10*3/uL (ref 4.0–10.5)

## 2020-02-24 LAB — COMPREHENSIVE METABOLIC PANEL
ALT: 21 U/L (ref 0–35)
AST: 24 U/L (ref 0–37)
Albumin: 3.9 g/dL (ref 3.5–5.2)
Alkaline Phosphatase: 70 U/L (ref 39–117)
BUN: 32 mg/dL — ABNORMAL HIGH (ref 6–23)
CO2: 31 mEq/L (ref 19–32)
Calcium: 9.9 mg/dL (ref 8.4–10.5)
Chloride: 104 mEq/L (ref 96–112)
Creatinine, Ser: 2.18 mg/dL — ABNORMAL HIGH (ref 0.40–1.20)
GFR: 27.15 mL/min — ABNORMAL LOW (ref >60.00)
Glucose, Bld: 89 mg/dL (ref 70–99)
Potassium: 4.1 mEq/L (ref 3.5–5.1)
Sodium: 142 mEq/L (ref 135–145)
Total Bilirubin: 0.6 mg/dL (ref 0.2–1.2)
Total Protein: 7.2 g/dL (ref 6.0–8.3)

## 2020-02-24 LAB — TSH: TSH: 0.48 u[IU]/mL (ref 0.35–4.50)

## 2020-02-24 LAB — COLOGUARD
COLOGUARD: NEGATIVE
Cologuard: NEGATIVE

## 2020-02-27 ENCOUNTER — Encounter: Payer: Self-pay | Admitting: Hematology & Oncology

## 2020-02-27 ENCOUNTER — Telehealth: Payer: Self-pay | Admitting: *Deleted

## 2020-02-27 ENCOUNTER — Encounter: Payer: Self-pay | Admitting: *Deleted

## 2020-02-27 ENCOUNTER — Inpatient Hospital Stay (HOSPITAL_BASED_OUTPATIENT_CLINIC_OR_DEPARTMENT_OTHER): Payer: Medicare Other | Admitting: Hematology & Oncology

## 2020-02-27 ENCOUNTER — Inpatient Hospital Stay: Payer: Medicare Other | Attending: Hematology & Oncology

## 2020-02-27 ENCOUNTER — Other Ambulatory Visit: Payer: Self-pay

## 2020-02-27 VITALS — BP 138/86 | HR 72 | Temp 98.2°F | Resp 18 | Wt 196.0 lb

## 2020-02-27 DIAGNOSIS — D500 Iron deficiency anemia secondary to blood loss (chronic): Secondary | ICD-10-CM

## 2020-02-27 DIAGNOSIS — D631 Anemia in chronic kidney disease: Secondary | ICD-10-CM | POA: Insufficient documentation

## 2020-02-27 DIAGNOSIS — D0511 Intraductal carcinoma in situ of right breast: Secondary | ICD-10-CM | POA: Diagnosis not present

## 2020-02-27 DIAGNOSIS — D508 Other iron deficiency anemias: Secondary | ICD-10-CM | POA: Insufficient documentation

## 2020-02-27 DIAGNOSIS — Z79811 Long term (current) use of aromatase inhibitors: Secondary | ICD-10-CM | POA: Diagnosis not present

## 2020-02-27 DIAGNOSIS — Z862 Personal history of diseases of the blood and blood-forming organs and certain disorders involving the immune mechanism: Secondary | ICD-10-CM | POA: Diagnosis not present

## 2020-02-27 DIAGNOSIS — Z923 Personal history of irradiation: Secondary | ICD-10-CM | POA: Insufficient documentation

## 2020-02-27 DIAGNOSIS — R918 Other nonspecific abnormal finding of lung field: Secondary | ICD-10-CM | POA: Insufficient documentation

## 2020-02-27 DIAGNOSIS — N189 Chronic kidney disease, unspecified: Secondary | ICD-10-CM | POA: Diagnosis not present

## 2020-02-27 DIAGNOSIS — Z79899 Other long term (current) drug therapy: Secondary | ICD-10-CM | POA: Insufficient documentation

## 2020-02-27 DIAGNOSIS — D0510 Intraductal carcinoma in situ of unspecified breast: Secondary | ICD-10-CM

## 2020-02-27 DIAGNOSIS — Z170 Estrogen receptor positive status [ER+]: Secondary | ICD-10-CM | POA: Insufficient documentation

## 2020-02-27 LAB — CBC WITH DIFFERENTIAL (CANCER CENTER ONLY)
Abs Immature Granulocytes: 0.04 10*3/uL (ref 0.00–0.07)
Basophils Absolute: 0 10*3/uL (ref 0.0–0.1)
Basophils Relative: 1 %
Eosinophils Absolute: 0.2 10*3/uL (ref 0.0–0.5)
Eosinophils Relative: 5 %
HCT: 35.6 % — ABNORMAL LOW (ref 36.0–46.0)
Hemoglobin: 11.4 g/dL — ABNORMAL LOW (ref 12.0–15.0)
Immature Granulocytes: 1 %
Lymphocytes Relative: 30 %
Lymphs Abs: 1.4 10*3/uL (ref 0.7–4.0)
MCH: 27.5 pg (ref 26.0–34.0)
MCHC: 32 g/dL (ref 30.0–36.0)
MCV: 86 fL (ref 80.0–100.0)
Monocytes Absolute: 0.6 10*3/uL (ref 0.1–1.0)
Monocytes Relative: 13 %
Neutro Abs: 2.4 10*3/uL (ref 1.7–7.7)
Neutrophils Relative %: 50 %
Platelet Count: 167 10*3/uL (ref 150–400)
RBC: 4.14 MIL/uL (ref 3.87–5.11)
RDW: 13.9 % (ref 11.5–15.5)
WBC Count: 4.7 10*3/uL (ref 4.0–10.5)
nRBC: 0 % (ref 0.0–0.2)

## 2020-02-27 LAB — CMP (CANCER CENTER ONLY)
ALT: 21 U/L (ref 0–44)
AST: 26 U/L (ref 15–41)
Albumin: 4 g/dL (ref 3.5–5.0)
Alkaline Phosphatase: 65 U/L (ref 38–126)
Anion gap: 7 (ref 5–15)
BUN: 39 mg/dL — ABNORMAL HIGH (ref 8–23)
CO2: 30 mmol/L (ref 22–32)
Calcium: 10 mg/dL (ref 8.9–10.3)
Chloride: 105 mmol/L (ref 98–111)
Creatinine: 2.29 mg/dL — ABNORMAL HIGH (ref 0.44–1.00)
GFR, Est AFR Am: 25 mL/min — ABNORMAL LOW (ref >60)
GFR, Estimated: 21 mL/min — ABNORMAL LOW (ref >60)
Glucose, Bld: 91 mg/dL (ref 70–99)
Potassium: 4 mmol/L (ref 3.5–5.1)
Sodium: 142 mmol/L (ref 135–145)
Total Bilirubin: 0.6 mg/dL (ref 0.3–1.2)
Total Protein: 7.7 g/dL (ref 6.5–8.1)

## 2020-02-27 LAB — RETICULOCYTES
Immature Retic Fract: 7.6 % (ref 2.3–15.9)
RBC.: 4.13 MIL/uL (ref 3.87–5.11)
Retic Count, Absolute: 38.8 10*3/uL (ref 19.0–186.0)
Retic Ct Pct: 0.9 % (ref 0.4–3.1)

## 2020-02-27 LAB — FERRITIN: Ferritin: 46 ng/mL (ref 11–307)

## 2020-02-27 LAB — IRON AND TIBC
Iron: 59 ug/dL (ref 41–142)
Saturation Ratios: 18 % — ABNORMAL LOW (ref 21–57)
TIBC: 326 ug/dL (ref 236–444)
UIBC: 267 ug/dL (ref 120–384)

## 2020-02-27 NOTE — Telephone Encounter (Signed)
Patient notified that result was negative.

## 2020-02-27 NOTE — Progress Notes (Signed)
Hematology and Oncology Follow Up Visit  Mara Favero [id number] [birth year] 69 y.o. 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 daily   Interim History: Ms. Curl is here today for follow-up.  She is doing okay.  So far, she really has had no complaints.  She is losing weight which is wonderful.  She is exercising.  She is very happy about able to exercise.  Para she is working.  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  She has had no problems with fever.  She has had no problems with cough or shortness of breath.  There is been no change in bowel or bladder habits.   Her last mammogram was done on 12/30/2019.  Everything looked fine on the mammogram.    She does have anemia.  This is secondary to renal insufficiency.  She has chronic renal insufficiency.  So far we have not had to address this.    Overall, her performance status is ECOG 0.   Medications:  Allergies as of 02/27/2020      Reactions   Iodine Hives   Lisinopril Swelling   Swelling in the throat   Penicillins Rash      Medication List       Accurate as of February 27, 2020  8:40 AM. If you have any questions, ask your nurse or doctor.        B-12 2500 MCG Tabs Take 1 tablet by mouth once a week.   CENTRUM SILVER ADULT 50+ PO Take 1 tablet by mouth every other day.   fluticasone 50 MCG/ACT nasal spray Commonly known as: FLONASE Place 2 sprays into both nostrils daily.   KRILL OIL OMEGA-3 PO Take by mouth daily.   letrozole 2.5 MG tablet Commonly known as: FEMARA TAKE 1 TABLET BY MOUTH ONCE DAILY   losartan 100 MG tablet Commonly known as: COZAAR Take 1 tablet (100 mg total) by mouth daily.   OVER THE COUNTER MEDICATION Take 1 tablet by mouth daily. CALCIUM 650MG  / VIT D3   Synthroid 100 MCG tablet Generic drug: levothyroxine Take 1 tablet (100 mcg total) by mouth daily  before breakfast.       Allergies:  Allergies  Allergen Reactions  . Iodine Hives  . Lisinopril Swelling    Swelling in the throat  . Penicillins Rash    Past Medical History, Surgical history, Social history, and Family History were reviewed and updated.  Review of Systems: Review of Systems  Constitutional: Negative.   HENT: Negative.   Eyes: Negative.   Respiratory: Negative.   Cardiovascular: Negative.   Gastrointestinal: Negative.   Genitourinary: Negative.   Musculoskeletal: Negative.   Skin: Negative.   Neurological: Negative.   Endo/Heme/Allergies: Negative.   Psychiatric/Behavioral: Negative.      Physical Exam:  weight is 196 lb (88.9 kg). Her temporal temperature is 98.2 F (36.8 C). Her blood pressure is 138/86 and her pulse is 72. Her respiration is 18 and oxygen saturation is 98%.   Wt Readings from Last 3 Encounters:  02/27/20 196 lb (88.9 kg)  11/17/19 196 lb 11.2 oz (89.2 kg)  10/29/19 206 lb 12.8 oz (93.8 kg)    Physical Exam Vitals reviewed.  Constitutional:      Comments: Her breast exam shows the right breast with the lumpectomy that is well-healed.  This is about the 10 o'clock  position.  No obvious nodularity is noted in the right breast.  There is no right axillary adenopathy.  Left breast is unremarkable.  There is no left axillary adenopathy.  HENT:     Head: Normocephalic and atraumatic.  Eyes:     Pupils: Pupils are equal, round, and reactive to light.  Cardiovascular:     Rate and Rhythm: Normal rate and regular rhythm.     Heart sounds: Normal heart sounds.  Pulmonary:     Effort: Pulmonary effort is normal.     Breath sounds: Normal breath sounds.  Abdominal:     General: Bowel sounds are normal.     Palpations: Abdomen is soft.  Musculoskeletal:        General: No tenderness or deformity. Normal range of motion.     Cervical back: Normal range of motion.  Lymphadenopathy:     Cervical: No cervical adenopathy.  Skin:     General: Skin is warm and dry.     Findings: No erythema or rash.  Neurological:     Mental Status: She is alert and oriented to person, place, and time.  Psychiatric:        Behavior: Behavior normal.        Thought Content: Thought content normal.        Judgment: Judgment normal.      Lab Results  Component Value Date   WBC 4.7 02/27/2020   HGB 11.4 (L) 02/27/2020   HCT 35.6 (L) 02/27/2020   MCV 86.0 02/27/2020   PLT 167 02/27/2020   Lab Results  Component Value Date   FERRITIN 126 10/29/2019   IRON 63 10/29/2019   TIBC 268 10/29/2019   UIBC 205 10/29/2019   IRONPCTSAT 23 10/29/2019   Lab Results  Component Value Date   RETICCTPCT 0.9 02/27/2020   RBC 4.13 02/27/2020   RBC 4.14 02/27/2020   No results found for: KPAFRELGTCHN, LAMBDASER, KAPLAMBRATIO No results found for: IGGSERUM, IGA, IGMSERUM No results found for: Odetta Pink, SPEI   Chemistry      Component Value Date/Time   NA 142 02/27/2020 0803   NA 147 (H) 06/08/2017 0748   NA 140 12/07/2016 0836   K 4.0 02/27/2020 0803   K 4.1 06/08/2017 0748   K 4.5 12/07/2016 0836   CL 105 02/27/2020 0803   CL 109 (H) 06/08/2017 0748   CO2 30 02/27/2020 0803   CO2 29 06/08/2017 0748   CO2 28 12/07/2016 0836   BUN 39 (H) 02/27/2020 0803   BUN 39 (H) 06/08/2017 0748   BUN 34.7 (H) 12/07/2016 0836   CREATININE 2.29 (H) 02/27/2020 0803   CREATININE 2.1 (H) 06/08/2017 0748   CREATININE 2.2 (H) 12/07/2016 0836      Component Value Date/Time   CALCIUM 10.0 02/27/2020 0803   CALCIUM 9.3 06/08/2017 0748   CALCIUM 9.3 12/07/2016 0836   ALKPHOS 65 02/27/2020 0803   ALKPHOS 69 06/08/2017 0748   ALKPHOS 73 12/07/2016 0836   AST 26 02/27/2020 0803   AST 23 12/07/2016 0836   ALT 21 02/27/2020 0803   ALT 27 06/08/2017 0748   ALT 21 12/07/2016 0836   BILITOT 0.6 02/27/2020 0803   BILITOT 0.55 12/07/2016 0836     Impression and Plan: Ms. Cleaves is a 69 yo  African American female with DCIS of the right breast diagnosed in July 2015 with lumpectomy.  She also had radiation therapy after the lobectomy.  I think this  was done in [place].  She elected to forego any hormonal therapy.   Everything looks quite stable.  I am very happy for her.  We will plan to see her back in 4 months.  I think this is very reasonable.  I know that she will have a wonderful summer.  Hopefully should be able to have family come in to visit.         Volanda Napoleon, MD 7/2/20218:40 AM

## 2020-03-03 ENCOUNTER — Other Ambulatory Visit: Payer: Self-pay | Admitting: *Deleted

## 2020-03-03 DIAGNOSIS — D0510 Intraductal carcinoma in situ of unspecified breast: Secondary | ICD-10-CM

## 2020-03-03 MED ORDER — LETROZOLE 2.5 MG PO TABS: 2.5000 mg | ORAL_TABLET | Freq: Every day | ORAL | 3 refills | Status: DC

## 2020-03-03 MED FILL — LETROZOLE 2.5 MG TABS: 2.5 | 30 days supply | Qty: 30 | Fill #5

## 2020-03-04 ENCOUNTER — Other Ambulatory Visit: Payer: Self-pay | Admitting: *Deleted

## 2020-03-04 MED ORDER — LOSARTAN POTASSIUM 100 MG PO TABS: 100.0000 mg | ORAL_TABLET | Freq: Every day | ORAL | 3 refills | Status: DC

## 2020-03-04 MED ORDER — FLUTICASONE PROPIONATE 50 MCG/ACT NA SUSP: 2.0000 | Freq: Every day | NASAL | 1 refills | Status: DC

## 2020-03-04 MED ORDER — SYNTHROID 100 MCG PO TABS: 100.0000 ug | ORAL_TABLET | Freq: Every day | ORAL | 1 refills | Status: DC

## 2020-05-08 DIAGNOSIS — Z23 Encounter for immunization: Secondary | ICD-10-CM | POA: Diagnosis not present

## 2020-05-15 IMAGING — MG DIGITAL DIAGNOSTIC BILATERAL MAMMOGRAM WITH TOMO AND CAD
8 of 11 series · 8 of 27 positions shown · non-contrast
Comparison: Previous exam(s).

CLINICAL DATA: 67-year-old female for annual bilateral mammogram.
History of RIGHT breast DCIS and RIGHT lumpectomy in 0521.

EXAM:
DIGITAL DIAGNOSTIC BILATERAL MAMMOGRAM WITH CAD AND TOMO

[R ML (1 of 2)]
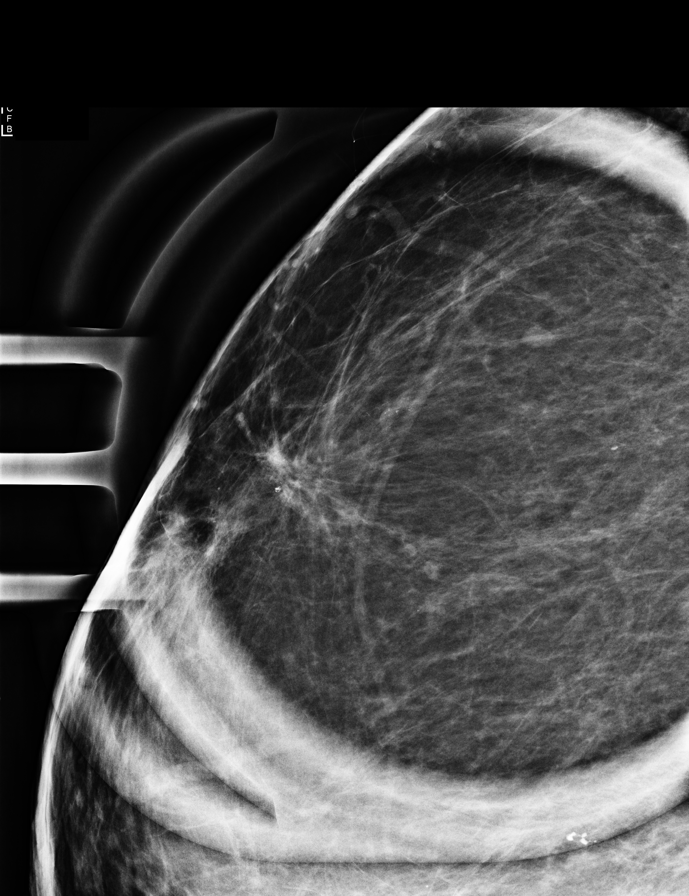

[R ML (2 of 2)]
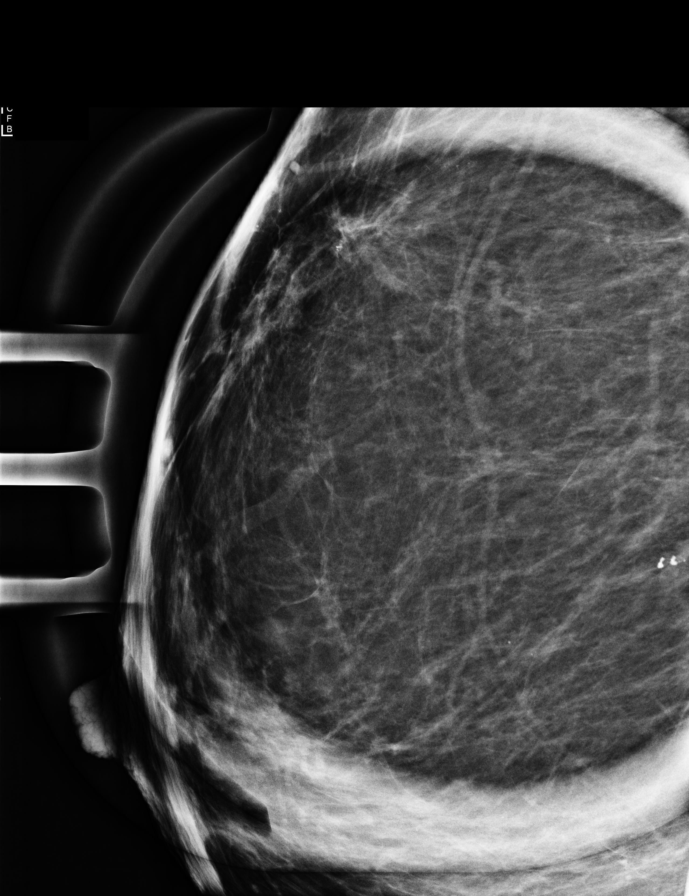

[R CC]
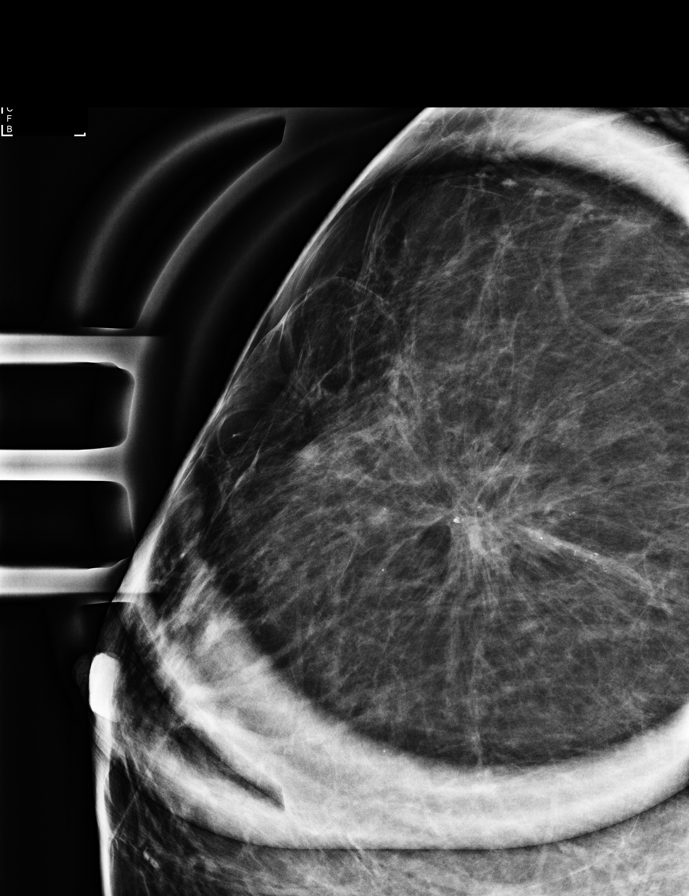

[L CC synth-2D]
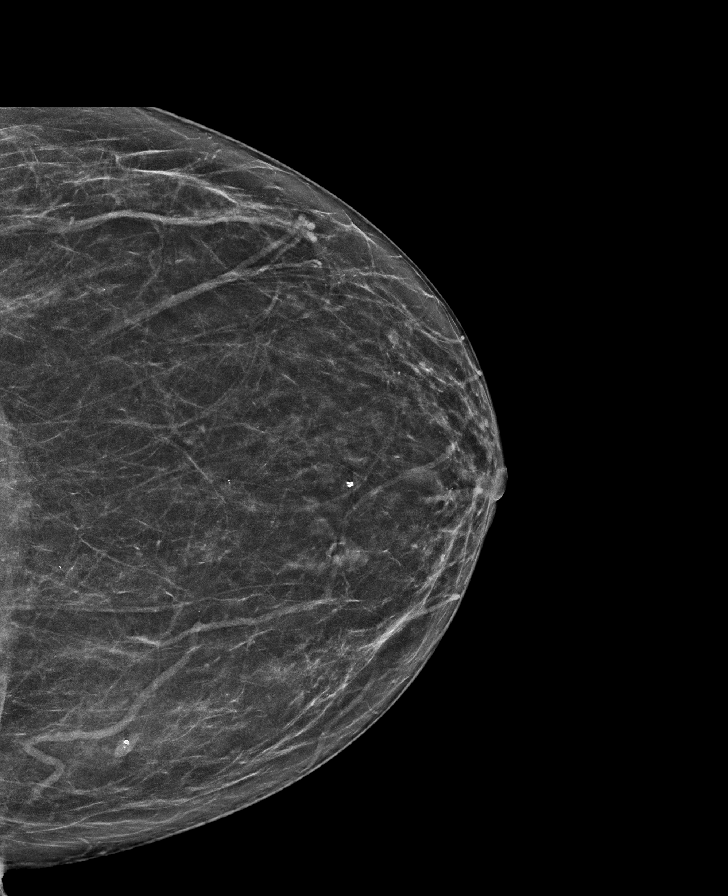

[L MLO synth-2D]
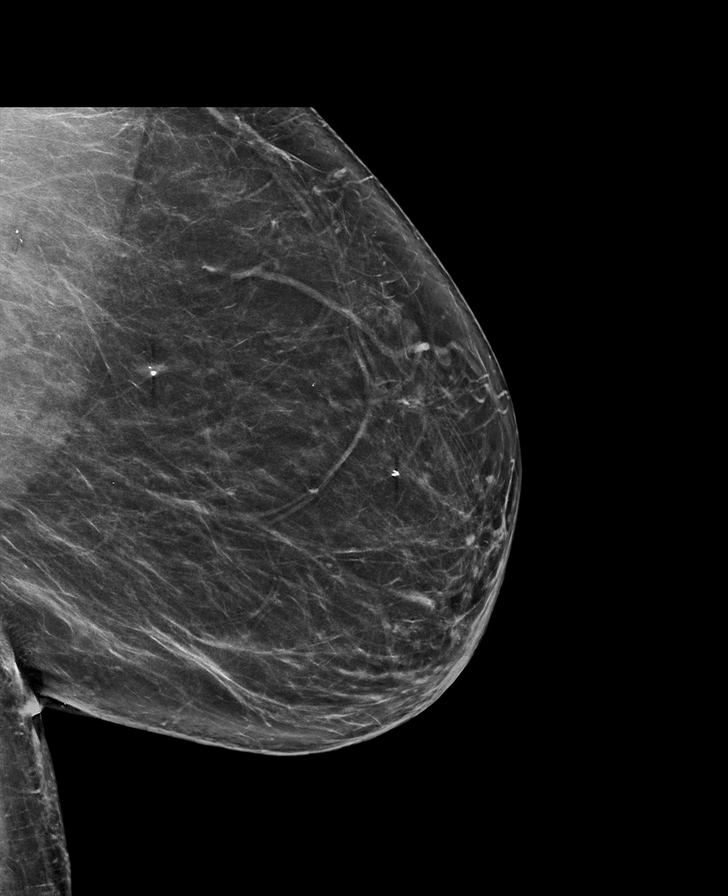

[R CC synth-2D]
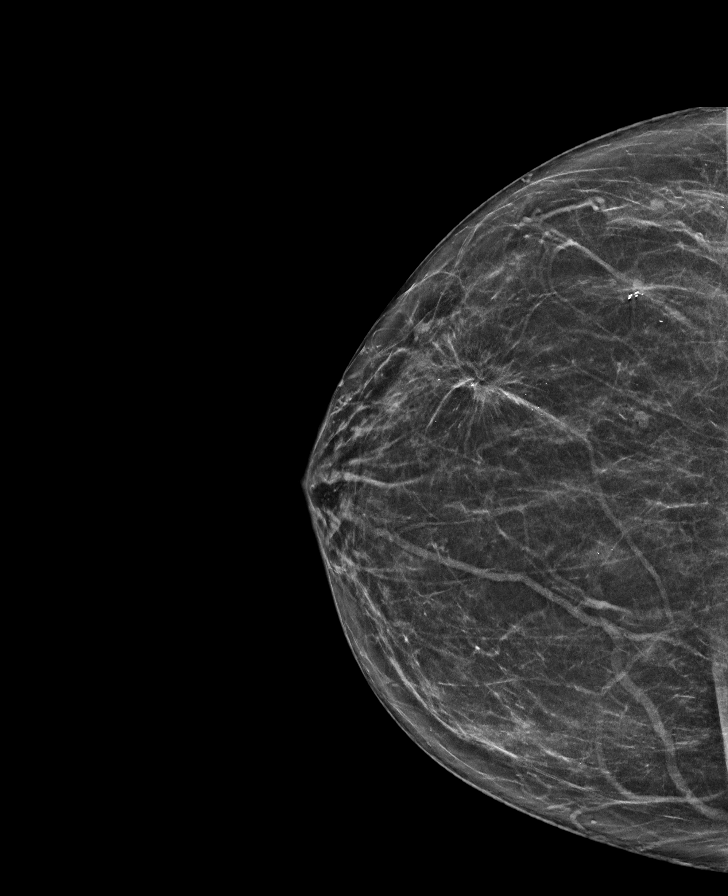

[R MLO synth-2D]
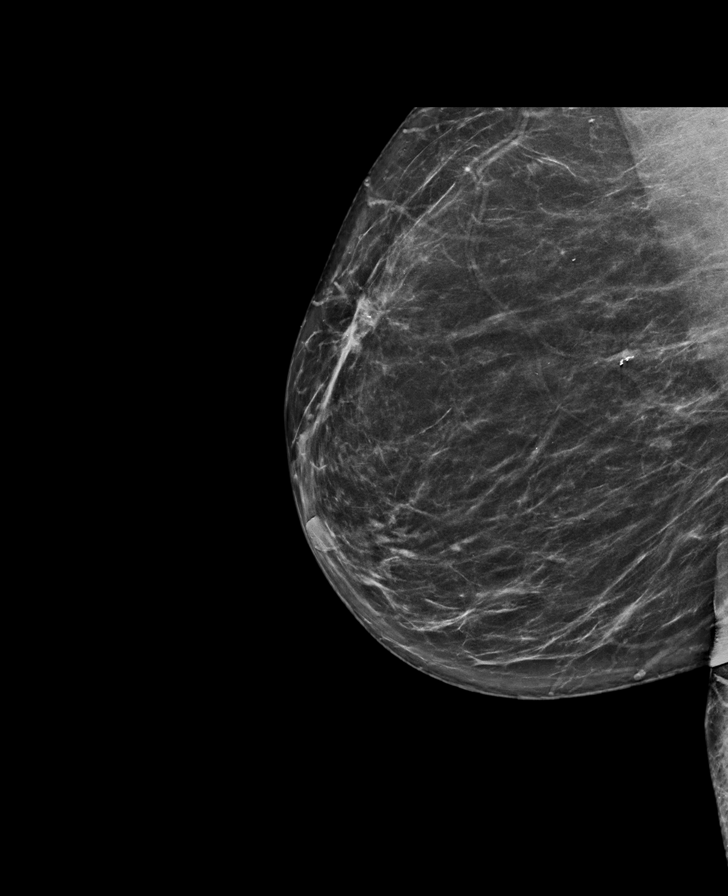

[R CC tomo · tomo slice 35/68.0]
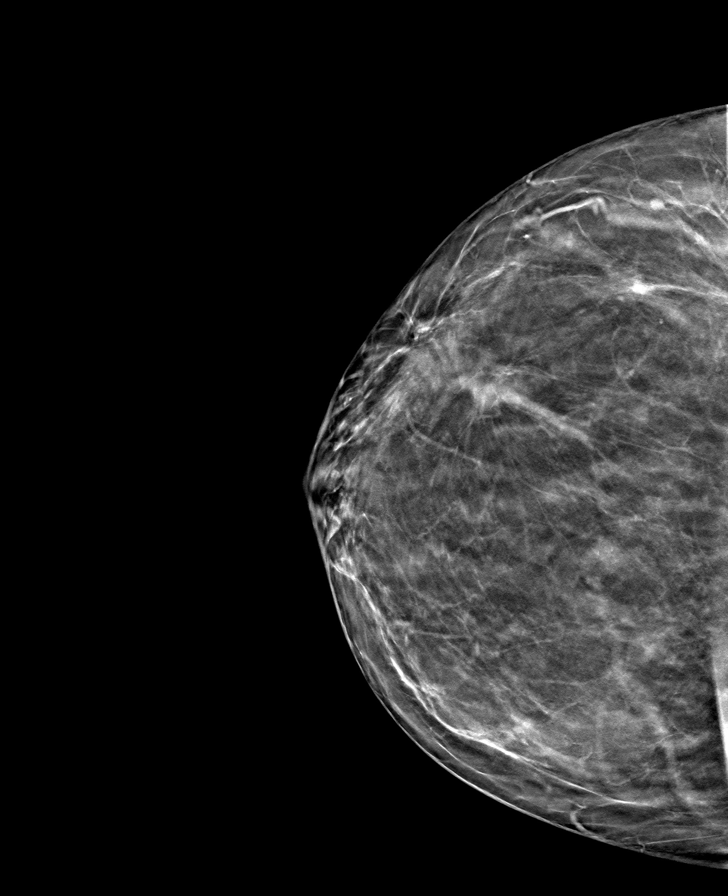

[8 of 27 positions shown; findings below may reference images not displayed]

ACR Breast Density Category b: There are scattered areas of
fibroglandular density.
FINDINGS: 2D and 3D full field views of both breasts and magnification views
of the RIGHT breast performed.

Lumpectomy changes within the UPPER-OUTER RIGHT breast are again
identified.

A new 1 cm group of heterogeneous calcifications in a linear
orientation immediately posterior/superior to the lumpectomy site
noted.

No other suspicious mammographic findings are noted within either
breast.

Mammographic images were processed with CAD.
IMPRESSION: New indeterminate 1 cm group of calcifications adjacent to the
patient's RIGHT lumpectomy site. Tissue sampling recommended to
exclude DCIS.

No other suspicious mammographic abnormalities.

RECOMMENDATION:
Stereotactic guided RIGHT breast biopsy, which will be scheduled.

I have discussed the findings and recommendations with the patient.
Results were also provided in writing at the conclusion of the
visit. If applicable, a reminder letter will be sent to the patient
regarding the next appointment.

BI-RADS CATEGORY  4: Suspicious.

## 2020-05-23 DIAGNOSIS — Z23 Encounter for immunization: Secondary | ICD-10-CM | POA: Diagnosis not present

## 2020-06-29 ENCOUNTER — Inpatient Hospital Stay: Payer: Medicare Other | Attending: Hematology & Oncology

## 2020-06-29 ENCOUNTER — Encounter: Payer: Self-pay | Admitting: Family

## 2020-06-29 ENCOUNTER — Inpatient Hospital Stay (HOSPITAL_BASED_OUTPATIENT_CLINIC_OR_DEPARTMENT_OTHER): Payer: Medicare Other | Admitting: Family

## 2020-06-29 ENCOUNTER — Other Ambulatory Visit: Payer: Self-pay

## 2020-06-29 ENCOUNTER — Telehealth: Payer: Self-pay | Admitting: Family

## 2020-06-29 ENCOUNTER — Inpatient Hospital Stay: Payer: Medicare Other | Admitting: Hematology & Oncology

## 2020-06-29 VITALS — BP 132/80 | HR 70 | Temp 97.9°F | Resp 18 | Ht 67.0 in | Wt 188.4 lb

## 2020-06-29 DIAGNOSIS — Z79811 Long term (current) use of aromatase inhibitors: Secondary | ICD-10-CM | POA: Diagnosis not present

## 2020-06-29 DIAGNOSIS — D0510 Intraductal carcinoma in situ of unspecified breast: Secondary | ICD-10-CM | POA: Diagnosis not present

## 2020-06-29 DIAGNOSIS — D0511 Intraductal carcinoma in situ of right breast: Secondary | ICD-10-CM | POA: Diagnosis not present

## 2020-06-29 DIAGNOSIS — D500 Iron deficiency anemia secondary to blood loss (chronic): Secondary | ICD-10-CM | POA: Diagnosis not present

## 2020-06-29 DIAGNOSIS — Z862 Personal history of diseases of the blood and blood-forming organs and certain disorders involving the immune mechanism: Secondary | ICD-10-CM

## 2020-06-29 DIAGNOSIS — R918 Other nonspecific abnormal finding of lung field: Secondary | ICD-10-CM | POA: Diagnosis not present

## 2020-06-29 DIAGNOSIS — Z79899 Other long term (current) drug therapy: Secondary | ICD-10-CM | POA: Diagnosis not present

## 2020-06-29 LAB — CBC WITH DIFFERENTIAL (CANCER CENTER ONLY)
Abs Immature Granulocytes: 0.04 10*3/uL (ref 0.00–0.07)
Basophils Absolute: 0.1 10*3/uL (ref 0.0–0.1)
Basophils Relative: 1 %
Eosinophils Absolute: 0.2 10*3/uL (ref 0.0–0.5)
Eosinophils Relative: 4 %
HCT: 36.8 % (ref 36.0–46.0)
Hemoglobin: 11.9 g/dL — ABNORMAL LOW (ref 12.0–15.0)
Immature Granulocytes: 1 %
Lymphocytes Relative: 31 %
Lymphs Abs: 1.4 10*3/uL (ref 0.7–4.0)
MCH: 27.8 pg (ref 26.0–34.0)
MCHC: 32.3 g/dL (ref 30.0–36.0)
MCV: 86 fL (ref 80.0–100.0)
Monocytes Absolute: 0.7 10*3/uL (ref 0.1–1.0)
Monocytes Relative: 16 %
Neutro Abs: 2.1 10*3/uL (ref 1.7–7.7)
Neutrophils Relative %: 47 %
Platelet Count: 161 10*3/uL (ref 150–400)
RBC: 4.28 MIL/uL (ref 3.87–5.11)
RDW: 14 % (ref 11.5–15.5)
WBC Count: 4.5 10*3/uL (ref 4.0–10.5)
nRBC: 0 % (ref 0.0–0.2)

## 2020-06-29 LAB — IRON AND TIBC
Iron: 66 ug/dL (ref 41–142)
Saturation Ratios: 20 % — ABNORMAL LOW (ref 21–57)
TIBC: 334 ug/dL (ref 236–444)
UIBC: 268 ug/dL (ref 120–384)

## 2020-06-29 LAB — FERRITIN: Ferritin: 64 ng/mL (ref 11–307)

## 2020-06-29 LAB — CMP (CANCER CENTER ONLY)
ALT: 17 U/L (ref 0–44)
AST: 24 U/L (ref 15–41)
Albumin: 4.1 g/dL (ref 3.5–5.0)
Alkaline Phosphatase: 73 U/L (ref 38–126)
Anion gap: 8 (ref 5–15)
BUN: 42 mg/dL — ABNORMAL HIGH (ref 8–23)
CO2: 28 mmol/L (ref 22–32)
Calcium: 10.5 mg/dL — ABNORMAL HIGH (ref 8.9–10.3)
Chloride: 105 mmol/L (ref 98–111)
Creatinine: 2.12 mg/dL — ABNORMAL HIGH (ref 0.44–1.00)
GFR, Estimated: 25 mL/min — ABNORMAL LOW (ref >60)
Glucose, Bld: 96 mg/dL (ref 70–99)
Potassium: 4.2 mmol/L (ref 3.5–5.1)
Sodium: 141 mmol/L (ref 135–145)
Total Bilirubin: 0.7 mg/dL (ref 0.3–1.2)
Total Protein: 8.1 g/dL (ref 6.5–8.1)

## 2020-06-29 LAB — RETICULOCYTES
Immature Retic Fract: 6.1 % (ref 2.3–15.9)
RBC.: 4.26 MIL/uL (ref 3.87–5.11)
Retic Count, Absolute: 41.7 10*3/uL (ref 19.0–186.0)
Retic Ct Pct: 1 % (ref 0.4–3.1)

## 2020-06-29 NOTE — Telephone Encounter (Signed)
Appointments scheduled patient requested updates from My Chart per 11/2 los

## 2020-06-29 NOTE — Progress Notes (Signed)
Hematology and Oncology Follow Up Visit  Stephanie Pierce [id number] 22-Feb-1951 69 y.o. 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 She is doing quite well other than having some sinus drainage.  No fever, chills, n/v, cough, rash, dizziness, SOB, chest pain, palpitations, abdominal pain or changes in bowel or bladder habits.  Bilateral breast exam today was negative. Right lumpectomy scar at the 10 o'clock position intact. No mass, lesion or rash noted.  No adenopathy on exam.  No swelling, tenderness, numbness or tingling in her extremities.  No falls or syncope.  She has maintained a good appetite and is staying well hydrated. Her weight is stable. At 188 lbs.   ECOG Performance Status: 1 - Symptomatic but completely ambulatory  Medications:  Allergies as of 06/29/2020      Reactions   Iodine Hives   Lisinopril Swelling   Swelling in the throat   Penicillins Rash      Medication List       Accurate as of June 29, 2020  8:21 AM. If you have any questions, ask your nurse or doctor.        B-12 2500 MCG Tabs Take 1 tablet by mouth once a week.   CENTRUM SILVER ADULT 50+ PO Take 1 tablet by mouth every other day.   fluticasone 50 MCG/ACT nasal spray Commonly known as: FLONASE Place 2 sprays into both nostrils daily.   KRILL OIL OMEGA-3 PO Take by mouth daily.   letrozole 2.5 MG tablet Commonly known as: FEMARA Take 1 tablet (2.5 mg total) by mouth daily.   losartan 100 MG tablet Commonly known as: COZAAR Take 1 tablet (100 mg total) by mouth daily.   OVER THE COUNTER MEDICATION Take 1 tablet by mouth daily. CALCIUM 650MG  / VIT D3   Synthroid 100 MCG tablet Generic drug: levothyroxine Take 1 tablet (100 mcg total) by mouth daily before breakfast.        Allergies:  Allergies  Allergen Reactions  . Iodine Hives  . Lisinopril Swelling    Swelling in the throat  . Penicillins Rash    Past Medical History, Surgical history, Social history, and Family History were reviewed and updated.  Review of Systems: All other 10 point review of systems is negative.   Physical Exam:  vitals were not taken for this visit.   Wt Readings from Last 3 Encounters:  02/27/20 196 lb (88.9 kg)  11/17/19 196 lb 11.2 oz (89.2 kg)  10/29/19 206 lb 12.8 oz (93.8 kg)    Ocular: Sclerae unicteric, pupils equal, round and reactive to light Ear-nose-throat: Oropharynx clear, dentition fair Lymphatic: No cervical, supraclavicular or axillary adenopathy Lungs no rales or rhonchi, good excursion bilaterally Heart regular rate and rhythm, no murmur appreciated Abd soft, nontender, positive bowel sounds MSK no focal spinal tenderness, no joint edema Neuro: non-focal, well-oriented, appropriate affect Breasts: No changes on bilateral breast exam, no mass, lesion or rash noted.   Lab Results  Component Value Date   WBC 4.5 06/29/2020   HGB 11.9 (L) 06/29/2020   HCT 36.8 06/29/2020   MCV 86.0 06/29/2020   PLT 161 06/29/2020   Lab Results  Component Value Date   FERRITIN 46 02/27/2020   IRON 59 02/27/2020   TIBC 326 02/27/2020  UIBC 267 02/27/2020   IRONPCTSAT 18 (L) 02/27/2020   Lab Results  Component Value Date   RETICCTPCT 1.0 06/29/2020   RBC 4.28 06/29/2020   RBC 4.26 06/29/2020   No results found for: KPAFRELGTCHN, LAMBDASER, KAPLAMBRATIO No results found for: IGGSERUM, IGA, IGMSERUM No results found for: Odetta Pink, SPEI   Chemistry      Component Value Date/Time   NA 142 02/27/2020 0803   NA 147 (H) 06/08/2017 0748   NA 140 12/07/2016 0836   K 4.0 02/27/2020 0803   K 4.1 06/08/2017 0748   K 4.5 12/07/2016 0836   CL 105 02/27/2020 0803   CL 109 (H) 06/08/2017 0748   CO2  30 02/27/2020 0803   CO2 29 06/08/2017 0748   CO2 28 12/07/2016 0836   BUN 39 (H) 02/27/2020 0803   BUN 39 (H) 06/08/2017 0748   BUN 34.7 (H) 12/07/2016 0836   CREATININE 2.29 (H) 02/27/2020 0803   CREATININE 2.1 (H) 06/08/2017 0748   CREATININE 2.2 (H) 12/07/2016 0836      Component Value Date/Time   CALCIUM 10.0 02/27/2020 0803   CALCIUM 9.3 06/08/2017 0748   CALCIUM 9.3 12/07/2016 0836   ALKPHOS 65 02/27/2020 0803   ALKPHOS 69 06/08/2017 0748   ALKPHOS 73 12/07/2016 0836   AST 26 02/27/2020 0803   AST 23 12/07/2016 0836   ALT 21 02/27/2020 0803   ALT 27 06/08/2017 0748   ALT 21 12/07/2016 0836   BILITOT 0.6 02/27/2020 0803   BILITOT 0.55 12/07/2016 0836       Impression and Plan: Stephanie Pierce a 69 yo African American female with DCIS of the right breast diagnosed in July 2015. This was treated in [place] with lumpectomy followed by radiation therapy. She elected to forego any hormonal therapy.  She continues to do well on Femara and will continue her same regimen.  Mammogram due again in May 2022.  Iron studies are pending. We can replace if needed.  Hgb is stable at 11.9, Creatinine 2.12 and BUN 42. No ESA needed at this time.  Follow-up in 4 months.  She was encouraged to contact our office with any questions or concerns.   Stephanie Peace, NP 11/2/20218:21 AM

## 2020-07-09 MED FILL — LETROZOLE 2.5 MG TABS: 2.5 | 30 days supply | Qty: 30 | Fill #6

## 2020-08-06 ENCOUNTER — Other Ambulatory Visit: Payer: Self-pay | Admitting: Hematology & Oncology

## 2020-08-06 DIAGNOSIS — D0510 Intraductal carcinoma in situ of unspecified breast: Secondary | ICD-10-CM

## 2020-08-06 MED FILL — LETROZOLE 2.5 MG TABS: 2.5 | 30 days supply | Qty: 30 | Fill #0

## 2020-09-01 ENCOUNTER — Other Ambulatory Visit: Payer: Self-pay | Admitting: Family Medicine

## 2020-09-09 ENCOUNTER — Telehealth: Payer: Self-pay | Admitting: Family Medicine

## 2020-09-09 ENCOUNTER — Other Ambulatory Visit: Payer: Self-pay | Admitting: Family Medicine

## 2020-09-09 MED ORDER — SYNTHROID 100 MCG PO TABS: 100.0000 ug | ORAL_TABLET | Freq: Every day | ORAL | 0 refills | Status: DC

## 2020-09-09 MED FILL — SYNTHROID 100 MCG TABLET: 100 | 15 days supply | Qty: 15 | Fill #0

## 2020-09-09 MED FILL — LETROZOLE 2.5 MG TABS: 2.5 | 30 days supply | Qty: 30 | Fill #1

## 2020-09-09 NOTE — Telephone Encounter (Signed)
15 day supply sent.

## 2020-09-09 NOTE — Telephone Encounter (Signed)
Patient said she just need a few to last her until she [facility]   Medication:SYNTHROID 100 MCG tablet [[id number]]       Has the patient contacted their pharmacy? no (If no, request that the patient contact the pharmacy for the refill.) (If yes, when and what did the pharmacy advise?)    Preferred Pharmacy (with phone number or street name):  [place], [place] [employer] Phone:  [phone]  Fax:  [phone]         Agent: Please be advised that RX refills may take up to 3 business days. We ask that you follow-up with your pharmacy.

## 2020-10-08 MED FILL — LETROZOLE 2.5 MG TABS: 2.5 | 30 days supply | Qty: 30 | Fill #2

## 2020-10-13 ENCOUNTER — Other Ambulatory Visit: Payer: Self-pay | Admitting: Family Medicine

## 2020-10-20 ENCOUNTER — Other Ambulatory Visit: Payer: Self-pay | Admitting: Family Medicine

## 2020-10-20 DIAGNOSIS — Z853 Personal history of malignant neoplasm of breast: Secondary | ICD-10-CM

## 2020-10-27 ENCOUNTER — Inpatient Hospital Stay: Payer: Medicare Other | Attending: Family

## 2020-10-27 ENCOUNTER — Encounter: Payer: Self-pay | Admitting: Family

## 2020-10-27 ENCOUNTER — Telehealth: Payer: Self-pay

## 2020-10-27 ENCOUNTER — Inpatient Hospital Stay (HOSPITAL_BASED_OUTPATIENT_CLINIC_OR_DEPARTMENT_OTHER): Payer: Medicare Other | Admitting: Family

## 2020-10-27 ENCOUNTER — Other Ambulatory Visit: Payer: Self-pay

## 2020-10-27 VITALS — BP 140/90 | HR 67 | Temp 98.2°F | Resp 17 | Wt 192.8 lb

## 2020-10-27 DIAGNOSIS — D0510 Intraductal carcinoma in situ of unspecified breast: Secondary | ICD-10-CM

## 2020-10-27 DIAGNOSIS — Z923 Personal history of irradiation: Secondary | ICD-10-CM | POA: Insufficient documentation

## 2020-10-27 DIAGNOSIS — D500 Iron deficiency anemia secondary to blood loss (chronic): Secondary | ICD-10-CM

## 2020-10-27 DIAGNOSIS — D0511 Intraductal carcinoma in situ of right breast: Secondary | ICD-10-CM | POA: Diagnosis not present

## 2020-10-27 DIAGNOSIS — Z79811 Long term (current) use of aromatase inhibitors: Secondary | ICD-10-CM | POA: Insufficient documentation

## 2020-10-27 LAB — CMP (CANCER CENTER ONLY)
ALT: 20 U/L (ref 0–44)
AST: 26 U/L (ref 15–41)
Albumin: 4 g/dL (ref 3.5–5.0)
Alkaline Phosphatase: 66 U/L (ref 38–126)
Anion gap: 5 (ref 5–15)
BUN: 40 mg/dL — ABNORMAL HIGH (ref 8–23)
CO2: 31 mmol/L (ref 22–32)
Calcium: 10.2 mg/dL (ref 8.9–10.3)
Chloride: 104 mmol/L (ref 98–111)
Creatinine: 2.1 mg/dL — ABNORMAL HIGH (ref 0.44–1.00)
GFR, Estimated: 25 mL/min — ABNORMAL LOW (ref >60)
Glucose, Bld: 90 mg/dL (ref 70–99)
Potassium: 4.1 mmol/L (ref 3.5–5.1)
Sodium: 140 mmol/L (ref 135–145)
Total Bilirubin: 0.6 mg/dL (ref 0.3–1.2)
Total Protein: 7.5 g/dL (ref 6.5–8.1)

## 2020-10-27 LAB — IRON AND TIBC
Iron: 63 ug/dL (ref 41–142)
Saturation Ratios: 19 % — ABNORMAL LOW (ref 21–57)
TIBC: 339 ug/dL (ref 236–444)
UIBC: 276 ug/dL (ref 120–384)

## 2020-10-27 LAB — CBC WITH DIFFERENTIAL (CANCER CENTER ONLY)
Abs Immature Granulocytes: 0.03 10*3/uL (ref 0.00–0.07)
Basophils Absolute: 0 10*3/uL (ref 0.0–0.1)
Basophils Relative: 1 %
Eosinophils Absolute: 0.2 10*3/uL (ref 0.0–0.5)
Eosinophils Relative: 3 %
HCT: 36.1 % (ref 36.0–46.0)
Hemoglobin: 11.7 g/dL — ABNORMAL LOW (ref 12.0–15.0)
Immature Granulocytes: 1 %
Lymphocytes Relative: 34 %
Lymphs Abs: 1.5 10*3/uL (ref 0.7–4.0)
MCH: 27.9 pg (ref 26.0–34.0)
MCHC: 32.4 g/dL (ref 30.0–36.0)
MCV: 86 fL (ref 80.0–100.0)
Monocytes Absolute: 0.6 10*3/uL (ref 0.1–1.0)
Monocytes Relative: 13 %
Neutro Abs: 2.2 10*3/uL (ref 1.7–7.7)
Neutrophils Relative %: 48 %
Platelet Count: 150 10*3/uL (ref 150–400)
RBC: 4.2 MIL/uL (ref 3.87–5.11)
RDW: 13.6 % (ref 11.5–15.5)
WBC Count: 4.5 10*3/uL (ref 4.0–10.5)
nRBC: 0 % (ref 0.0–0.2)

## 2020-10-27 LAB — RETICULOCYTES
Immature Retic Fract: 7.5 % (ref 2.3–15.9)
RBC.: 4.2 MIL/uL (ref 3.87–5.11)
Retic Count, Absolute: 35.7 10*3/uL (ref 19.0–186.0)
Retic Ct Pct: 0.9 % (ref 0.4–3.1)

## 2020-10-27 LAB — FERRITIN: Ferritin: 53 ng/mL (ref 11–307)

## 2020-10-27 NOTE — Telephone Encounter (Signed)
Called pt that los actually req pt to see dr Marin Olp at next visit, appt changed    Stephanie Pierce

## 2020-10-27 NOTE — Progress Notes (Signed)
Hematology and Oncology Follow Up Visit  Stephanie Pierce [id number] 02/23/1951 70 y.o. 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 She is doing quite well but notes some fatigue at times.  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  Bilateral breast exam negative. Right breast lumpectomy scar at the 10 o'clock position intact. No mass, lesion or rash noted. No adenopathy or lymphedema noted on exam.  She has scheduled her annual mammogram for 12/30/2020.  No fever, chills, n/v, cough, rash, dizziness, SOB, chest pain, palpitations, abdominal pain or changes in bowel or bladder habits.  No blood loss noted. No abnormal bruising or petechiae noted.  No swelling, tenderness, numbness or tingling in her extremities at this time.  No falls or syncope to report.  She has maintained a good appetite and is staying well hydrated. Her weight is stable at 168 lbs.  She has started walking again for exercise and is considering returning to the gym if the Covid case numbers stay down.   ECOG Performance Status: 1 - Symptomatic but completely ambulatory  Medications:  Allergies as of 10/27/2020      Reactions   Lisinopril Swelling   Swelling in the throat   Iodine Hives   Penicillins Rash      Medication List       Accurate as of October 27, 2020  8:53 AM. If you have any questions, ask your nurse or doctor.        B-12 2500 MCG Tabs Take 1 tablet by mouth once a week.   CENTRUM SILVER ADULT 50+ PO Take 1 tablet by mouth every other day.   fluticasone 50 MCG/ACT nasal spray Commonly known as: FLONASE USE 2 SPRAYS IN EACH NOSTRIL DAILY   KRILL OIL OMEGA-3 PO Take by mouth daily.   letrozole 2.5 MG  tablet Commonly known as: FEMARA TAKE 1 TABLET BY MOUTH ONCE DAILY   losartan 100 MG tablet Commonly known as: COZAAR Take 1 tablet (100 mg total) by mouth daily.   OVER THE COUNTER MEDICATION Take 1 tablet by mouth daily. CALCIUM 650MG  / VIT D3   Synthroid 100 MCG tablet Generic drug: levothyroxine Take 1 tablet (100 mcg total) by mouth daily before breakfast.       Allergies:  Allergies  Allergen Reactions  . Lisinopril Swelling    Swelling in the throat  . Iodine Hives  . Penicillins Rash    Past Medical History, Surgical history, Social history, and Family History were reviewed and updated.  Review of Systems: All other 10 point review of systems is negative.   Physical Exam:  weight is 192 lb 12 oz (87.4 kg). Her oral temperature is 98.2 F (36.8 C). Her blood pressure is 140/90 and her pulse is 67. Her respiration is 17 and oxygen saturation is 100%.   Wt Readings from Last 3 Encounters:  10/27/20 192 lb 12 oz (87.4 kg)  06/29/20 188 lb 6.4 oz (85.5 kg)  02/27/20 196 lb (88.9 kg)    Ocular: Sclerae unicteric, pupils equal, round and reactive to light Ear-nose-throat: Oropharynx clear, dentition fair Lymphatic: No cervical, supraclavicular or axillary adenopathy Lungs no rales or rhonchi, good excursion bilaterally Heart regular rate and rhythm, no murmur  appreciated Abd soft, nontender, positive bowel sounds, no liver or spleen tip palpated on exam, no fluid wave  MSK no focal spinal tenderness, no joint edema Neuro: non-focal, well-oriented, appropriate affect Breasts: Bilateral breast exam negative. No mass, lesion or rash noted.   Lab Results  Component Value Date   WBC 4.5 10/27/2020   HGB 11.7 (L) 10/27/2020   HCT 36.1 10/27/2020   MCV 86.0 10/27/2020   PLT 150 10/27/2020   Lab Results  Component Value Date   FERRITIN 64 06/29/2020   IRON 66 06/29/2020   TIBC 334 06/29/2020   UIBC 268 06/29/2020   IRONPCTSAT 20 (L) 06/29/2020   Lab  Results  Component Value Date   RETICCTPCT 0.9 10/27/2020   RBC 4.20 10/27/2020   No results found for: KPAFRELGTCHN, LAMBDASER, KAPLAMBRATIO No results found for: IGGSERUM, IGA, IGMSERUM No results found for: Odetta Pink, SPEI   Chemistry      Component Value Date/Time   NA 140 10/27/2020 0815   NA 147 (H) 06/08/2017 0748   NA 140 12/07/2016 0836   K 4.1 10/27/2020 0815   K 4.1 06/08/2017 0748   K 4.5 12/07/2016 0836   CL 104 10/27/2020 0815   CL 109 (H) 06/08/2017 0748   CO2 31 10/27/2020 0815   CO2 29 06/08/2017 0748   CO2 28 12/07/2016 0836   BUN 40 (H) 10/27/2020 0815   BUN 39 (H) 06/08/2017 0748   BUN 34.7 (H) 12/07/2016 0836   CREATININE 2.10 (H) 10/27/2020 0815   CREATININE 2.1 (H) 06/08/2017 0748   CREATININE 2.2 (H) 12/07/2016 0836      Component Value Date/Time   CALCIUM 10.2 10/27/2020 0815   CALCIUM 9.3 06/08/2017 0748   CALCIUM 9.3 12/07/2016 0836   ALKPHOS 66 10/27/2020 0815   ALKPHOS 69 06/08/2017 0748   ALKPHOS 73 12/07/2016 0836   AST 26 10/27/2020 0815   AST 23 12/07/2016 0836   ALT 20 10/27/2020 0815   ALT 27 06/08/2017 0748   ALT 21 12/07/2016 0836   BILITOT 0.6 10/27/2020 0815   BILITOT 0.55 12/07/2016 0836       Impression and Plan: StephaniePierce a 51 yo African American female with DCIS of the right breast diagnosed in July 2015. This was treated in [place] with lumpectomy followed by radiation therapy. She elected to forego any hormonal therapy.  With recurrence she was started on Femara in April 2020. She continues to do well and will stay on her same regimen.  Iron studies are pending. We will replace if needed.  Follow-up in 4 months.  She can contact our office with any questions or concerns. We can certainly see her sooner if needed.   Stephanie Peace, NP 3/2/20228:53 AM

## 2020-10-27 NOTE — Telephone Encounter (Signed)
appts made and pt req to view on my chart per 10/27/20   Stephanie Pierce

## 2020-10-28 ENCOUNTER — Other Ambulatory Visit: Payer: Self-pay | Admitting: Hematology & Oncology

## 2020-10-28 ENCOUNTER — Other Ambulatory Visit: Payer: Self-pay | Admitting: *Deleted

## 2020-10-28 ENCOUNTER — Telehealth: Payer: Self-pay | Admitting: *Deleted

## 2020-10-28 MED ORDER — FUSION PLUS PO CAPS: 1.0000 | ORAL_CAPSULE | Freq: Once | ORAL | 3 refills | Status: DC

## 2020-10-28 MED FILL — FUSION PLUS CAPSULE: 30 days supply | Qty: 30 | Fill #0

## 2020-10-28 NOTE — Telephone Encounter (Signed)
Notified pt of results and Sarah, NP recommendations. Pt states she has been taking an otc oral supplement that has caused some itching. Pt agreed to try Fusion Plus. Rx sent to [facility]. No further concerns at this time.

## 2020-10-28 NOTE — Telephone Encounter (Signed)
-----   Message from Eliezer Bottom, NP sent at 10/27/2020 11:40 PM EST ----- Iron saturation is slightly low. Would she be agreeable to trying an oral supplement? If so I will sent in a script for Fusion plus. This also contains folate and a probiotic which helps reduce GI upset. Thank you!    ----- Message ----- From: [facility] [place] Sent: 10/27/2020   8:28 AM EST To: Eliezer Bottom, NP

## 2020-11-08 ENCOUNTER — Other Ambulatory Visit: Payer: Self-pay | Admitting: Family Medicine

## 2020-11-27 DIAGNOSIS — Z23 Encounter for immunization: Secondary | ICD-10-CM | POA: Diagnosis not present

## 2020-12-24 ENCOUNTER — Other Ambulatory Visit: Payer: Self-pay | Admitting: Family Medicine

## 2020-12-30 ENCOUNTER — Ambulatory Visit
Admission: RE | Admit: 2020-12-30 | Discharge: 2020-12-30 | Disposition: A | Discharge status: Home / Self Care | Payer: Medicare Other | Source: Ambulatory Visit | Attending: Family Medicine | Admitting: Family Medicine

## 2020-12-30 ENCOUNTER — Other Ambulatory Visit: Payer: Self-pay

## 2020-12-30 DIAGNOSIS — Z853 Personal history of malignant neoplasm of breast: Secondary | ICD-10-CM | POA: Diagnosis not present

## 2020-12-30 DIAGNOSIS — R922 Inconclusive mammogram: Secondary | ICD-10-CM | POA: Diagnosis not present

## 2021-02-21 ENCOUNTER — Other Ambulatory Visit: Payer: Self-pay | Admitting: Family Medicine

## 2021-03-18 ENCOUNTER — Ambulatory Visit (INDEPENDENT_AMBULATORY_CARE_PROVIDER_SITE_OTHER): Payer: Medicare Other

## 2021-03-18 VITALS — Ht 67.0 in | Wt 188.9 lb

## 2021-03-18 DIAGNOSIS — Z0000 Encounter for general adult medical examination without abnormal findings: Secondary | ICD-10-CM

## 2021-03-18 NOTE — Patient Instructions (Signed)
Stephanie Pierce , Thank you for taking time to complete your Medicare Wellness Visit. I appreciate your ongoing commitment to your health goals. Please review the following plan we discussed and let me know if I can assist you in the future.   Screening recommendations/referrals: Colonoscopy: Cologuard completed 02/20/2020-Due 02/20/2023 Mammogram: Completed 12/30/2020-Due 12/30/2021 Bone Density: Due-Per our conversation, you will complete with your next mammogram. Recommended yearly ophthalmology/optometry visit for glaucoma screening and checkup Recommended yearly dental visit for hygiene and checkup  Vaccinations: Influenza vaccine: Due 04/2021 Pneumococcal vaccine: Up to date Tdap vaccine: Up to date-Due-02/02/2026 Shingles vaccine: Completed first dose of Shingrix   Covid-19:Up to date  Advanced directives: Declined information today  Conditions/risks identified: See problem list  Next appointment: Follow up in one year for your annual wellness visit    Preventive Care 69 Years and Older, Female Preventive care refers to lifestyle choices and visits with your health care provider that can promote health and wellness. What does preventive care include? A yearly physical exam. This is also called an annual well check. Dental exams once or twice a year. Routine eye exams. Ask your health care provider how often you should have your eyes checked. Personal lifestyle choices, including: Daily care of your teeth and gums. Regular physical activity. Eating a healthy diet. Avoiding tobacco and drug use. Limiting alcohol use. Practicing safe sex. Taking low-dose aspirin every day. Taking vitamin and mineral supplements as recommended by your health care provider. What happens during an annual well check? The services and screenings done by your health care provider during your annual well check will depend on your age, overall health, lifestyle risk factors, and family history of  disease. Counseling  Your health care provider may ask you questions about your: Alcohol use. Tobacco use. Drug use. Emotional well-being. Home and relationship well-being. Sexual activity. Eating habits. History of falls. Memory and ability to understand (cognition). Work and work Statistician. Reproductive health. Screening  You may have the following tests or measurements: Height, weight, and BMI. Blood pressure. Lipid and cholesterol levels. These may be checked every 5 years, or more frequently if you are over 19 years old. Skin check. Lung cancer screening. You may have this screening every year starting at age 49 if you have a 30-pack-year history of smoking and currently smoke or have quit within the past 15 years. Fecal occult blood test (FOBT) of the stool. You may have this test every year starting at age 62. Flexible sigmoidoscopy or colonoscopy. You may have a sigmoidoscopy every 5 years or a colonoscopy every 10 years starting at age 69. Hepatitis C blood test. Hepatitis B blood test. Sexually transmitted disease (STD) testing. Diabetes screening. This is done by checking your blood sugar (glucose) after you have not eaten for a while (fasting). You may have this done every 1-3 years. Bone density scan. This is done to screen for osteoporosis. You may have this done starting at age 33. Mammogram. This may be done every 1-2 years. Talk to your health care provider about how often you should have regular mammograms. Talk with your health care provider about your test results, treatment options, and if necessary, the need for more tests. Vaccines  Your health care provider may recommend certain vaccines, such as: Influenza vaccine. This is recommended every year. Tetanus, diphtheria, and acellular pertussis (Tdap, Td) vaccine. You may need a Td booster every 10 years. Zoster vaccine. You may need this after age 52. Pneumococcal 13-valent conjugate (PCV13) vaccine. One  dose is recommended after age 44. Pneumococcal polysaccharide (PPSV23) vaccine. One dose is recommended after age 89. Talk to your health care provider about which screenings and vaccines you need and how often you need them. This information is not intended to replace advice given to you by your health care provider. Make sure you discuss any questions you have with your health care provider. Document Released: 09/10/2015 Document Revised: 05/03/2016 Document Reviewed: 06/15/2015 Elsevier Interactive Patient Education  2017 [place] Prevention in the Home Falls can cause injuries. They can happen to people of all ages. There are many things you can do to make your home safe and to help prevent falls. What can I do on the outside of my home? Regularly fix the edges of walkways and driveways and fix any cracks. Remove anything that might make you trip as you walk through a door, such as a raised step or threshold. Trim any bushes or trees on the path to your home. Use bright outdoor lighting. Clear any walking paths of anything that might make someone trip, such as rocks or tools. Regularly check to see if handrails are loose or broken. Make sure that both sides of any steps have handrails. Any raised decks and porches should have guardrails on the edges. Have any leaves, snow, or ice cleared regularly. Use sand or salt on walking paths during winter. Clean up any spills in your garage right away. This includes oil or grease spills. What can I do in the bathroom? Use night lights. Install grab bars by the toilet and in the tub and shower. Do not use towel bars as grab bars. Use non-skid mats or decals in the tub or shower. If you need to sit down in the shower, use a plastic, non-slip stool. Keep the floor dry. Clean up any water that spills on the floor as soon as it happens. Remove soap buildup in the tub or shower regularly. Attach bath mats securely with double-sided  non-slip rug tape. Do not have throw rugs and other things on the floor that can make you trip. What can I do in the bedroom? Use night lights. Make sure that you have a light by your bed that is easy to reach. Do not use any sheets or blankets that are too big for your bed. They should not hang down onto the floor. Have a firm chair that has side arms. You can use this for support while you get dressed. Do not have throw rugs and other things on the floor that can make you trip. What can I do in the kitchen? Clean up any spills right away. Avoid walking on wet floors. Keep items that you use a lot in easy-to-reach places. If you need to reach something above you, use a strong step stool that has a grab bar. Keep electrical cords out of the way. Do not use floor polish or wax that makes floors slippery. If you must use wax, use non-skid floor wax. Do not have throw rugs and other things on the floor that can make you trip. What can I do with my stairs? Do not leave any items on the stairs. Make sure that there are handrails on both sides of the stairs and use them. Fix handrails that are broken or loose. Make sure that handrails are as long as the stairways. Check any carpeting to make sure that it is firmly attached to the stairs. Fix any carpet that is loose or worn. Avoid  having throw rugs at the top or bottom of the stairs. If you do have throw rugs, attach them to the floor with carpet tape. Make sure that you have a light switch at the top of the stairs and the bottom of the stairs. If you do not have them, ask someone to add them for you. What else can I do to help prevent falls? Wear shoes that: Do not have high heels. Have rubber bottoms. Are comfortable and fit you well. Are closed at the toe. Do not wear sandals. If you use a stepladder: Make sure that it is fully opened. Do not climb a closed stepladder. Make sure that both sides of the stepladder are locked into place. Ask  someone to hold it for you, if possible. Clearly mark and make sure that you can see: Any grab bars or handrails. First and last steps. Where the edge of each step is. Use tools that help you move around (mobility aids) if they are needed. These include: Canes. Walkers. Scooters. Crutches. Turn on the lights when you go into a dark area. Replace any light bulbs as soon as they burn out. Set up your furniture so you have a clear path. Avoid moving your furniture around. If any of your floors are uneven, fix them. If there are any pets around you, be aware of where they are. Review your medicines with your doctor. Some medicines can make you feel dizzy. This can increase your chance of falling. Ask your doctor what other things that you can do to help prevent falls. This information is not intended to replace advice given to you by your health care provider. Make sure you discuss any questions you have with your health care provider. Document Released: 06/10/2009 Document Revised: 01/20/2016 Document Reviewed: 09/18/2014 Elsevier Interactive Patient Education  2017 [employer].

## 2021-03-18 NOTE — Progress Notes (Signed)
Subjective:   Stephanie Pierce is a 70 y.o. female who presents for Medicare Annual (Subsequent) preventive examination.  I connected with Stephanie Pierce today by telephone and verified that I am speaking with the correct person using two identifiers. Location patient: home Location provider: work Persons participating in the virtual visit: patient, Marine scientist.    I discussed the limitations, risks, security and privacy concerns of performing an evaluation and management service by telephone and the availability of in person appointments. I also discussed with the patient that there may be a patient responsible charge related to this service. The patient expressed understanding and verbally consented to this telephonic visit.    Interactive audio and video telecommunications were attempted between this provider and patient, however failed, due to patient having technical difficulties OR patient did not have access to video capability.  We continued and completed visit with audio only.  Some vital signs may be absent or patient reported.   Time Spent with patient on telephone encounter: 20 minutes   Review of Systems     Cardiac Risk Factors include: advanced age (>56men, >13 women);dyslipidemia     Objective:    Today's Vitals   03/18/21 0900  Weight: 188 lb 14.4 oz (85.7 kg)  Height: 5\' 7"  (1.702 m)   Body mass index is 29.59 kg/m.  Advanced Directives 03/18/2021 10/27/2020 06/29/2020 11/21/2019 10/29/2019 07/02/2019 02/26/2019  Does Patient Have a Medical Advance Directive? No No No No No Yes Yes  Type of Advance Directive - - - - - Living will -  Does patient want to make changes to medical advance directive? - - - No - Patient declined - - No - Patient declined  Would patient like information on creating a medical advance directive? No - Patient declined No - Patient declined No - Patient declined - No - Patient declined No - Patient declined -    Current Medications  (verified) Outpatient Encounter Medications as of 03/18/2021  Medication Sig   Cyanocobalamin (B-12) 2500 MCG TABS Take 1 tablet by mouth once a week.   fluticasone (FLONASE) 50 MCG/ACT nasal spray USE 2 SPRAYS IN EACH NOSTRIL DAILY   KRILL OIL OMEGA-3 PO Take by mouth daily.    letrozole (FEMARA) 2.5 MG tablet TAKE 1 TABLET BY MOUTH ONCE DAILY   losartan (COZAAR) 100 MG tablet TAKE 1 TABLET DAILY   Multiple Vitamins-Minerals (CENTRUM SILVER ADULT 50+ PO) Take 1 tablet by mouth every other day.   OVER THE COUNTER MEDICATION Take 1 tablet by mouth daily. CALCIUM 650MG  / VIT D3   SYNTHROID 100 MCG tablet TAKE 1 TABLET DAILY BEFORE BREAKFAST   [DISCONTINUED] Iron-FA-B Cmp-C-Biot-Probiotic (FUSION PLUS) CAPS TAKE ONE CAPSULE BY MOUTH ONCE DAILY.   No facility-administered encounter medications on file as of 03/18/2021.    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   Enlarged thyroid gland    H/O iron deficiency anemia 03/23/2015   History of chicken pox 03/23/2015   Hyperlipidemia, mixed 03/23/2015   Hypothyroidism 03/23/2015   Overweight 03/23/2015   Personal history of radiation therapy    Pneumonia 2012   Renal insufficiency 03/23/2015   S/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  Vitamin D deficiency 05/12/2015   Past Surgical History:  Procedure Laterality Date   BREAST LUMPECTOMY Right    h/o 2 needle biopsy, in 2015 right Lumpectomy DCIS stage 0   BREAST LUMPECTOMY WITH RADIOACTIVE SEED LOCALIZATION Right 02/07/2019   Procedure: RIGHT BREAST LUMPECTOMY WITH RADIOACTIVE SEED LOCALIZATION;  Surgeon: Alphonsa Overall, MD;  Location: [facility];  Service: General;  Laterality: Right;   CESAREAN SECTION  1985    LAPAROSCOPIC GASTRIC SLEEVE RESECTION     Family History  Problem Relation Age of Onset   Hypertension Father    Heart attack Father    Alcohol abuse Father    Diabetes Brother    Other Brother        complications of exposure to agent orange   Stroke Brother    Diverticulosis Daughter    Diabetes Sister    Hypertension Sister    Cancer Sister        metastatic breast cancer   Breast cancer Sister    Social History   Socioeconomic History   Marital status: Married    Spouse name: Not on file   Number of children: Not on file   Years of education: 18   Highest education level: Not on file  Occupational History   Occupation: RN Tourist information centre manager  Tobacco Use   Smoking status: Never   Smokeless tobacco: Never  Vaping Use   Vaping Use: Never used  Substance and Sexual Activity   Alcohol use: No    Alcohol/week: 0.0 standard drinks   Drug use: No   Sexual activity: Yes    Comment: 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, RN case Manager, no dietary restrictions follows heart healthy diet with 60 to 80 gm of protein  Other Topics Concern   Not on file  Social History Narrative   Not on file   Social Determinants of Health   Financial Resource Strain: Low Risk    Difficulty of Paying Living Expenses: Not hard at all  Food Insecurity: No Food Insecurity   Worried About Charity fundraiser in the Last Year: Never true   [place] in the Last Year: Never true  Transportation Needs: No Transportation Needs   Lack of Transportation (Medical): No   Lack of Transportation (Non-Medical): No  Physical Activity: Sufficiently Active   Days of Exercise per Week: 7 days   Minutes of Exercise per Session: 30 min  Stress: No Stress Concern Present   Feeling of Stress : Not at all  Social Connections: Socially Integrated   Frequency of Communication with Friends and Family: More than three times a week   Frequency of Social Gatherings with Friends and Family: More than three times a week    Attends Religious Services: 1 to 4 times per year   Active Member of [employer] or Organizations: Yes   Attends Archivist Meetings: 1 to 4 times per year   Marital Status: Married    Tobacco Counseling Counseling given: Not Answered   Clinical Intake:  Pre-visit preparation completed: Yes  Pain : No/denies pain     Nutritional Status: BMI 25 -29 Overweight Nutritional Risks: None Diabetes: No  How often do you need to have someone help you when you read instructions, pamphlets, or other written materials from your doctor or pharmacy?: 1 - Never  Diabetic?No  Interpreter Needed?: No  Information entered by :: Caroleen Hamman LPN   Activities of Daily Living In your present state of health, do you  have any difficulty performing the following activities: 03/18/2021  Hearing? N  Vision? N  Difficulty concentrating or making decisions? N  Walking or climbing stairs? N  Dressing or bathing? N  Doing errands, shopping? N  Preparing Food and eating ? N  Using the Toilet? N  In the past six months, have you accidently leaked urine? N  Do you have problems with loss of bowel control? N  Managing your Medications? N  Managing your Finances? N  Housekeeping or managing your Housekeeping? N  Some recent data might be hidden    Patient Care Team: Mosie Lukes, MD as PCP - General (Family Medicine) Marin Olp Rudell Cobb, MD as Consulting Physician (Oncology)  Indicate any recent Medical Services you may have received from other than [facility] providers in the past year (date may be approximate).     Assessment:   This is a routine wellness examination for Stephanie Pierce.  Hearing/Vision screen Hearing Screening - Comments:: No issues Vision Screening - Comments:: Last eye exam-02/2021-[facility]  Dietary issues and exercise activities discussed: Current Exercise Habits: Home exercise routine, Type of exercise: walking, Time (Minutes): 30, Frequency (Times/Week): 7,  Weekly Exercise (Minutes/Week): 210, Intensity: Mild, Exercise limited by: None identified   Goals Addressed             This Visit's Progress    Maintain healthy active lifestyle.   On track      Depression Screen PHQ 2/9 Scores 03/18/2021 11/21/2019 10/05/2017 04/08/2015 04/05/2015  PHQ - 2 Score 0 0 0 0 0    Fall Risk Fall Risk  03/18/2021 11/21/2019 10/05/2017 06/08/2016 03/02/2016  Falls in the past year? 0 0 No No No  Number falls in past yr: 0 0 - - -  Injury with Fall? 0 0 - - -  Follow up Falls prevention discussed Education provided;Falls prevention discussed - - -    FALL RISK PREVENTION PERTAINING TO THE HOME:  Any stairs in or around the home? Yes  If so, are there any without handrails? No  Home free of loose throw rugs in walkways, pet beds, electrical cords, etc? Yes  Adequate lighting in your home to reduce risk of falls? Yes   ASSISTIVE DEVICES UTILIZED TO PREVENT FALLS:  Life alert? No  Use of a cane, walker or w/c? No  Grab bars in the bathroom? No  Shower chair or bench in shower? No  Elevated toilet seat or a handicapped toilet? No   TIMED UP AND GO:  Was the test performed? No . Phone visit   Cognitive Function:Normal cognitive status assessed by this Nurse Health Advisor. No abnormalities found.          Immunizations Immunization History  Administered Date(s) Administered   Influenza,inj,Quad PF,6+ Mos 06/08/2016   Influenza-Unspecified 05/28/2014, 05/29/2015, 06/08/2016, 06/15/2017, 05/04/2018, 05/10/2019   PFIZER(Purple Top)SARS-COV-2 Vaccination 10/04/2019, 10/25/2019, 05/08/2020, 11/27/2020   PPD Test 01/28/2020   Pneumococcal Conjugate-13 02/03/2016   Pneumococcal Polysaccharide-23 10/05/2017   Pneumococcal-Unspecified 08/28/2010   Tdap 02/03/2016   Zoster, Live 03/23/2015    TDAP status: Up to date  Flu Vaccine status: Up to date  Pneumococcal vaccine status: Up to date  Covid-19 vaccine status: Completed vaccines  Qualifies  for Shingles Vaccine? Yes   Zostavax completed Yes   Shingrix Completed?: No.    Education has been provided regarding the importance of this vaccine. Patient has been advised to call insurance company to determine out of pocket expense if they have not yet received this  vaccine. Advised may also receive vaccine at local pharmacy or Health Dept. Verbalized acceptance and understanding.  Screening Tests Health Maintenance  Topic Date Due   Zoster Vaccines- Shingrix (1 of 2) Never done   INFLUENZA VACCINE  03/28/2021   COVID-19 Vaccine (5 - Booster for [place] series) 03/29/2021   MAMMOGRAM  12/31/2022   COLONOSCOPY (Pts 45-48yrs Insurance coverage will need to be confirmed)  05/30/2025   TETANUS/TDAP  02/02/2026   DEXA SCAN  Completed   Hepatitis C Screening  Completed   PNA vac Low Risk Adult  Completed   HPV VACCINES  Aged Out    Health Maintenance  Health Maintenance Due  Topic Date Due   Zoster Vaccines- Shingrix (1 of 2) Never done    Colorectal cancer screening: Cologuard completed 02/20/2020. Repeat every 3 years.  Mammogram status: Completed Bilateral 12/30/2020. Repeat every year  Bone Density status: Due-Patient states she will complete with mammogram next year  Lung Cancer Screening: (Low Dose CT Chest recommended if Age 26-80 years, 30 pack-year currently smoking OR have quit w/in 15years.) does not qualify.     Additional Screening:  Hepatitis C Screening:  Completed 10/15/2017  Vision Screening: Recommended annual ophthalmology exams for early detection of glaucoma and other disorders of the eye. Is the patient up to date with their annual eye exam?  Yes  Who is the provider or what is the name of the office in which the patient attends annual eye exams? [facility] Screening: Recommended annual dental exams for proper oral hygiene  Community Resource Referral / Chronic Care Management: CRR required this visit?  No   CCM required this visit?   No      Plan:     I have personally reviewed and noted the following in the patient's chart:   Medical and social history Use of alcohol, tobacco or illicit drugs  Current medications and supplements including opioid prescriptions.  Functional ability and status Nutritional status Physical activity Advanced directives List of other physicians Hospitalizations, surgeries, and ER visits in previous 12 months Vitals Screenings to include cognitive, depression, and falls Referrals and appointments  In addition, I have reviewed and discussed with patient certain preventive protocols, quality metrics, and best practice recommendations. A written personalized care plan for preventive services as well as general preventive health recommendations were provided to patient.   Due to this being a telephonic visit, the after visit summary with patients personalized plan was offered to patient via mail or my-chart. Patient would like to access on my-chart.   Marta Antu, LPN   12/22/621  Nurse Health Advisor  Nurse Notes: None

## 2021-04-14 ENCOUNTER — Ambulatory Visit (INDEPENDENT_AMBULATORY_CARE_PROVIDER_SITE_OTHER): Payer: Medicare Other | Admitting: Family Medicine

## 2021-04-14 ENCOUNTER — Other Ambulatory Visit (HOSPITAL_BASED_OUTPATIENT_CLINIC_OR_DEPARTMENT_OTHER): Payer: Self-pay

## 2021-04-14 ENCOUNTER — Encounter: Payer: Self-pay | Admitting: Family Medicine

## 2021-04-14 ENCOUNTER — Other Ambulatory Visit: Payer: Self-pay

## 2021-04-14 ENCOUNTER — Other Ambulatory Visit (HOSPITAL_COMMUNITY)
Admission: RE | Admit: 2021-04-14 | Discharge: 2021-04-14 | Disposition: A | Discharge status: Home / Self Care | Payer: Medicare Other | Source: Ambulatory Visit | Attending: Family Medicine | Admitting: Family Medicine

## 2021-04-14 VITALS — BP 128/74 | HR 58 | Temp 98.8°F | Resp 16 | Ht 67.0 in | Wt 192.4 lb

## 2021-04-14 DIAGNOSIS — E559 Vitamin D deficiency, unspecified: Secondary | ICD-10-CM | POA: Diagnosis not present

## 2021-04-14 DIAGNOSIS — Z124 Encounter for screening for malignant neoplasm of cervix: Secondary | ICD-10-CM

## 2021-04-14 DIAGNOSIS — Z0000 Encounter for general adult medical examination without abnormal findings: Secondary | ICD-10-CM

## 2021-04-14 DIAGNOSIS — N289 Disorder of kidney and ureter, unspecified: Secondary | ICD-10-CM

## 2021-04-14 DIAGNOSIS — E538 Deficiency of other specified B group vitamins: Secondary | ICD-10-CM | POA: Diagnosis not present

## 2021-04-14 DIAGNOSIS — E039 Hypothyroidism, unspecified: Secondary | ICD-10-CM | POA: Diagnosis not present

## 2021-04-14 DIAGNOSIS — Z862 Personal history of diseases of the blood and blood-forming organs and certain disorders involving the immune mechanism: Secondary | ICD-10-CM | POA: Diagnosis not present

## 2021-04-14 DIAGNOSIS — E782 Mixed hyperlipidemia: Secondary | ICD-10-CM

## 2021-04-14 MED ORDER — AZELASTINE HCL 0.1 % NA SOLN
1.0000 | Freq: Two times a day (BID) | NASAL | 12 refills | Status: DC
  Filled 2021-04-14: qty 30, 25d supply, fill #0

## 2021-04-14 NOTE — Progress Notes (Signed)
Patient ID: Stephanie Pierce, female    DOB: Jan 25, 1951  Age: 70 y.o. MRN: [id number]    Subjective:  Subjective  HPI Stephanie Pierce presents for office visit today for follow up on vitamin D deficiency and hypothyroidism. She reports experiencing right nostril drainage due to allergies. She takes fluticasone nasal spray for her allergies, but stays away from antihistamines due to side effects of drowsiness and sleepiness. Denies CP/palp/SOB/HA/congestion/fevers/GI or GU c/o. Taking meds as prescribed.   Review of Systems  Constitutional:  Negative for chills, fatigue and fever.  HENT:  Positive for postnasal drip and rhinorrhea. Negative for congestion, sinus pressure, sinus pain, sore throat and trouble swallowing.   Eyes:  Negative for pain.  Respiratory:  Negative for cough and shortness of breath.   Cardiovascular:  Negative for chest pain, palpitations and leg swelling.  Gastrointestinal:  Negative for abdominal pain, blood in stool, diarrhea, nausea and vomiting.  Genitourinary:  Negative for decreased urine volume, difficulty urinating, flank pain, frequency, vaginal bleeding, vaginal discharge and vaginal pain.  Musculoskeletal:  Negative for back pain.  Allergic/Immunologic: Positive for environmental allergies.  Neurological:  Negative for headaches.   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   Her family history includes Alcohol abuse in her father; Breast cancer in her sister; Cancer in her sister; Diabetes in her brother and sister; Diverticulosis in her daughter; Heart attack in her father; Hypertension in her father and sister; Other in her brother; Stroke in her brother.She reports that she has never smoked. She has never used smokeless tobacco. She reports that she does not drink alcohol and does not use drugs.  Current Outpatient Medications on File Prior to Visit  Medication Sig Dispense Refill   Cyanocobalamin (B-12) 2500 MCG TABS Take 1 tablet by mouth once a week. 12 tablet 5   fluticasone (FLONASE) 50 MCG/ACT nasal spray USE 2 SPRAYS IN EACH NOSTRIL DAILY 48 g 0   KRILL OIL OMEGA-3 PO Take by mouth daily.      letrozole (FEMARA) 2.5 MG tablet TAKE 1 TABLET BY MOUTH ONCE DAILY 30 tablet 6   Multiple Vitamins-Minerals (CENTRUM SILVER ADULT 50+ PO) Take 1 tablet by mouth every other day.     OVER THE COUNTER MEDICATION Take 1 tablet by mouth daily. CALCIUM 650MG  / VIT D3     SYNTHROID 100 MCG tablet TAKE 1 TABLET DAILY BEFORE BREAKFAST 90 tablet 3   No current facility-administered medications on file prior to visit.     Objective:  Objective  Physical Exam Constitutional:      General: She is not in acute distress.    Appearance: Normal appearance. She is not ill-appearing or  toxic-appearing.  HENT:     Head: Normocephalic and atraumatic.     Right Ear: Tympanic membrane, ear canal and external ear normal.     Left Ear: Tympanic membrane, ear canal and external ear normal.     Nose: No congestion or rhinorrhea.  Eyes:     Extraocular Movements: Extraocular movements intact.     Right eye: No nystagmus.     Left eye: No nystagmus.     Pupils: Pupils are equal, round,  and reactive to light.  Cardiovascular:     Rate and Rhythm: Normal rate and regular rhythm.     Pulses: Normal pulses.     Heart sounds: Normal heart sounds. No murmur heard. Pulmonary:     Effort: Pulmonary effort is normal. No respiratory distress.     Breath sounds: Normal breath sounds. No wheezing, rhonchi or rales.  Abdominal:     General: Bowel sounds are normal.     Palpations: Abdomen is soft. There is no mass.     Tenderness: no abdominal tenderness There is no guarding.     Hernia: No hernia is present.  Genitourinary:    General: Normal vulva.     Vagina: No vaginal discharge.     Rectum: Normal. Guaiac result negative.  Musculoskeletal:        General: Normal range of motion.     Cervical back: Normal range of motion and neck supple.  Skin:    General: Skin is warm and dry.  Neurological:     Mental Status: She is alert and oriented to person, place, and time.     Cranial Nerves: No facial asymmetry.     Motor: Motor function is intact. No weakness.  Psychiatric:        Behavior: Behavior normal.   BP 128/74   Pulse (!) 58   Temp 98.8 F (37.1 C)   Resp 16   Ht 5\' 7"  (1.702 m)   Wt 192 lb 6.4 oz (87.3 kg)   SpO2 99%   BMI 30.13 kg/m  Wt Readings from Last 3 Encounters:  04/14/21 192 lb 6.4 oz (87.3 kg)  03/18/21 188 lb 14.4 oz (85.7 kg)  10/27/20 192 lb 12 oz (87.4 kg)     Lab Results  Component Value Date   WBC 4.5 04/14/2021   HGB 11.7 (L) 04/14/2021   HCT 35.9 (L) 04/14/2021   PLT 177.0 04/14/2021   GLUCOSE 75 04/14/2021   CHOL 285 (H) 04/14/2021   TRIG 44.0 04/14/2021   HDL 137.60 04/14/2021   LDLCALC 139 (H) 04/14/2021   ALT 24 04/14/2021   AST 27 04/14/2021   NA 139 04/14/2021   K 4.5 04/14/2021   CL 100 04/14/2021   CREATININE 2.53 (H) 04/14/2021   BUN 42 (H) 04/14/2021   CO2 29 04/14/2021   TSH 3.47 04/14/2021    MM DIAG BREAST TOMO BILATERAL  Result Date: 12/30/2020 CLINICAL DATA:  70 year old female presenting for annual  exam. History of right breast cancer in 2015 with recurrence in 2020. No new problems. EXAM: DIGITAL DIAGNOSTIC BILATERAL MAMMOGRAM WITH TOMOSYNTHESIS AND CAD TECHNIQUE: Bilateral digital diagnostic mammography and breast tomosynthesis was performed. The images were evaluated with computer-aided detection. COMPARISON:  Previous exam(s). ACR Breast Density Category b: There are scattered areas of fibroglandular density. FINDINGS: Right breast: A spot 2D magnification view of the lumpectomy site was performed in addition to standard views. There are stable postsurgical changes. No suspicious mass, distortion, or microcalcifications are identified to suggest  presence of malignancy. Left breast: No suspicious mass, distortion, or microcalcifications are identified to suggest presence of malignancy. IMPRESSION: Stable postsurgical changes in the right breast. No mammographic evidence of malignancy bilaterally. RECOMMENDATION: Diagnostic bilateral mammogram in 1 year. I have discussed the findings and recommendations with the patient. If applicable, a reminder letter will be sent to the patient regarding the next appointment. BI-RADS CATEGORY  2: Benign. Electronically Signed   By: Audie Pinto M.D.   On: 12/30/2020 08:21     Assessment & Plan:  Plan    Meds ordered this encounter  Medications   azelastine (ASTELIN) 0.1 % nasal spray    Sig: Place 1-2 sprays into both nostrils 2 (two) times daily. Use in each nostril as directed    Dispense:  30 mL    Refill:  12    Problem List Items Addressed This Visit     Vitamin D deficiency (Chronic)    Supplement and monitor      Relevant Orders   Iron, TIBC and Ferritin Panel (Completed)   Vitamin D 1,25 dihydroxy   Hyperlipidemia, mixed    Encourage heart healthy diet such as MIND or DASH diet, increase exercise, avoid trans fats, simple carbohydrates and processed foods, consider a krill or fish or flaxseed oil cap daily.       Relevant Orders    CBC with Differential/Platelet (Completed)   Comprehensive metabolic panel (Completed)   Lipid panel (Completed)   Renal insufficiency    Increase hydration, cut Losartan to 50 mg daily and recheck cmp and BP next week. referred to nephrology for further consideration      Hypothyroidism    On Levothyroxine, continue to monitor      Relevant Orders   TSH (Completed)   H/O iron deficiency anemia   Relevant Orders   CBC with Differential/Platelet (Completed)   Comprehensive metabolic panel (Completed)   Lipid panel (Completed)   Iron, TIBC and Ferritin Panel (Completed)   TSH (Completed)   Vitamin D 1,25 dihydroxy   Vitamin B12 (Completed)   Preventative health care   Relevant Orders   CBC with Differential/Platelet (Completed)   Comprehensive metabolic panel (Completed)   Lipid panel (Completed)   Iron, TIBC and Ferritin Panel (Completed)   TSH (Completed)   Vitamin D 1,25 dihydroxy   Vitamin B12 (Completed)   Vitamin B12 deficiency - Primary    Supplement and monitor      Relevant Orders   Iron, TIBC and Ferritin Panel (Completed)   Vitamin B12 (Completed)   Cervical cancer screening    Pap today, no concerns on exam.       Relevant Orders   Cytology - PAP( [facility])    Follow-up: Return in about 1 year (around 04/14/2022) for annual exam.  I, Suezanne Jacquet, acting as a scribe for Penni Homans, MD, have documented all relevent documentation on behalf of Penni Homans, MD, as directed by Penni Homans, MD while in the presence of Penni Homans, MD.  I, Mosie Lukes, MD personally performed the services described in this documentation. All medical record entries made by the scribe were at my direction and in my presence. I have reviewed the chart and agree that the record reflects my personal performance and is accurate and complete

## 2021-04-14 NOTE — Patient Instructions (Signed)
Paxlovid is the new COVID medication we can give you if you get COVID so make sure you test if you have symptoms because we have to treat by day 5 of symptoms for it to be effective. If you are positive let us know so we can treat. If a home test is negative and your symptoms are persistent get a PCR test. Can check testing locations at [email or web address] If you are positive we will make an appointment with us and we will send in Paxlovid if you would like it. Check with your pharmacy before we meet to confirm they have it in stock, if they do not then we can get the prescription at the [facility]     Preventive Care 65 Years and Older, Female Preventive care refers to lifestyle choices and visits with your health care provider that can promote health and wellness. This includes: A yearly physical exam. This is also called an annual wellness visit. Regular dental and eye exams. Immunizations. Screening for certain conditions. Healthy lifestyle choices, such as: Eating a healthy diet. Getting regular exercise. Not using drugs or products that contain nicotine and tobacco. Limiting alcohol use. What can I expect for my preventive care visit? Physical exam Your health care provider will check your: Height and weight. These may be used to calculate your BMI (body mass index). BMI is a measurement that tells if you are at a healthy weight. Heart rate and blood pressure. Body temperature. Skin for abnormal spots. Counseling Your health care provider may ask you questions about your: Past medical problems. Family's medical history. Alcohol, tobacco, and drug use. Emotional well-being. Home life and relationship well-being. Sexual activity. Diet, exercise, and sleep habits. History of falls. Memory and ability to understand (cognition). Work and work environment. Pregnancy and menstrual history. Access to firearms. What immunizations do I need?  Vaccines are usually  given at various ages, according to a schedule. Your health care provider will recommend vaccines for you based on your age, medicalhistory, and lifestyle or other factors, such as travel or where you work. What tests do I need? Blood tests Lipid and cholesterol levels. These may be checked every 5 years, or more often depending on your overall health. Hepatitis C test. Hepatitis B test. Screening Lung cancer screening. You may have this screening every year starting at age 55 if you have a 30-pack-year history of smoking and currently smoke or have quit within the past 15 years. Colorectal cancer screening. All adults should have this screening starting at age 50 and continuing until age 75. Your health care provider may recommend screening at age 45 if you are at increased risk. You will have tests every 1-10 years, depending on your results and the type of screening test. Diabetes screening. This is done by checking your blood sugar (glucose) after you have not eaten for a while (fasting). You may have this done every 1-3 years. Mammogram. This may be done every 1-2 years. Talk with your health care provider about how often you should have regular mammograms. Abdominal aortic aneurysm (AAA) screening. You may need this if you are a current or former smoker. BRCA-related cancer screening. This may be done if you have a family history of breast, ovarian, tubal, or peritoneal cancers. Other tests STD (sexually transmitted disease) testing, if you are at risk. Bone density scan. This is done to screen for osteoporosis. You may have this done starting at age 65. Talk with your health care provider about your   test results, treatment options,and if necessary, the need for more tests. Follow these instructions at home: Eating and drinking  Eat a diet that includes fresh fruits and vegetables, whole grains, lean protein, and low-fat dairy products. Limit your intake of foods with high amounts of  sugar, saturated fats, and salt. Take vitamin and mineral supplements as recommended by your health care provider. Do not drink alcohol if your health care provider tells you not to drink. If you drink alcohol: Limit how much you have to 0-1 drink a day. Be aware of how much alcohol is in your drink. In the [place] one drink equals one 12 oz bottle of beer (355 mL), one 5 oz glass of wine (148 mL), or one 1 oz glass of hard liquor (44 mL).  Lifestyle Take daily care of your teeth and gums. Brush your teeth every morning and night with fluoride toothpaste. Floss one time each day. Stay active. Exercise for at least 30 minutes 5 or more days each week. Do not use any products that contain nicotine or tobacco, such as cigarettes, e-cigarettes, and chewing tobacco. If you need help quitting, ask your health care provider. Do not use drugs. If you are sexually active, practice safe sex. Use a condom or other form of protection in order to prevent STIs (sexually transmitted infections). Talk with your health care provider about taking a low-dose aspirin or statin. Find healthy ways to cope with stress, such as: Meditation, yoga, or listening to music. Journaling. Talking to a trusted person. Spending time with friends and family. Safety Always wear your seat belt while driving or riding in a vehicle. Do not drive: If you have been drinking alcohol. Do not ride with someone who has been drinking. When you are tired or distracted. While texting. Wear a helmet and other protective equipment during sports activities. If you have firearms in your house, make sure you follow all gun safety procedures. What's next? Visit your health care provider once a year for an annual wellness visit. Ask your health care provider how often you should have your eyes and teeth checked. Stay up to date on all vaccines. This information is not intended to replace advice given to you by your health care provider.  Make sure you discuss any questions you have with your healthcare provider. Document Revised: 08/04/2020 Document Reviewed: 08/08/2018 Elsevier Patient Education  2022 Elsevier Inc.  

## 2021-04-14 NOTE — Assessment & Plan Note (Signed)
Pap today, no concerns on exam.  

## 2021-04-15 ENCOUNTER — Encounter: Payer: Self-pay | Admitting: Family Medicine

## 2021-04-15 ENCOUNTER — Other Ambulatory Visit: Payer: Self-pay | Admitting: Family Medicine

## 2021-04-15 ENCOUNTER — Other Ambulatory Visit (HOSPITAL_BASED_OUTPATIENT_CLINIC_OR_DEPARTMENT_OTHER): Payer: Self-pay

## 2021-04-15 DIAGNOSIS — N289 Disorder of kidney and ureter, unspecified: Secondary | ICD-10-CM

## 2021-04-15 LAB — COMPREHENSIVE METABOLIC PANEL
ALT: 24 U/L (ref 0–35)
AST: 27 U/L (ref 0–37)
Albumin: 4.2 g/dL (ref 3.5–5.2)
Alkaline Phosphatase: 81 U/L (ref 39–117)
BUN: 42 mg/dL — ABNORMAL HIGH (ref 6–23)
CO2: 29 mEq/L (ref 19–32)
Calcium: 10 mg/dL (ref 8.4–10.5)
Chloride: 100 mEq/L (ref 96–112)
Creatinine, Ser: 2.53 mg/dL — ABNORMAL HIGH (ref 0.40–1.20)
GFR: 18.93 mL/min — ABNORMAL LOW (ref >60.00)
Glucose, Bld: 75 mg/dL (ref 70–99)
Potassium: 4.5 mEq/L (ref 3.5–5.1)
Sodium: 139 mEq/L (ref 135–145)
Total Bilirubin: 0.8 mg/dL (ref 0.2–1.2)
Total Protein: 8.1 g/dL (ref 6.0–8.3)

## 2021-04-15 LAB — LIPID PANEL
Cholesterol: 285 mg/dL — ABNORMAL HIGH (ref 0–200)
HDL: 137.6 mg/dL (ref >39.00)
LDL Cholesterol: 139 mg/dL — ABNORMAL HIGH (ref 0–99)
NonHDL: 147.67
Total CHOL/HDL Ratio: 2
Triglycerides: 44 mg/dL (ref 0.0–149.0)
VLDL: 8.8 mg/dL (ref 0.0–40.0)

## 2021-04-15 LAB — CBC WITH DIFFERENTIAL/PLATELET
Basophils Absolute: 0.1 10*3/uL (ref 0.0–0.1)
Basophils Relative: 1.9 % (ref 0.0–3.0)
Eosinophils Absolute: 0.2 10*3/uL (ref 0.0–0.7)
Eosinophils Relative: 4.3 % (ref 0.0–5.0)
HCT: 35.9 % — ABNORMAL LOW (ref 36.0–46.0)
Hemoglobin: 11.7 g/dL — ABNORMAL LOW (ref 12.0–15.0)
Lymphocytes Relative: 32.1 % (ref 12.0–46.0)
Lymphs Abs: 1.4 10*3/uL (ref 0.7–4.0)
MCHC: 32.7 g/dL (ref 30.0–36.0)
MCV: 86.9 fl (ref 78.0–100.0)
Monocytes Absolute: 0.5 10*3/uL (ref 0.1–1.0)
Monocytes Relative: 11.3 % (ref 3.0–12.0)
Neutro Abs: 2.3 10*3/uL (ref 1.4–7.7)
Neutrophils Relative %: 50.4 % (ref 43.0–77.0)
Platelets: 177 10*3/uL (ref 150.0–400.0)
RBC: 4.13 Mil/uL (ref 3.87–5.11)
RDW: 14.5 % (ref 11.5–15.5)
WBC: 4.5 10*3/uL (ref 4.0–10.5)

## 2021-04-15 LAB — VITAMIN B12: Vitamin B-12: 774 pg/mL (ref 211–911)

## 2021-04-15 LAB — TSH: TSH: 3.47 u[IU]/mL (ref 0.35–5.50)

## 2021-04-15 MED ORDER — LOSARTAN POTASSIUM 100 MG PO TABS: 50.0000 mg | ORAL_TABLET | Freq: Every day | ORAL | 3 refills | Status: DC

## 2021-04-15 MED ORDER — ROSUVASTATIN CALCIUM 5 MG PO TABS
5.0000 mg | ORAL_TABLET | Freq: Every day | ORAL | 1 refills | Status: DC
  Filled 2021-04-15: qty 90, 90d supply, fill #0

## 2021-04-17 NOTE — Assessment & Plan Note (Signed)
Supplement and monitor 

## 2021-04-17 NOTE — Assessment & Plan Note (Signed)
Encourage heart healthy diet such as MIND or DASH diet, increase exercise, avoid trans fats, simple carbohydrates and processed foods, consider a krill or fish or flaxseed oil cap daily.  °

## 2021-04-17 NOTE — Assessment & Plan Note (Signed)
On Levothyroxine, continue to monitor 

## 2021-04-17 NOTE — Assessment & Plan Note (Addendum)
Increase hydration, cut Losartan to 50 mg daily and recheck cmp and BP next week. referred to nephrology for further consideration

## 2021-04-18 ENCOUNTER — Encounter: Payer: Self-pay | Admitting: Hematology & Oncology

## 2021-04-18 ENCOUNTER — Encounter: Payer: Self-pay | Admitting: Family Medicine

## 2021-04-18 ENCOUNTER — Telehealth: Payer: Self-pay

## 2021-04-18 ENCOUNTER — Other Ambulatory Visit: Payer: Self-pay | Admitting: Family Medicine

## 2021-04-18 DIAGNOSIS — N289 Disorder of kidney and ureter, unspecified: Secondary | ICD-10-CM

## 2021-04-18 DIAGNOSIS — Z9889 Other specified postprocedural states: Secondary | ICD-10-CM

## 2021-04-18 DIAGNOSIS — E782 Mixed hyperlipidemia: Secondary | ICD-10-CM

## 2021-04-18 DIAGNOSIS — E669 Obesity, unspecified: Secondary | ICD-10-CM

## 2021-04-18 LAB — VITAMIN D 1,25 DIHYDROXY
Vitamin D 1, 25 (OH)2 Total: 31 pg/mL (ref 18–72)
Vitamin D2 1, 25 (OH)2: <8 pg/mL
Vitamin D3 1, 25 (OH)2: 31 pg/mL

## 2021-04-18 LAB — CYTOLOGY - PAP
Chlamydia: NEGATIVE
Comment: NEGATIVE
Comment: NEGATIVE
Comment: NORMAL
Diagnosis: NEGATIVE
Neisseria Gonorrhea: NEGATIVE
Trichomonas: NEGATIVE

## 2021-04-18 LAB — IRON,TIBC AND FERRITIN PANEL
%SAT: 23 % (calc) (ref 16–45)
Ferritin: 57 ng/mL (ref 16–288)
Iron: 86 ug/dL (ref 45–160)
TIBC: 368 mcg/dL (calc) (ref 250–450)

## 2021-04-18 NOTE — Telephone Encounter (Signed)
FYI- spoke with pt and she states that she will do her own bp checks and post them to her mychart

## 2021-04-21 ENCOUNTER — Encounter: Payer: Self-pay | Admitting: Family Medicine

## 2021-04-25 ENCOUNTER — Other Ambulatory Visit (HOSPITAL_BASED_OUTPATIENT_CLINIC_OR_DEPARTMENT_OTHER): Payer: Self-pay

## 2021-04-26 ENCOUNTER — Encounter: Payer: Self-pay | Admitting: Family Medicine

## 2021-04-28 ENCOUNTER — Inpatient Hospital Stay: Payer: Medicare Other | Attending: Hematology & Oncology

## 2021-04-28 ENCOUNTER — Other Ambulatory Visit: Payer: Self-pay | Admitting: Family Medicine

## 2021-04-28 ENCOUNTER — Other Ambulatory Visit (HOSPITAL_BASED_OUTPATIENT_CLINIC_OR_DEPARTMENT_OTHER): Payer: Self-pay

## 2021-04-28 ENCOUNTER — Encounter: Payer: Self-pay | Admitting: Family Medicine

## 2021-04-28 ENCOUNTER — Telehealth: Payer: Self-pay

## 2021-04-28 ENCOUNTER — Encounter: Payer: Self-pay | Admitting: Hematology & Oncology

## 2021-04-28 ENCOUNTER — Ambulatory Visit: Payer: Medicare Other | Admitting: Family

## 2021-04-28 ENCOUNTER — Inpatient Hospital Stay (HOSPITAL_BASED_OUTPATIENT_CLINIC_OR_DEPARTMENT_OTHER): Payer: Medicare Other | Admitting: Hematology & Oncology

## 2021-04-28 ENCOUNTER — Other Ambulatory Visit: Payer: Self-pay

## 2021-04-28 VITALS — BP 138/93 | HR 66 | Temp 98.3°F | Resp 20 | Wt 188.1 lb

## 2021-04-28 DIAGNOSIS — D0510 Intraductal carcinoma in situ of unspecified breast: Secondary | ICD-10-CM

## 2021-04-28 DIAGNOSIS — D649 Anemia, unspecified: Secondary | ICD-10-CM | POA: Diagnosis not present

## 2021-04-28 DIAGNOSIS — R918 Other nonspecific abnormal finding of lung field: Secondary | ICD-10-CM | POA: Insufficient documentation

## 2021-04-28 DIAGNOSIS — D0511 Intraductal carcinoma in situ of right breast: Secondary | ICD-10-CM | POA: Diagnosis not present

## 2021-04-28 DIAGNOSIS — Z862 Personal history of diseases of the blood and blood-forming organs and certain disorders involving the immune mechanism: Secondary | ICD-10-CM | POA: Diagnosis not present

## 2021-04-28 DIAGNOSIS — D500 Iron deficiency anemia secondary to blood loss (chronic): Secondary | ICD-10-CM

## 2021-04-28 LAB — CMP (CANCER CENTER ONLY)
ALT: 18 U/L (ref 0–44)
AST: 26 U/L (ref 15–41)
Albumin: 4.1 g/dL (ref 3.5–5.0)
Alkaline Phosphatase: 74 U/L (ref 38–126)
Anion gap: 8 (ref 5–15)
BUN: 23 mg/dL (ref 8–23)
CO2: 30 mmol/L (ref 22–32)
Calcium: 10 mg/dL (ref 8.9–10.3)
Chloride: 103 mmol/L (ref 98–111)
Creatinine: 2.2 mg/dL — ABNORMAL HIGH (ref 0.44–1.00)
GFR, Estimated: 24 mL/min — ABNORMAL LOW (ref >60)
Glucose, Bld: 86 mg/dL (ref 70–99)
Potassium: 4.3 mmol/L (ref 3.5–5.1)
Sodium: 141 mmol/L (ref 135–145)
Total Bilirubin: 0.8 mg/dL (ref 0.3–1.2)
Total Protein: 7.8 g/dL (ref 6.5–8.1)

## 2021-04-28 LAB — CBC WITH DIFFERENTIAL (CANCER CENTER ONLY)
Abs Immature Granulocytes: 0.03 10*3/uL (ref 0.00–0.07)
Basophils Absolute: 0.1 10*3/uL (ref 0.0–0.1)
Basophils Relative: 1 %
Eosinophils Absolute: 0.1 10*3/uL (ref 0.0–0.5)
Eosinophils Relative: 3 %
HCT: 36.5 % (ref 36.0–46.0)
Hemoglobin: 11.8 g/dL — ABNORMAL LOW (ref 12.0–15.0)
Immature Granulocytes: 1 %
Lymphocytes Relative: 36 %
Lymphs Abs: 1.5 10*3/uL (ref 0.7–4.0)
MCH: 28.1 pg (ref 26.0–34.0)
MCHC: 32.3 g/dL (ref 30.0–36.0)
MCV: 86.9 fL (ref 80.0–100.0)
Monocytes Absolute: 0.5 10*3/uL (ref 0.1–1.0)
Monocytes Relative: 12 %
Neutro Abs: 2 10*3/uL (ref 1.7–7.7)
Neutrophils Relative %: 47 %
Platelet Count: 166 10*3/uL (ref 150–400)
RBC: 4.2 MIL/uL (ref 3.87–5.11)
RDW: 13.2 % (ref 11.5–15.5)
WBC Count: 4.1 10*3/uL (ref 4.0–10.5)
nRBC: 0 % (ref 0.0–0.2)

## 2021-04-28 LAB — IRON AND TIBC
Iron: 55 ug/dL (ref 41–142)
Saturation Ratios: 17 % — ABNORMAL LOW (ref 21–57)
TIBC: 326 ug/dL (ref 236–444)
UIBC: 271 ug/dL (ref 120–384)

## 2021-04-28 LAB — RETICULOCYTES
Immature Retic Fract: 4.9 % (ref 2.3–15.9)
RBC.: 4.19 MIL/uL (ref 3.87–5.11)
Retic Count, Absolute: 36.9 10*3/uL (ref 19.0–186.0)
Retic Ct Pct: 0.9 % (ref 0.4–3.1)

## 2021-04-28 LAB — FERRITIN: Ferritin: 78 ng/mL (ref 11–307)

## 2021-04-28 MED ORDER — LOSARTAN POTASSIUM 50 MG PO TABS
50.0000 mg | ORAL_TABLET | Freq: Every day | ORAL | 1 refills | Status: DC
  Filled 2021-04-28: qty 90, 90d supply, fill #0
  Filled 2021-08-15: qty 90, 90d supply, fill #1

## 2021-04-28 NOTE — Progress Notes (Signed)
Hematology and Oncology Follow Up Visit  Jamilee Lafosse [id number] May 30, 1951 70 y.o. 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 daily   Interim History: Ms. Beckstrand is here today for follow-up.  We see her every 6 months.  She had been quite busy.  She is working.  She now has her PhD.  She has had some problems with her kidneys.  She has mild renal insufficiency.  Her family doctor is following this quite closely.  She has little bit of anemia.  Not sure the anemia is secondary to the renal insufficiency.  We did check an erythropoietin level on her.  This was done last year.  It was only 14.    She had iron studies done couple weeks ago.  Her ferritin was 57 with an iron saturation of 23%.  She is eating well.  She is having no problems with nausea or vomiting.  There is no cough.  She has had no issues with COVID.    Her last mammogram was back in May 2022.  Everything looked fine with the mammogram.  Overall, I would say performance status is ECOG 0.    Medications:  Allergies as of 04/28/2021       Reactions   Lisinopril Swelling   Swelling in the throat   Iodine Hives   Penicillins Rash        Medication List        Accurate as of April 28, 2021  9:21 AM. If you have any questions, ask your nurse or doctor.          Azelastine HCl 137 MCG/SPRAY Soln Place 1-2 sprays into both nostrils 2 (two) times daily. Use in each nostril as directed   B-12 2500 MCG Tabs Take 1 tablet by mouth once a week.   CENTRUM SILVER ADULT 50+ PO Take 1 tablet by mouth every other day.   fluticasone 50 MCG/ACT nasal spray Commonly known as: FLONASE USE 2 SPRAYS IN EACH NOSTRIL DAILY   KRILL OIL OMEGA-3 PO Take by mouth daily.   letrozole 2.5 MG tablet Commonly known as: FEMARA TAKE 1 TABLET BY MOUTH ONCE DAILY   losartan 100 MG tablet Commonly  known as: COZAAR Take 0.5 tablets (50 mg total) by mouth daily.   OVER THE COUNTER MEDICATION Take 1 tablet by mouth daily. CALCIUM 650MG  / VIT D3   rosuvastatin 5 MG tablet Commonly known as: Crestor Take 1 tablet (5 mg total) by mouth daily.   Synthroid 100 MCG tablet Generic drug: levothyroxine TAKE 1 TABLET DAILY BEFORE BREAKFAST        Allergies:  Allergies  Allergen Reactions   Lisinopril Swelling    Swelling in the throat   Iodine Hives   Penicillins Rash    Past Medical History, Surgical history, Social history, and Family History were reviewed and updated.  Review of Systems: Review of Systems  Constitutional: Negative.   HENT: Negative.    Eyes: Negative.   Respiratory: Negative.    Cardiovascular: Negative.   Gastrointestinal: Negative.   Genitourinary: Negative.   Musculoskeletal: Negative.   Skin: Negative.   Neurological: Negative.   Endo/Heme/Allergies: Negative.   Psychiatric/Behavioral: Negative.      Physical Exam:  weight is 188 lb 1.3 oz (85.3 kg). Her oral temperature is 98.3 F (36.8 C). Her blood pressure is 138/93 (abnormal) and her pulse  is 66. Her respiration is 20 and oxygen saturation is 98%.   Wt Readings from Last 3 Encounters:  04/28/21 188 lb 1.3 oz (85.3 kg)  04/14/21 192 lb 6.4 oz (87.3 kg)  03/18/21 188 lb 14.4 oz (85.7 kg)    Physical Exam Vitals reviewed.  Constitutional:      Comments: Her breast exam shows the right breast with the lumpectomy that is well-healed.  This is about the 10 o'clock position.  No obvious nodularity is noted in the right breast.  There is no right axillary adenopathy.  Left breast is unremarkable.  There is no left axillary adenopathy.  HENT:     Head: Normocephalic and atraumatic.  Eyes:     Pupils: Pupils are equal, round, and reactive to light.  Cardiovascular:     Rate and Rhythm: Normal rate and regular rhythm.     Heart sounds: Normal heart sounds.  Pulmonary:     Effort:  Pulmonary effort is normal.     Breath sounds: Normal breath sounds.  Abdominal:     General: Bowel sounds are normal.     Palpations: Abdomen is soft.  Musculoskeletal:        General: No tenderness or deformity. Normal range of motion.     Cervical back: Normal range of motion.  Lymphadenopathy:     Cervical: No cervical adenopathy.  Skin:    General: Skin is warm and dry.     Findings: No erythema or rash.  Neurological:     Mental Status: She is alert and oriented to person, place, and time.  Psychiatric:        Behavior: Behavior normal.        Thought Content: Thought content normal.        Judgment: Judgment normal.     Lab Results  Component Value Date   WBC 4.1 04/28/2021   HGB 11.8 (L) 04/28/2021   HCT 36.5 04/28/2021   MCV 86.9 04/28/2021   PLT 166 04/28/2021   Lab Results  Component Value Date   FERRITIN 57 04/14/2021   IRON 86 04/14/2021   TIBC 368 04/14/2021   UIBC 276 10/27/2020   IRONPCTSAT 23 04/14/2021   Lab Results  Component Value Date   RETICCTPCT 0.9 04/28/2021   RBC 4.20 04/28/2021   RBC 4.19 04/28/2021   No results found for: KPAFRELGTCHN, LAMBDASER, KAPLAMBRATIO No results found for: IGGSERUM, IGA, IGMSERUM No results found for: Odetta Pink, SPEI   Chemistry      Component Value Date/Time   NA 141 04/28/2021 0836   NA 147 (H) 06/08/2017 0748   NA 140 12/07/2016 0836   K 4.3 04/28/2021 0836   K 4.1 06/08/2017 0748   K 4.5 12/07/2016 0836   CL 103 04/28/2021 0836   CL 109 (H) 06/08/2017 0748   CO2 30 04/28/2021 0836   CO2 29 06/08/2017 0748   CO2 28 12/07/2016 0836   BUN 23 04/28/2021 0836   BUN 39 (H) 06/08/2017 0748   BUN 34.7 (H) 12/07/2016 0836   CREATININE 2.20 (H) 04/28/2021 0836   CREATININE 2.1 (H) 06/08/2017 0748   CREATININE 2.2 (H) 12/07/2016 0836      Component Value Date/Time   CALCIUM 10.0 04/28/2021 0836   CALCIUM 9.3 06/08/2017 0748   CALCIUM 9.3  12/07/2016 0836   ALKPHOS 74 04/28/2021 0836   ALKPHOS 69 06/08/2017 0748   ALKPHOS 73 12/07/2016 0836   AST 26 04/28/2021 0836  AST 23 12/07/2016 0836   ALT 18 04/28/2021 0836   ALT 27 06/08/2017 0748   ALT 21 12/07/2016 0836   BILITOT 0.8 04/28/2021 0836   BILITOT 0.55 12/07/2016 0836     Impression and Plan: Ms. Mulkern is a 70 yo African American female with DCIS of the right breast diagnosed in July 2015 with lumpectomy.  She also had radiation therapy after the lumpectomy.  She is doing well on the Femara.  We will keep her on Femara for right now.  We will plan to get her back in 6 months.  I think this is reasonable.  Her renal function is being monitored by her family doctor.  I am just happy that she has a good quality of life.  She has been quite busy.          Volanda Napoleon, MD 9/1/20229:21 AM

## 2021-04-29 ENCOUNTER — Encounter: Payer: Self-pay | Admitting: Hematology & Oncology

## 2021-04-29 ENCOUNTER — Other Ambulatory Visit: Payer: Self-pay | Admitting: *Deleted

## 2021-04-29 ENCOUNTER — Other Ambulatory Visit (HOSPITAL_BASED_OUTPATIENT_CLINIC_OR_DEPARTMENT_OTHER): Payer: Self-pay

## 2021-04-29 ENCOUNTER — Telehealth: Payer: Self-pay | Admitting: *Deleted

## 2021-04-29 ENCOUNTER — Encounter: Payer: Self-pay | Admitting: Family Medicine

## 2021-04-29 MED ORDER — FUSION PLUS PO CAPS
1.0000 | ORAL_CAPSULE | Freq: Every day | ORAL | 3 refills | Status: DC
  Filled 2021-04-29: qty 30, 30d supply, fill #0
  Filled 2021-05-27: qty 30, 30d supply, fill #1
  Filled 2021-07-01: qty 30, 30d supply, fill #2
  Filled 2021-08-15 – 2021-08-16 (×2): qty 30, 30d supply, fill #3

## 2021-04-29 NOTE — Telephone Encounter (Signed)
Notified pt of results. Pt will need 1 dose of IV Iron. Pt requested to try OTC Iron prior to IV Iron. Pt currently has [facility] Iron 28mg . She would like to try OTC iron prior to IV Iron. Will review with MD.

## 2021-04-29 NOTE — Telephone Encounter (Signed)
-----   Message from Volanda Napoleon, MD sent at 04/29/2021  8:57 AM EDT ----- Call and let her know that the iron level is low.  We probably need to give her a dose of IV iron.  Laurey Arrow

## 2021-05-07 ENCOUNTER — Encounter: Payer: Self-pay | Admitting: Family Medicine

## 2021-05-08 ENCOUNTER — Other Ambulatory Visit: Payer: Self-pay | Admitting: Hematology & Oncology

## 2021-05-08 DIAGNOSIS — D0510 Intraductal carcinoma in situ of unspecified breast: Secondary | ICD-10-CM

## 2021-05-09 ENCOUNTER — Encounter: Payer: Self-pay | Admitting: Hematology & Oncology

## 2021-05-09 ENCOUNTER — Other Ambulatory Visit: Payer: Self-pay | Admitting: Hematology & Oncology

## 2021-05-09 ENCOUNTER — Other Ambulatory Visit (HOSPITAL_BASED_OUTPATIENT_CLINIC_OR_DEPARTMENT_OTHER): Payer: Self-pay

## 2021-05-09 ENCOUNTER — Other Ambulatory Visit: Payer: Self-pay

## 2021-05-09 DIAGNOSIS — D0511 Intraductal carcinoma in situ of right breast: Secondary | ICD-10-CM

## 2021-05-09 DIAGNOSIS — D0510 Intraductal carcinoma in situ of unspecified breast: Secondary | ICD-10-CM

## 2021-05-09 MED ORDER — LETROZOLE 2.5 MG PO TABS
2.5000 mg | ORAL_TABLET | Freq: Every day | ORAL | 0 refills | Status: AC
  Filled 2021-05-09: qty 12, 12d supply, fill #0

## 2021-05-09 MED ORDER — FLUTICASONE PROPIONATE 50 MCG/ACT NA SUSP: 2.0000 | Freq: Every day | NASAL | 1 refills | Status: AC

## 2021-05-09 MED ORDER — LETROZOLE 2.5 MG PO TABS
ORAL_TABLET | Freq: Every day | ORAL | 6 refills | Status: DC
  Filled 2021-05-09 – 2022-04-18 (×2): qty 30, 30d supply, fill #0

## 2021-05-27 ENCOUNTER — Other Ambulatory Visit (HOSPITAL_BASED_OUTPATIENT_CLINIC_OR_DEPARTMENT_OTHER): Payer: Self-pay

## 2021-05-31 ENCOUNTER — Encounter: Payer: Self-pay | Admitting: Family Medicine

## 2021-05-31 ENCOUNTER — Ambulatory Visit: Payer: Medicare Other | Attending: Internal Medicine

## 2021-05-31 ENCOUNTER — Other Ambulatory Visit: Payer: Self-pay | Admitting: Family Medicine

## 2021-05-31 DIAGNOSIS — N184 Chronic kidney disease, stage 4 (severe): Secondary | ICD-10-CM | POA: Diagnosis not present

## 2021-05-31 DIAGNOSIS — N289 Disorder of kidney and ureter, unspecified: Secondary | ICD-10-CM

## 2021-05-31 DIAGNOSIS — E559 Vitamin D deficiency, unspecified: Secondary | ICD-10-CM | POA: Diagnosis not present

## 2021-05-31 DIAGNOSIS — Z903 Acquired absence of stomach [part of]: Secondary | ICD-10-CM | POA: Diagnosis not present

## 2021-05-31 DIAGNOSIS — R030 Elevated blood-pressure reading, without diagnosis of hypertension: Secondary | ICD-10-CM | POA: Diagnosis not present

## 2021-05-31 DIAGNOSIS — D0511 Intraductal carcinoma in situ of right breast: Secondary | ICD-10-CM | POA: Diagnosis not present

## 2021-05-31 DIAGNOSIS — Z23 Encounter for immunization: Secondary | ICD-10-CM

## 2021-05-31 DIAGNOSIS — R809 Proteinuria, unspecified: Secondary | ICD-10-CM | POA: Diagnosis not present

## 2021-05-31 DIAGNOSIS — D631 Anemia in chronic kidney disease: Secondary | ICD-10-CM | POA: Diagnosis not present

## 2021-05-31 LAB — BASIC METABOLIC PANEL
BUN: 20 (ref 4–21)
CO2: 30 — AB (ref 13–22)
Chloride: 103 (ref 99–108)
Creatinine: 1.9 — AB (ref <=1.1)
Glucose: 93
Potassium: 4.2 (ref 3.4–5.3)
Sodium: 141 (ref 137–147)

## 2021-05-31 LAB — COMPREHENSIVE METABOLIC PANEL
Albumin: 4.5 (ref 3.5–5.0)
Calcium: 10.1 (ref 8.7–10.7)

## 2021-05-31 NOTE — Progress Notes (Signed)
   Covid-19 Vaccination Clinic  Name:  Stephanie Pierce    MRN: [id number] DOB: 03-10-1951  05/31/2021  [name] was observed post Covid-19 immunization for 15 minutes without incident. She was provided with Vaccine Information Sheet and instruction to access the V-Safe system.   [name] was instructed to call 911 with any severe reactions post vaccine: Difficulty breathing  Swelling of face and throat  A fast heartbeat  A bad rash all over body  Dizziness and weakness

## 2021-06-01 ENCOUNTER — Other Ambulatory Visit: Payer: Self-pay | Admitting: Nephrology

## 2021-06-01 DIAGNOSIS — N184 Chronic kidney disease, stage 4 (severe): Secondary | ICD-10-CM

## 2021-06-03 ENCOUNTER — Other Ambulatory Visit (HOSPITAL_BASED_OUTPATIENT_CLINIC_OR_DEPARTMENT_OTHER): Payer: Self-pay

## 2021-06-03 MED ORDER — LOSARTAN POTASSIUM 25 MG PO TABS
25.0000 mg | ORAL_TABLET | Freq: Every day | ORAL | 11 refills | Status: DC
  Filled 2021-06-03: qty 30, 30d supply, fill #0

## 2021-06-10 ENCOUNTER — Ambulatory Visit
Admission: RE | Admit: 2021-06-10 | Discharge: 2021-06-10 | Disposition: A | Discharge status: Home / Self Care | Payer: Medicare Other | Source: Ambulatory Visit | Attending: Nephrology | Admitting: Nephrology

## 2021-06-10 ENCOUNTER — Other Ambulatory Visit (HOSPITAL_BASED_OUTPATIENT_CLINIC_OR_DEPARTMENT_OTHER): Payer: Self-pay

## 2021-06-10 DIAGNOSIS — N184 Chronic kidney disease, stage 4 (severe): Secondary | ICD-10-CM

## 2021-06-10 DIAGNOSIS — Z23 Encounter for immunization: Secondary | ICD-10-CM | POA: Diagnosis not present

## 2021-06-10 MED ORDER — COVID-19MRNA BIVAL VACC PFIZER 30 MCG/0.3ML IM SUSP
INTRAMUSCULAR | 0 refills | Status: DC
  Filled 2021-06-10: qty 0.3, 1d supply, fill #0

## 2021-06-14 ENCOUNTER — Encounter: Payer: Self-pay | Admitting: Family Medicine

## 2021-06-21 ENCOUNTER — Other Ambulatory Visit (HOSPITAL_BASED_OUTPATIENT_CLINIC_OR_DEPARTMENT_OTHER): Payer: Self-pay

## 2021-06-21 DIAGNOSIS — Z23 Encounter for immunization: Secondary | ICD-10-CM | POA: Diagnosis not present

## 2021-06-21 MED ORDER — INFLUENZA VAC A&B SA ADJ QUAD 0.5 ML IM PRSY
PREFILLED_SYRINGE | INTRAMUSCULAR | 0 refills | Status: DC
  Filled 2021-06-21: qty 0.5, 1d supply, fill #0

## 2021-06-22 ENCOUNTER — Encounter: Payer: Self-pay | Admitting: Family Medicine

## 2021-06-22 NOTE — Telephone Encounter (Signed)
printed

## 2021-07-01 ENCOUNTER — Other Ambulatory Visit: Payer: Self-pay

## 2021-07-01 ENCOUNTER — Other Ambulatory Visit (HOSPITAL_BASED_OUTPATIENT_CLINIC_OR_DEPARTMENT_OTHER): Payer: Self-pay

## 2021-07-01 DIAGNOSIS — E782 Mixed hyperlipidemia: Secondary | ICD-10-CM

## 2021-07-01 NOTE — Telephone Encounter (Signed)
Pt scheduled for lab appointment 07/06/21, waiting schedule vv

## 2021-07-06 ENCOUNTER — Other Ambulatory Visit: Payer: Medicare Other

## 2021-07-08 ENCOUNTER — Telehealth: Payer: Medicare Other | Admitting: Family Medicine

## 2021-08-15 ENCOUNTER — Other Ambulatory Visit (HOSPITAL_BASED_OUTPATIENT_CLINIC_OR_DEPARTMENT_OTHER): Payer: Self-pay

## 2021-08-16 ENCOUNTER — Other Ambulatory Visit (HOSPITAL_BASED_OUTPATIENT_CLINIC_OR_DEPARTMENT_OTHER): Payer: Self-pay

## 2021-09-02 ENCOUNTER — Encounter: Payer: Self-pay | Admitting: Family Medicine

## 2021-09-02 ENCOUNTER — Other Ambulatory Visit: Payer: Self-pay | Admitting: Family Medicine

## 2021-09-02 DIAGNOSIS — Z9889 Other specified postprocedural states: Secondary | ICD-10-CM

## 2021-09-02 DIAGNOSIS — Z780 Asymptomatic menopausal state: Secondary | ICD-10-CM

## 2021-09-02 DIAGNOSIS — E2839 Other primary ovarian failure: Secondary | ICD-10-CM

## 2021-09-26 ENCOUNTER — Other Ambulatory Visit (HOSPITAL_BASED_OUTPATIENT_CLINIC_OR_DEPARTMENT_OTHER): Payer: Self-pay

## 2021-09-26 ENCOUNTER — Other Ambulatory Visit: Payer: Self-pay | Admitting: Hematology & Oncology

## 2021-09-27 ENCOUNTER — Other Ambulatory Visit (HOSPITAL_BASED_OUTPATIENT_CLINIC_OR_DEPARTMENT_OTHER): Payer: Self-pay

## 2021-09-27 MED ORDER — FUSION PLUS PO CAPS
1.0000 | ORAL_CAPSULE | Freq: Every day | ORAL | 3 refills | Status: DC
  Filled 2021-09-27: qty 30, 30d supply, fill #0
  Filled 2021-10-26: qty 30, 30d supply, fill #1
  Filled 2021-12-12: qty 30, 30d supply, fill #2
  Filled 2022-02-03: qty 30, 30d supply, fill #3

## 2021-10-18 ENCOUNTER — Other Ambulatory Visit: Payer: Self-pay

## 2021-10-18 MED ORDER — LOSARTAN POTASSIUM 50 MG PO TABS: 50.0000 mg | ORAL_TABLET | Freq: Every day | ORAL | 1 refills | Status: DC

## 2021-10-26 ENCOUNTER — Other Ambulatory Visit (HOSPITAL_BASED_OUTPATIENT_CLINIC_OR_DEPARTMENT_OTHER): Payer: Self-pay

## 2021-10-26 ENCOUNTER — Inpatient Hospital Stay: Payer: Medicare Other | Attending: Family

## 2021-10-26 ENCOUNTER — Other Ambulatory Visit: Payer: Self-pay

## 2021-10-26 ENCOUNTER — Telehealth: Payer: Self-pay | Admitting: *Deleted

## 2021-10-26 ENCOUNTER — Encounter: Payer: Self-pay | Admitting: Hematology & Oncology

## 2021-10-26 ENCOUNTER — Inpatient Hospital Stay (HOSPITAL_BASED_OUTPATIENT_CLINIC_OR_DEPARTMENT_OTHER): Payer: Medicare Other | Admitting: Hematology & Oncology

## 2021-10-26 VITALS — BP 135/88 | HR 65 | Temp 98.1°F | Resp 18 | Wt 190.1 lb

## 2021-10-26 DIAGNOSIS — N189 Chronic kidney disease, unspecified: Secondary | ICD-10-CM | POA: Diagnosis not present

## 2021-10-26 DIAGNOSIS — Z862 Personal history of diseases of the blood and blood-forming organs and certain disorders involving the immune mechanism: Secondary | ICD-10-CM

## 2021-10-26 DIAGNOSIS — R918 Other nonspecific abnormal finding of lung field: Secondary | ICD-10-CM | POA: Insufficient documentation

## 2021-10-26 DIAGNOSIS — D0511 Intraductal carcinoma in situ of right breast: Secondary | ICD-10-CM | POA: Insufficient documentation

## 2021-10-26 LAB — CMP (CANCER CENTER ONLY)
ALT: 20 U/L (ref 0–44)
AST: 29 U/L (ref 15–41)
Albumin: 3.9 g/dL (ref 3.5–5.0)
Alkaline Phosphatase: 63 U/L (ref 38–126)
Anion gap: 6 (ref 5–15)
BUN: 33 mg/dL — ABNORMAL HIGH (ref 8–23)
CO2: 30 mmol/L (ref 22–32)
Calcium: 10 mg/dL (ref 8.9–10.3)
Chloride: 105 mmol/L (ref 98–111)
Creatinine: 2.15 mg/dL — ABNORMAL HIGH (ref 0.44–1.00)
GFR, Estimated: 24 mL/min — ABNORMAL LOW (ref >60)
Glucose, Bld: 88 mg/dL (ref 70–99)
Potassium: 4.3 mmol/L (ref 3.5–5.1)
Sodium: 141 mmol/L (ref 135–145)
Total Bilirubin: 0.7 mg/dL (ref 0.3–1.2)
Total Protein: 7.7 g/dL (ref 6.5–8.1)

## 2021-10-26 LAB — CBC WITH DIFFERENTIAL (CANCER CENTER ONLY)
Abs Immature Granulocytes: 0 10*3/uL (ref 0.00–0.07)
Basophils Absolute: 0.1 10*3/uL (ref 0.0–0.1)
Basophils Relative: 1 %
Eosinophils Absolute: 0.2 10*3/uL (ref 0.0–0.5)
Eosinophils Relative: 4 %
HCT: 36 % (ref 36.0–46.0)
Hemoglobin: 11.9 g/dL — ABNORMAL LOW (ref 12.0–15.0)
Immature Granulocytes: 0 %
Lymphocytes Relative: 42 %
Lymphs Abs: 1.9 10*3/uL (ref 0.7–4.0)
MCH: 28.6 pg (ref 26.0–34.0)
MCHC: 33.1 g/dL (ref 30.0–36.0)
MCV: 86.5 fL (ref 80.0–100.0)
Monocytes Absolute: 0.5 10*3/uL (ref 0.1–1.0)
Monocytes Relative: 12 %
Neutro Abs: 1.8 10*3/uL (ref 1.7–7.7)
Neutrophils Relative %: 41 %
Platelet Count: 181 10*3/uL (ref 150–400)
RBC: 4.16 MIL/uL (ref 3.87–5.11)
RDW: 13 % (ref 11.5–15.5)
WBC Count: 4.4 10*3/uL (ref 4.0–10.5)
nRBC: 0 % (ref 0.0–0.2)

## 2021-10-26 LAB — RETICULOCYTES
Immature Retic Fract: 5.3 % (ref 2.3–15.9)
RBC.: 4.19 MIL/uL (ref 3.87–5.11)
Retic Count, Absolute: 41.5 10*3/uL (ref 19.0–186.0)
Retic Ct Pct: 1 % (ref 0.4–3.1)

## 2021-10-26 LAB — FERRITIN: Ferritin: 95 ng/mL (ref 11–307)

## 2021-10-26 LAB — IRON AND IRON BINDING CAPACITY (CC-WL,HP ONLY)
Iron: 81 ug/dL (ref 28–170)
Saturation Ratios: 23 % (ref 10.4–31.8)
TIBC: 347 ug/dL (ref 250–450)
UIBC: 266 ug/dL (ref 148–442)

## 2021-10-26 NOTE — Telephone Encounter (Signed)
Per 10/26/21 los - gave upcoming appointments - confirmed ?

## 2021-10-26 NOTE — Progress Notes (Signed)
?Hematology and Oncology Follow Up Visit ? ?Stephanie Pierce ?[id number] ?1950-11-27 71 y.o. ?10/26/2021 ? ? 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 ?Current Therapy:   ?Femara 2.5 mg p.o. daily ?  ?Interim History: Stephanie Pierce is here today for follow-up.  We see her every 6 months.  So far, she has been doing pretty well.  She had no problems over the Holiday season..  She is seeing nephrology now.  She does have chronic renal insufficiency. ? ?She is taking iron pills.  We will see what her iron studies show. ? ?She has had no problems with hydration.  She says she drinks 72 ounces of water a day. ? ?She has had no problems with fever.  There is been no issues with COVID. ? ?She has had no change in bowel or bladder habits.  There is been no bleeding.  She has had no leg swelling.  She has had no rashes. ? ?Overall, I would say performance status is ECOG 0.     ? ?Medications:  ?Allergies as of 10/26/2021   ? ?   Reactions  ? Lisinopril Swelling  ? Swelling in the throat  ? Iodine Hives  ? Penicillins Rash  ? ?  ? ?  ?Medication List  ?  ? ?  ? Accurate as of October 26, 2021  8:39 AM. If you have any questions, ask your nurse or doctor.  ?  ?  ? ?  ? ?Azelastine HCl 137 MCG/SPRAY Soln ?Place 1-2 sprays into both nostrils 2 (two) times daily. Use in each nostril as directed ?  ?B-12 2500 MCG Tabs ?Take 1 tablet by mouth once a week. ?  ?CENTRUM SILVER ADULT 50+ PO ?Take 1 tablet by mouth every other day. ?  ?Fluad Quadrivalent 0.5 ML injection ?Generic drug: influenza vaccine adjuvanted ?Inject into the muscle. ?  ?fluticasone 50 MCG/ACT nasal spray ?Commonly known as: FLONASE ?Place 2 sprays into both nostrils daily. ?  ?Fusion Plus Caps ?Take 1 capsule by mouth daily. ?  ?KRILL OIL OMEGA-3 PO ?Take by mouth daily. ?  ?letrozole 2.5 MG tablet ?Commonly known as: [place] ?TAKE 1 TABLET DAILY ?  ?letrozole 2.5 MG tablet ?Commonly known as:  [place] ?TAKE 1 TABLET BY MOUTH ONCE DAILY ?  ?losartan 25 MG tablet ?Commonly known as: COZAAR ?Take 1 tablet by mouth daily ?  ?losartan 50 MG tablet ?Commonly known as: COZAAR ?Take 1 tablet (50 mg total) by mouth daily. ?  ?OVER THE COUNTER MEDICATION ?Take 1 tablet by mouth daily. CALCIUM 650MG  / VIT D3 ?  ?Pfizer COVID-19 Vac Bivalent injection ?Generic drug: COVID-19 mRNA bivalent vaccine [employer]) ?Inject into the muscle. ?  ?rosuvastatin 5 MG tablet ?Commonly known as: Crestor ?Take 1 tablet (5 mg total) by mouth daily. ?  ?Synthroid 100 MCG tablet ?Generic drug: levothyroxine ?TAKE 1 TABLET DAILY BEFORE BREAKFAST ?  ? ?  ? ? ?Allergies:  ?Allergies  ?Allergen Reactions  ? Lisinopril Swelling  ?  Swelling in the throat  ? Iodine Hives  ? Penicillins Rash  ? ? ?Past Medical History, Surgical history, Social history, and Family History were reviewed and updated. ? ?Review of Systems: ?Review of Systems  ?Constitutional: Negative.   ?HENT: Negative.    ?Eyes: Negative.   ?Respiratory: Negative.    ?Cardiovascular: Negative.   ?Gastrointestinal: Negative.   ?Genitourinary: Negative.   ?Musculoskeletal: Negative.   ?Skin: Negative.   ?Neurological:  Negative.   ?Endo/Heme/Allergies: Negative.   ?Psychiatric/Behavioral: Negative.    ? ? ?Physical Exam: ? weight is 190 lb 1.9 oz (86.2 kg). Her oral temperature is 98.1 ?F (36.7 ?C). Her blood pressure is 135/88 and her pulse is 65. Her respiration is 18 and oxygen saturation is 100%.  ? ?Wt Readings from Last 3 Encounters:  ?10/26/21 190 lb 1.9 oz (86.2 kg)  ?04/28/21 188 lb 1.3 oz (85.3 kg)  ?04/14/21 192 lb 6.4 oz (87.3 kg)  ? ? ?Physical Exam ?Vitals reviewed.  ?Constitutional:   ?   Comments: Her breast exam shows the right breast with the lumpectomy that is well-healed.  This is about the 10 o'clock position.  No obvious nodularity is noted in the right breast.  There is no right axillary adenopathy.  Left breast is unremarkable.  There is no left axillary  adenopathy.  ?HENT:  ?   Head: Normocephalic and atraumatic.  ?Eyes:  ?   Pupils: Pupils are equal, round, and reactive to light.  ?Cardiovascular:  ?   Rate and Rhythm: Normal rate and regular rhythm.  ?   Heart sounds: Normal heart sounds.  ?Pulmonary:  ?   Effort: Pulmonary effort is normal.  ?   Breath sounds: Normal breath sounds.  ?Abdominal:  ?   General: Bowel sounds are normal.  ?   Palpations: Abdomen is soft.  ?Musculoskeletal:     ?   General: No tenderness or deformity. Normal range of motion.  ?   Cervical back: Normal range of motion.  ?Lymphadenopathy:  ?   Cervical: No cervical adenopathy.  ?Skin: ?   General: Skin is warm and dry.  ?   Findings: No erythema or rash.  ?Neurological:  ?   Mental Status: She is alert and oriented to person, place, and time.  ?Psychiatric:     ?   Behavior: Behavior normal.     ?   Thought Content: Thought content normal.     ?   Judgment: Judgment normal.  ? ? ? ?Lab Results  ?Component Value Date  ? WBC 4.4 10/26/2021  ? HGB 11.9 (L) 10/26/2021  ? HCT 36.0 10/26/2021  ? MCV 86.5 10/26/2021  ? PLT 181 10/26/2021  ? ?Lab Results  ?Component Value Date  ? FERRITIN 78 04/28/2021  ? IRON 55 04/28/2021  ? TIBC 326 04/28/2021  ? UIBC 271 04/28/2021  ? IRONPCTSAT 17 (L) 04/28/2021  ? ?Lab Results  ?Component Value Date  ? RETICCTPCT 1.0 10/26/2021  ? RBC 4.19 10/26/2021  ? ?No results found for: KPAFRELGTCHN, LAMBDASER, KAPLAMBRATIO ?No results found for: IGGSERUM, IGA, IGMSERUM ?No results found for: TOTALPROTELP, ALBUMINELP, A1GS, A2GS, BETS, BETA2SER, GAMS, MSPIKE, SPEI ?  Chemistry   ?   ?Component Value Date/Time  ? NA 141 05/31/2021 0000  ? NA 147 (H) 06/08/2017 0748  ? NA 140 12/07/2016 0836  ? K 4.2 05/31/2021 0000  ? K 4.1 06/08/2017 0748  ? K 4.5 12/07/2016 0836  ? CL 103 05/31/2021 0000  ? CL 109 (H) 06/08/2017 0748  ? CO2 30 (A) 05/31/2021 0000  ? CO2 29 06/08/2017 0748  ? CO2 28 12/07/2016 0836  ? BUN 20 05/31/2021 0000  ? BUN 39 (H) 06/08/2017 0748  ? BUN 34.7  (H) 12/07/2016 0836  ? CREATININE 1.9 (A) 05/31/2021 0000  ? CREATININE 2.20 (H) 04/28/2021 0836  ? CREATININE 2.1 (H) 06/08/2017 0748  ? CREATININE 2.2 (H) 12/07/2016 0836  ? GLU 93 05/31/2021 0000  ?    ?  Component Value Date/Time  ? CALCIUM 10.1 05/31/2021 0000  ? CALCIUM 9.3 06/08/2017 0748  ? CALCIUM 9.3 12/07/2016 0836  ? ALKPHOS 74 04/28/2021 0836  ? ALKPHOS 69 06/08/2017 0748  ? ALKPHOS 73 12/07/2016 0836  ? AST 26 04/28/2021 0836  ? AST 23 12/07/2016 0836  ? ALT 18 04/28/2021 0836  ? ALT 27 06/08/2017 0748  ? ALT 21 12/07/2016 0836  ? BILITOT 0.8 04/28/2021 0836  ? BILITOT 0.55 12/07/2016 0836  ?  ? ?Impression and Plan: Stephanie Pierce is a 34 yo [place] American female with DCIS of the right breast diagnosed in July 2015 with lumpectomy.  She also had radiation therapy after the lumpectomy.  She is doing well on the Femara.  We will keep her on Femara for right now. ? ?I think that her most active problem will be the kidney function.  I suspect that this is not going to be a real issue for her.  I suspect she will probably have chronic renal insufficiency for quite a while. ? ?We will see what her iron studies look like.  I know she has a low erythropoietin level but we do not have to use ESA. ? ?She is due for a mammogram in April. ? ?We will plan to get her back in 6 months.   ?Volanda Napoleon, MD ?3/1/20238:39 AM ?

## 2021-10-31 ENCOUNTER — Other Ambulatory Visit (HOSPITAL_BASED_OUTPATIENT_CLINIC_OR_DEPARTMENT_OTHER): Payer: Self-pay

## 2021-11-03 ENCOUNTER — Other Ambulatory Visit: Payer: Self-pay | Admitting: Family Medicine

## 2021-12-12 ENCOUNTER — Other Ambulatory Visit (HOSPITAL_BASED_OUTPATIENT_CLINIC_OR_DEPARTMENT_OTHER): Payer: Self-pay

## 2021-12-21 DIAGNOSIS — T39395A Adverse effect of other nonsteroidal anti-inflammatory drugs [NSAID], initial encounter: Secondary | ICD-10-CM | POA: Diagnosis not present

## 2021-12-21 DIAGNOSIS — N184 Chronic kidney disease, stage 4 (severe): Secondary | ICD-10-CM | POA: Diagnosis not present

## 2021-12-21 DIAGNOSIS — N1419 Nephropathy induced by other drugs, medicaments and biological substances: Secondary | ICD-10-CM | POA: Diagnosis not present

## 2021-12-21 DIAGNOSIS — R3129 Other microscopic hematuria: Secondary | ICD-10-CM | POA: Diagnosis not present

## 2021-12-21 DIAGNOSIS — R801 Persistent proteinuria, unspecified: Secondary | ICD-10-CM | POA: Diagnosis not present

## 2021-12-28 ENCOUNTER — Telehealth: Payer: Self-pay | Admitting: *Deleted

## 2021-12-28 NOTE — Chronic Care Management (AMB) (Signed)
?  Care Management  ? ?Note ? ?12/28/2021 ?Name: Stephanie Pierce MRN: [id number] DOB: [birth year] ? ?Stephanie Pierce is a 71 y.o. year old female who is a primary care patient of Mosie Lukes, MD. I reached out to [employer] by phone today offer care coordination services.  ? ?[name] was given information about care management services today including:  ?Care management services include personalized support from designated clinical staff supervised by her physician, including individualized plan of care and coordination with other care providers ?24/7 contact phone numbers for assistance for urgent and routine care needs. ?The patient may stop care management services at any time by phone call to the office staff. ? ?Patient agreed to services and verbal consent obtained.  ? ?Follow up plan: ?Telephone appointment with care management team member scheduled for: 01/10/2022 ? ?Lamont Glasscock, CCMA ?Care Guide, Embedded Care Coordination ?[place]  Care Management  ?Direct Dial: [phone] ? ? ?

## 2022-01-10 ENCOUNTER — Ambulatory Visit: Payer: Medicare Other

## 2022-01-10 NOTE — Patient Instructions (Signed)
Visit Information  Thank you for allowing me to share the care management and care coordination services that are available to you as part of your health plan and services through your primary care provider and medical home. Please reach out to me at [phone] if the care management/care coordination team may be of assistance to you in the future.   Arieana Somoza, RN, MSN, BSN, CCM Care Management Coordinator [facility] [phone]  

## 2022-01-10 NOTE — Chronic Care Management (AMB) (Signed)
?  Care Management  ? ?Outreach Note ? ?01/10/2022 ?Name: [name] MRN: [id number] DOB: 01-03-1951 ? ?Referred by: Mosie Lukes, MD ?Reason for referral : No chief complaint on file. ? ? ?Successful contact was made with the patient to discuss care management and care coordination services. Patient declines engagement at this time. 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 ?Follow Up Plan:  ?No further follow up required: Patient encouraged to follow up with provider if needs change. ? ? ?Thea Silversmith, RN, MSN, BSN, CCM ?Care Management Coordinator ?[place] [place] ?[phone]  ?

## 2022-01-13 DIAGNOSIS — H2513 Age-related nuclear cataract, bilateral: Secondary | ICD-10-CM | POA: Diagnosis not present

## 2022-01-13 DIAGNOSIS — H5213 Myopia, bilateral: Secondary | ICD-10-CM | POA: Diagnosis not present

## 2022-01-13 DIAGNOSIS — H353112 Nonexudative age-related macular degeneration, right eye, intermediate dry stage: Secondary | ICD-10-CM | POA: Diagnosis not present

## 2022-01-13 DIAGNOSIS — H524 Presbyopia: Secondary | ICD-10-CM | POA: Diagnosis not present

## 2022-01-13 DIAGNOSIS — H40033 Anatomical narrow angle, bilateral: Secondary | ICD-10-CM | POA: Diagnosis not present

## 2022-01-19 ENCOUNTER — Ambulatory Visit
Admission: RE | Admit: 2022-01-19 | Discharge: 2022-01-19 | Disposition: A | Discharge status: Home / Self Care | Payer: Medicare Other | Source: Ambulatory Visit | Attending: Family Medicine | Admitting: Family Medicine

## 2022-01-19 ENCOUNTER — Encounter: Payer: Self-pay | Admitting: Family Medicine

## 2022-01-19 DIAGNOSIS — Z780 Asymptomatic menopausal state: Secondary | ICD-10-CM

## 2022-01-19 DIAGNOSIS — Z9889 Other specified postprocedural states: Secondary | ICD-10-CM

## 2022-01-19 DIAGNOSIS — E2839 Other primary ovarian failure: Secondary | ICD-10-CM

## 2022-01-19 DIAGNOSIS — R928 Other abnormal and inconclusive findings on diagnostic imaging of breast: Secondary | ICD-10-CM | POA: Diagnosis not present

## 2022-01-19 DIAGNOSIS — Z853 Personal history of malignant neoplasm of breast: Secondary | ICD-10-CM | POA: Diagnosis not present

## 2022-02-03 ENCOUNTER — Other Ambulatory Visit (HOSPITAL_BASED_OUTPATIENT_CLINIC_OR_DEPARTMENT_OTHER): Payer: Self-pay

## 2022-03-21 DIAGNOSIS — H1045 Other chronic allergic conjunctivitis: Secondary | ICD-10-CM | POA: Diagnosis not present

## 2022-03-21 DIAGNOSIS — H04123 Dry eye syndrome of bilateral lacrimal glands: Secondary | ICD-10-CM | POA: Diagnosis not present

## 2022-03-21 DIAGNOSIS — H40033 Anatomical narrow angle, bilateral: Secondary | ICD-10-CM | POA: Diagnosis not present

## 2022-03-27 ENCOUNTER — Ambulatory Visit (INDEPENDENT_AMBULATORY_CARE_PROVIDER_SITE_OTHER): Payer: Medicare Other

## 2022-03-27 VITALS — Ht 67.0 in | Wt 190.0 lb

## 2022-03-27 DIAGNOSIS — Z0000 Encounter for general adult medical examination without abnormal findings: Secondary | ICD-10-CM

## 2022-03-27 NOTE — Patient Instructions (Signed)
[name] , Thank you for taking time to complete your Medicare Wellness Visit. I appreciate your ongoing commitment to your health goals. Please review the following plan we discussed and let me know if I can assist you in the future.   Screening recommendations/referrals: Colonoscopy: Completed Cologuard 02/20/2020-Due 02/20/2023 Mammogram: Completed 01/19/2022-Due 01/20/2023 Bone Density: Completed 01/19/2022-Due 01/20/2024 Recommended yearly ophthalmology/optometry visit for glaucoma screening and checkup Recommended yearly dental visit for hygiene and checkup  Vaccinations: Influenza vaccine: Up to date Pneumococcal vaccine: Up to date Tdap vaccine: Up to date Shingles vaccine: Due-May obtain vaccine at your local pharmacy. Covid-19:Up to date  Advanced directives: May pick up information at your next office visit  Conditions/risks identified: See problem list  Next appointment: Follow up in one year for your annual wellness visit    Preventive Care 65 Years and Older, Female Preventive care refers to lifestyle choices and visits with your health care provider that can promote health and wellness. What does preventive care include? A yearly physical exam. This is also called an annual well check. Dental exams once or twice a year. Routine eye exams. Ask your health care provider how often you should have your eyes checked. Personal lifestyle choices, including: Daily care of your teeth and gums. Regular physical activity. Eating a healthy diet. Avoiding tobacco and drug use. Limiting alcohol use. Practicing safe sex. Taking low-dose aspirin every day. Taking vitamin and mineral supplements as recommended by your health care provider. What happens during an annual well check? The services and screenings done by your health care provider during your annual well check will depend on your age, overall health, lifestyle risk factors, and family history of disease. Counseling   Your health care provider may ask you questions about your: Alcohol use. Tobacco use. Drug use. Emotional well-being. Home and relationship well-being. Sexual activity. Eating habits. History of falls. Memory and ability to understand (cognition). Work and work Statistician. Reproductive health. Screening  You may have the following tests or measurements: Height, weight, and BMI. Blood pressure. Lipid and cholesterol levels. These may be checked every 5 years, or more frequently if you are over 85 years old. Skin check. Lung cancer screening. You may have this screening every year starting at age 45 if you have a 30-pack-year history of smoking and currently smoke or have quit within the past 15 years. Fecal occult blood test (FOBT) of the stool. You may have this test every year starting at age 27. Flexible sigmoidoscopy or colonoscopy. You may have a sigmoidoscopy every 5 years or a colonoscopy every 10 years starting at age 52. Hepatitis C blood test. Hepatitis B blood test. Sexually transmitted disease (STD) testing. Diabetes screening. This is done by checking your blood sugar (glucose) after you have not eaten for a while (fasting). You may have this done every 1-3 years. Bone density scan. This is done to screen for osteoporosis. You may have this done starting at age 82. Mammogram. This may be done every 1-2 years. Talk to your health care provider about how often you should have regular mammograms. Talk with your health care provider about your test results, treatment options, and if necessary, the need for more tests. Vaccines  Your health care provider may recommend certain vaccines, such as: Influenza vaccine. This is recommended every year. Tetanus, diphtheria, and acellular pertussis (Tdap, Td) vaccine. You may need a Td booster every 10 years. Zoster vaccine. You may need this after age 16. Pneumococcal 13-valent conjugate (PCV13) vaccine. One dose  is recommended  after age 49. Pneumococcal polysaccharide (PPSV23) vaccine. One dose is recommended after age 44. Talk to your health care provider about which screenings and vaccines you need and how often you need them. This information is not intended to replace advice given to you by your health care provider. Make sure you discuss any questions you have with your health care provider. Document Released: 09/10/2015 Document Revised: 05/03/2016 Document Reviewed: 06/15/2015 Elsevier Interactive Patient Education  2017 [place] Prevention in the Home Falls can cause injuries. They can happen to people of all ages. There are many things you can do to make your home safe and to help prevent falls. What can I do on the outside of my home? Regularly fix the edges of walkways and driveways and fix any cracks. Remove anything that might make you trip as you walk through a door, such as a raised step or threshold. Trim any bushes or trees on the path to your home. Use bright outdoor lighting. Clear any walking paths of anything that might make someone trip, such as rocks or tools. Regularly check to see if handrails are loose or broken. Make sure that both sides of any steps have handrails. Any raised decks and porches should have guardrails on the edges. Have any leaves, snow, or ice cleared regularly. Use sand or salt on walking paths during winter. Clean up any spills in your garage right away. This includes oil or grease spills. What can I do in the bathroom? Use night lights. Install grab bars by the toilet and in the tub and shower. Do not use towel bars as grab bars. Use non-skid mats or decals in the tub or shower. If you need to sit down in the shower, use a plastic, non-slip stool. Keep the floor dry. Clean up any water that spills on the floor as soon as it happens. Remove soap buildup in the tub or shower regularly. Attach bath mats securely with double-sided non-slip rug tape. Do not  have throw rugs and other things on the floor that can make you trip. What can I do in the bedroom? Use night lights. Make sure that you have a light by your bed that is easy to reach. Do not use any sheets or blankets that are too big for your bed. They should not hang down onto the floor. Have a firm chair that has side arms. You can use this for support while you get dressed. Do not have throw rugs and other things on the floor that can make you trip. What can I do in the kitchen? Clean up any spills right away. Avoid walking on wet floors. Keep items that you use a lot in easy-to-reach places. If you need to reach something above you, use a strong step stool that has a grab bar. Keep electrical cords out of the way. Do not use floor polish or wax that makes floors slippery. If you must use wax, use non-skid floor wax. Do not have throw rugs and other things on the floor that can make you trip. What can I do with my stairs? Do not leave any items on the stairs. Make sure that there are handrails on both sides of the stairs and use them. Fix handrails that are broken or loose. Make sure that handrails are as long as the stairways. Check any carpeting to make sure that it is firmly attached to the stairs. Fix any carpet that is loose or worn. Avoid  having throw rugs at the top or bottom of the stairs. If you do have throw rugs, attach them to the floor with carpet tape. Make sure that you have a light switch at the top of the stairs and the bottom of the stairs. If you do not have them, ask someone to add them for you. What else can I do to help prevent falls? Wear shoes that: Do not have high heels. Have rubber bottoms. Are comfortable and fit you well. Are closed at the toe. Do not wear sandals. If you use a stepladder: Make sure that it is fully opened. Do not climb a closed stepladder. Make sure that both sides of the stepladder are locked into place. Ask someone to hold it for  you, if possible. Clearly mark and make sure that you can see: Any grab bars or handrails. First and last steps. Where the edge of each step is. Use tools that help you move around (mobility aids) if they are needed. These include: Canes. Walkers. Scooters. Crutches. Turn on the lights when you go into a dark area. Replace any light bulbs as soon as they burn out. Set up your furniture so you have a clear path. Avoid moving your furniture around. If any of your floors are uneven, fix them. If there are any pets around you, be aware of where they are. Review your medicines with your doctor. Some medicines can make you feel dizzy. This can increase your chance of falling. Ask your doctor what other things that you can do to help prevent falls. This information is not intended to replace advice given to you by your health care provider. Make sure you discuss any questions you have with your health care provider. Document Released: 06/10/2009 Document Revised: 01/20/2016 Document Reviewed: 09/18/2014 Elsevier Interactive Patient Education  2017 [employer].

## 2022-03-27 NOTE — Progress Notes (Signed)
Subjective:   Stephanie Pierce is a 71 y.o. female who presents for Medicare Annual (Subsequent) preventive examination.  I connected with Jacara today by telephone and verified that I am speaking with the correct person using two identifiers. Location patient: home Location provider: work Persons participating in the virtual visit: patient, Marine scientist.    I discussed the limitations, risks, security and privacy concerns of performing an evaluation and management service by telephone and the availability of in person appointments. I also discussed with the patient that there may be a patient responsible charge related to this service. The patient expressed understanding and verbally consented to this telephonic visit.    Interactive audio and video telecommunications were attempted between this provider and patient, however failed, due to patient having technical difficulties OR patient did not have access to video capability.  We continued and completed visit with audio only.  Some vital signs may be absent or patient reported.   Time Spent with patient on telephone encounter: 20 minutes   Review of Systems     Cardiac Risk Factors include: advanced age (>67men, >76 women);dyslipidemia     Objective:    Today's Vitals   03/27/22 0830  Weight: 190 lb (86.2 kg)  Height: 5\' 7"  (1.702 m)   Body mass index is 29.76 kg/m.     03/27/2022    8:33 AM 10/26/2021    8:27 AM 04/28/2021    8:55 AM 03/18/2021    9:05 AM 10/27/2020    8:43 AM 06/29/2020    9:00 AM 11/21/2019    8:44 AM  Advanced Directives  Does Patient Have a Medical Advance Directive? No No No No No No No  Does patient want to make changes to medical advance directive?       No - Patient declined  Would patient like information on creating a medical advance directive?  No - Patient declined No - Patient declined No - Patient declined No - Patient declined No - Patient declined     Current Medications  (verified) Outpatient Encounter Medications as of 03/27/2022  Medication Sig   Cyanocobalamin (B-12) 2500 MCG TABS Take 1 tablet by mouth once a week.   fluticasone (FLONASE) 50 MCG/ACT nasal spray Place 2 sprays into both nostrils daily.   Iron-FA-B Cmp-C-Biot-Probiotic (FUSION PLUS) CAPS Take 1 capsule by mouth daily.   KRILL OIL OMEGA-3 PO Take by mouth daily.    letrozole (FEMARA) 2.5 MG tablet TAKE 1 TABLET BY MOUTH ONCE DAILY   losartan (COZAAR) 50 MG tablet Take 1 tablet (50 mg total) by mouth daily.   Multiple Vitamins-Minerals (CENTRUM SILVER ADULT 50+ PO) Take 1 tablet by mouth every other day.   OVER THE COUNTER MEDICATION Take 1 tablet by mouth daily. CALCIUM 650MG  / VIT D3   SYNTHROID 100 MCG tablet TAKE 1 TABLET DAILY BEFORE BREAKFAST   azelastine (ASTELIN) 0.1 % nasal spray Place 1-2 sprays into both nostrils 2 (two) times daily. Use in each nostril as directed (Patient not taking: Reported on 04/28/2021)   COVID-19 mRNA bivalent vaccine, Pfizer, injection Inject into the muscle. (Patient not taking: Reported on 03/27/2022)   influenza vaccine adjuvanted (FLUAD) 0.5 ML injection Inject into the muscle. (Patient not taking: Reported on 10/26/2021)   letrozole (FEMARA) 2.5 MG tablet TAKE 1 TABLET DAILY (Patient not taking: Reported on 10/26/2021)   losartan (COZAAR) 25 MG tablet Take 1 tablet by mouth daily (Patient not taking: Reported on 10/26/2021)   rosuvastatin (CRESTOR) 5 MG tablet  Take 1 tablet (5 mg total) by mouth daily. (Patient not taking: Reported on 04/28/2021)   No facility-administered encounter medications on file as of 03/27/2022.    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   Enlarged thyroid gland    H/O iron  deficiency anemia 03/23/2015   History of chicken pox 03/23/2015   Hyperlipidemia, mixed 03/23/2015   Hypothyroidism 03/23/2015   Overweight 03/23/2015   Personal history of radiation therapy    Pneumonia 2012   Renal insufficiency 03/23/2015   S/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   Vitamin D deficiency 05/12/2015   Past Surgical History:  Procedure Laterality Date   BREAST LUMPECTOMY Right    h/o 2 needle biopsy, in 2015 right Lumpectomy DCIS stage 0   BREAST LUMPECTOMY WITH RADIOACTIVE SEED LOCALIZATION Right 02/07/2019   Procedure: RIGHT BREAST LUMPECTOMY WITH RADIOACTIVE SEED LOCALIZATION;  Surgeon: Alphonsa Overall, MD;  Location: [facility];  Service: General;  Laterality: Right;   [place] RESECTION     Family History  Problem Relation Age of Onset   Hypertension Father    Heart attack Father    Alcohol abuse Father    Cancer Sister        metastatic breast cancer   Diabetes Sister    Hypertension Sister    Breast cancer Sister    Diabetes Brother    Other Brother        complications of exposure to agent orange   Stroke Brother    Diverticulosis Daughter    Social History   Socioeconomic History   Marital status: Married    Spouse name: Not on file   Number of children: Not on file   Years of education: 18   Highest education level: Not on file  Occupational History   Occupation: RN Tourist information centre manager  Tobacco Use   Smoking status: Never   Smokeless tobacco: Never  Vaping Use   Vaping Use: Never used  Substance and Sexual Activity   Alcohol use: No    Alcohol/week: 0.0 standard drinks of alcohol   Drug use: No   Sexual activity: Yes    Comment: 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, RN case Manager, no dietary restrictions follows heart healthy diet with 60 to 80 gm of protein  Other Topics Concern   Not on file  Social History Narrative   Not on file   Social Determinants of Health   Financial Resource  Strain: Low Risk  (03/27/2022)   Overall Financial Resource Strain (CARDIA)    Difficulty of Paying Living Expenses: Not hard at all  Food Insecurity: No Food Insecurity (03/27/2022)   Hunger Vital Sign    Worried About Running Out of Food in the Last Year: Never true    Ran Out of Food in the Last Year: Never true  Transportation Needs: No Transportation Needs (03/27/2022)   PRAPARE - Hydrologist (Medical): No    Lack of Transportation (Non-Medical): No  Physical Activity: Sufficiently Active (03/27/2022)   Exercise Vital Sign    Days of Exercise per Week: 7 days    Minutes of Exercise per Session:  30 min  Stress: No Stress Concern Present (03/18/2021)   [place]    Feeling of Stress : Not at all  Social Connections: [place] (03/27/2022)   Social Connection and Isolation Panel [NHANES]    Frequency of Communication with Friends and Family: More than three times a week    Frequency of Social Gatherings with Friends and Family: More than three times a week    Attends Religious Services: More than 4 times per year    Active Member of [employer] or Organizations: Yes    Attends Music therapist: More than 4 times per year    Marital Status: Married    Tobacco Counseling Counseling given: Not Answered   Clinical Intake:  Pre-visit preparation completed: Yes  Pain : No/denies pain     BMI - recorded: 29.76 Nutritional Status: BMI 25 -29 Overweight Nutritional Risks: None Diabetes: No  How often do you need to have someone help you when you read instructions, pamphlets, or other written materials from your doctor or pharmacy?: 1 - Never  Diabetic?No  Interpreter Needed?: No  Information entered by :: Caroleen Hamman LPN   Activities of Daily Living    03/27/2022    8:36 AM 03/26/2022    5:46 PM  In your present state of health, do you have any difficulty  performing the following activities:  Hearing? 0 0  Vision? 0 0  Difficulty concentrating or making decisions? 0 0  Walking or climbing stairs? 0 0  Dressing or bathing? 0 0  Doing errands, shopping? 0   Preparing Food and eating ? N N  Using the Toilet? N N  In the past six months, have you accidently leaked urine? N N  Do you have problems with loss of bowel control? N N  Managing your Medications? N N  Managing your Finances? N N  Housekeeping or managing your Housekeeping? N N    Patient Care Team: Mosie Lukes, MD as PCP - General (Family Medicine) Marin Olp Rudell Cobb, MD as Consulting Physician (Oncology)  Indicate any recent Medical Services you may have received from other than [facility] providers in the past year (date may be approximate).     Assessment:   This is a routine wellness examination for [place].  Hearing/Vision screen Hearing Screening - Comments:: No issues Vision Screening - Comments:: Last eye exam-03/21/2022-Dr. Bing Plume  Dietary issues and exercise activities discussed: Current Exercise Habits: Home exercise routine, Type of exercise: walking, Time (Minutes): 30, Frequency (Times/Week): 7, Weekly Exercise (Minutes/Week): 210, Intensity: Mild   Goals Addressed             This Visit's Progress    Maintain healthy active lifestyle.   On track      Depression Screen    03/27/2022    8:36 AM 04/14/2021   11:10 AM 03/18/2021    9:07 AM 11/21/2019    8:49 AM 10/05/2017    1:03 PM 04/08/2015    9:50 AM 04/05/2015    6:48 PM  PHQ 2/9 Scores  PHQ - 2 Score 0 0 0 0 0 0 0    Fall Risk    03/27/2022    8:34 AM 03/26/2022    5:46 PM 04/14/2021   11:11 AM 03/18/2021    9:06 AM 11/21/2019    8:49 AM  Fall Risk   Falls in the past year? 0 0 0 0 0  Number falls in past yr: 0  0 0 0  Injury with Fall? 0  0 0 0  Risk for fall due to :   No Fall Risks    Follow up Falls prevention discussed  Falls evaluation completed Falls prevention discussed Education  provided;Falls prevention discussed    FALL RISK PREVENTION PERTAINING TO THE HOME:  Any stairs in or around the home? Yes  If so, are there any without handrails? No  Home free of loose throw rugs in walkways, pet beds, electrical cords, etc? Yes  Adequate lighting in your home to reduce risk of falls? Yes   ASSISTIVE DEVICES UTILIZED TO PREVENT FALLS:  Life alert? No  Use of a cane, walker or w/c? No  Grab bars in the bathroom? No  Shower chair or bench in shower? No  Elevated toilet seat or a handicapped toilet? No   TIMED UP AND GO:  Was the test performed? No . Phone visit  Cognitive Function:Normal cognitive status assessed by this Nurse Health Advisor. No abnormalities found.          Immunizations Immunization History  Administered Date(s) Administered   Fluad Quad(high Dose 65+) 06/21/2021   Influenza,inj,Quad PF,6+ Mos 06/08/2016   Influenza-Unspecified 05/28/2014, 05/29/2015, 06/08/2016, 06/15/2017, 05/04/2018, 05/10/2019   PFIZER(Purple Top)SARS-COV-2 Vaccination 10/04/2019, 10/25/2019, 05/08/2020, 11/27/2020   PPD Test 01/28/2020   Pfizer Covid-19 Vaccine Bivalent Booster 45yrs & up 05/31/2021   Pneumococcal Conjugate-13 02/03/2016   Pneumococcal Polysaccharide-23 10/05/2017   Pneumococcal-Unspecified 08/28/2010   Tdap 02/03/2016   Zoster, Live 03/23/2015    TDAP status: Up to date  Flu Vaccine status: Up to date  Pneumococcal vaccine status: Up to date  Covid-19 vaccine status: Completed vaccines  Qualifies for Shingles Vaccine? Yes   Zostavax completed Yes   Shingrix Completed?: No.    Education has been provided regarding the importance of this vaccine. Patient has been advised to call insurance company to determine out of pocket expense if they have not yet received this vaccine. Advised may also receive vaccine at local pharmacy or Health Dept. Verbalized acceptance and understanding.  Screening Tests Health Maintenance  Topic Date Due    Zoster Vaccines- Shingrix (1 of 2) Never done   INFLUENZA VACCINE  03/28/2022   Fecal DNA (Cologuard)  02/20/2023   MAMMOGRAM  01/20/2024   TETANUS/TDAP  02/02/2026   Pneumonia Vaccine 60+ Years old  Completed   DEXA SCAN  Completed   COVID-19 Vaccine  Completed   Hepatitis C Screening  Completed   HPV VACCINES  Aged Out   COLONOSCOPY (Pts 45-36yrs Insurance coverage will need to be confirmed)  Discontinued    Health Maintenance  Health Maintenance Due  Topic Date Due   Zoster Vaccines- Shingrix (1 of 2) Never done    Colorectal cancer screening: Type of screening: Cologuard. Completed 02/20/2020. Repeat every 3 years  Mammogram status: Completed bilateral 01/19/2022. Repeat every year  Bone Density status: Completed 01/19/2022. Results reflect: Bone density results: NORMAL. Repeat every 2 years.  Lung Cancer Screening: (Low Dose CT Chest recommended if Age 10-80 years, 30 pack-year currently smoking OR have quit w/in 15years.) does not qualify.     Additional Screening:  Hepatitis C Screening: Completed 10/05/2017  Vision Screening: Recommended annual ophthalmology exams for early detection of glaucoma and other disorders of the eye. Is the patient up to date with their annual eye exam?  Yes  Who is the provider or what is the name of the office in which the patient attends annual eye exams? [facility]   Dental Screening: Recommended annual dental exams for proper oral hygiene  Community Resource Referral / Chronic Care Management: CRR required this visit?  No   CCM required this visit?  No      Plan:     I have personally reviewed and noted the following in the patient's chart:   Medical and social history Use of alcohol, tobacco or illicit drugs  Current medications and supplements including opioid prescriptions.  Functional ability and status Nutritional status Physical activity Advanced directives List of other physicians Hospitalizations,  surgeries, and ER visits in previous 12 months Vitals Screenings to include cognitive, depression, and falls Referrals and appointments  In addition, I have reviewed and discussed with patient certain preventive protocols, quality metrics, and best practice recommendations. A written personalized care plan for preventive services as well as general preventive health recommendations were provided to patient.   Due to this being a telephonic visit, the after visit summary with patients personalized plan was offered to patient via mail or my-chart. Patient would like to access on my-chart.   Marta Antu, LPN   1/61/0960  Nurse Health Advisor  Nurse Notes: None

## 2022-03-29 ENCOUNTER — Other Ambulatory Visit: Payer: Self-pay | Admitting: Hematology & Oncology

## 2022-03-29 ENCOUNTER — Other Ambulatory Visit (HOSPITAL_BASED_OUTPATIENT_CLINIC_OR_DEPARTMENT_OTHER): Payer: Self-pay

## 2022-03-29 MED ORDER — FUSION PLUS PO CAPS
1.0000 | ORAL_CAPSULE | Freq: Every day | ORAL | 3 refills | Status: DC
  Filled 2022-03-29: qty 30, 30d supply, fill #0
  Filled 2022-05-09: qty 30, 30d supply, fill #1

## 2022-04-17 ENCOUNTER — Other Ambulatory Visit: Payer: Self-pay | Admitting: Hematology & Oncology

## 2022-04-17 ENCOUNTER — Other Ambulatory Visit: Payer: Self-pay | Admitting: Family Medicine

## 2022-04-17 DIAGNOSIS — D0510 Intraductal carcinoma in situ of unspecified breast: Secondary | ICD-10-CM

## 2022-04-18 ENCOUNTER — Other Ambulatory Visit (HOSPITAL_BASED_OUTPATIENT_CLINIC_OR_DEPARTMENT_OTHER): Payer: Self-pay

## 2022-04-18 DIAGNOSIS — N184 Chronic kidney disease, stage 4 (severe): Secondary | ICD-10-CM | POA: Diagnosis not present

## 2022-04-18 DIAGNOSIS — T39395A Adverse effect of other nonsteroidal anti-inflammatory drugs [NSAID], initial encounter: Secondary | ICD-10-CM | POA: Diagnosis not present

## 2022-04-18 DIAGNOSIS — N1419 Nephropathy induced by other drugs, medicaments and biological substances: Secondary | ICD-10-CM | POA: Diagnosis not present

## 2022-04-25 ENCOUNTER — Encounter: Payer: Self-pay | Admitting: Family Medicine

## 2022-04-25 DIAGNOSIS — N184 Chronic kidney disease, stage 4 (severe): Secondary | ICD-10-CM | POA: Diagnosis not present

## 2022-04-25 DIAGNOSIS — T39395A Adverse effect of other nonsteroidal anti-inflammatory drugs [NSAID], initial encounter: Secondary | ICD-10-CM | POA: Diagnosis not present

## 2022-04-25 DIAGNOSIS — N1419 Nephropathy induced by other drugs, medicaments and biological substances: Secondary | ICD-10-CM | POA: Diagnosis not present

## 2022-04-25 DIAGNOSIS — R801 Persistent proteinuria, unspecified: Secondary | ICD-10-CM | POA: Diagnosis not present

## 2022-04-25 DIAGNOSIS — I129 Hypertensive chronic kidney disease with stage 1 through stage 4 chronic kidney disease, or unspecified chronic kidney disease: Secondary | ICD-10-CM | POA: Diagnosis not present

## 2022-04-25 DIAGNOSIS — N1832 Chronic kidney disease, stage 3b: Secondary | ICD-10-CM | POA: Diagnosis not present

## 2022-04-25 DIAGNOSIS — M898X9 Other specified disorders of bone, unspecified site: Secondary | ICD-10-CM | POA: Diagnosis not present

## 2022-04-25 DIAGNOSIS — E559 Vitamin D deficiency, unspecified: Secondary | ICD-10-CM | POA: Diagnosis not present

## 2022-04-25 DIAGNOSIS — D631 Anemia in chronic kidney disease: Secondary | ICD-10-CM | POA: Diagnosis not present

## 2022-04-28 ENCOUNTER — Inpatient Hospital Stay (HOSPITAL_BASED_OUTPATIENT_CLINIC_OR_DEPARTMENT_OTHER): Payer: Medicare Other | Admitting: Hematology & Oncology

## 2022-04-28 ENCOUNTER — Inpatient Hospital Stay: Payer: Medicare Other | Attending: Hematology & Oncology

## 2022-04-28 ENCOUNTER — Encounter: Payer: Self-pay | Admitting: Hematology & Oncology

## 2022-04-28 VITALS — BP 157/88 | HR 73 | Temp 98.3°F | Resp 20 | Ht 67.5 in | Wt 190.4 lb

## 2022-04-28 DIAGNOSIS — N289 Disorder of kidney and ureter, unspecified: Secondary | ICD-10-CM | POA: Diagnosis not present

## 2022-04-28 DIAGNOSIS — Z79811 Long term (current) use of aromatase inhibitors: Secondary | ICD-10-CM | POA: Insufficient documentation

## 2022-04-28 DIAGNOSIS — R918 Other nonspecific abnormal finding of lung field: Secondary | ICD-10-CM | POA: Insufficient documentation

## 2022-04-28 DIAGNOSIS — D0511 Intraductal carcinoma in situ of right breast: Secondary | ICD-10-CM

## 2022-04-28 LAB — CBC WITH DIFFERENTIAL (CANCER CENTER ONLY)
Abs Immature Granulocytes: 0.01 10*3/uL (ref 0.00–0.07)
Basophils Absolute: 0 10*3/uL (ref 0.0–0.1)
Basophils Relative: 1 %
Eosinophils Absolute: 0.1 10*3/uL (ref 0.0–0.5)
Eosinophils Relative: 3 %
HCT: 35.2 % — ABNORMAL LOW (ref 36.0–46.0)
Hemoglobin: 11.5 g/dL — ABNORMAL LOW (ref 12.0–15.0)
Immature Granulocytes: 0 %
Lymphocytes Relative: 33 %
Lymphs Abs: 1.5 10*3/uL (ref 0.7–4.0)
MCH: 28.6 pg (ref 26.0–34.0)
MCHC: 32.7 g/dL (ref 30.0–36.0)
MCV: 87.6 fL (ref 80.0–100.0)
Monocytes Absolute: 0.6 10*3/uL (ref 0.1–1.0)
Monocytes Relative: 13 %
Neutro Abs: 2.2 10*3/uL (ref 1.7–7.7)
Neutrophils Relative %: 50 %
Platelet Count: 156 10*3/uL (ref 150–400)
RBC: 4.02 MIL/uL (ref 3.87–5.11)
RDW: 12.9 % (ref 11.5–15.5)
WBC Count: 4.5 10*3/uL (ref 4.0–10.5)
nRBC: 0 % (ref 0.0–0.2)

## 2022-04-28 LAB — CMP (CANCER CENTER ONLY)
ALT: 20 U/L (ref 0–44)
AST: 28 U/L (ref 15–41)
Albumin: 4 g/dL (ref 3.5–5.0)
Alkaline Phosphatase: 63 U/L (ref 38–126)
Anion gap: 6 (ref 5–15)
BUN: 36 mg/dL — ABNORMAL HIGH (ref 8–23)
CO2: 30 mmol/L (ref 22–32)
Calcium: 10.3 mg/dL (ref 8.9–10.3)
Chloride: 105 mmol/L (ref 98–111)
Creatinine: 2.34 mg/dL — ABNORMAL HIGH (ref 0.44–1.00)
GFR, Estimated: 22 mL/min — ABNORMAL LOW (ref >60)
Glucose, Bld: 91 mg/dL (ref 70–99)
Potassium: 4 mmol/L (ref 3.5–5.1)
Sodium: 141 mmol/L (ref 135–145)
Total Bilirubin: 0.7 mg/dL (ref 0.3–1.2)
Total Protein: 7.9 g/dL (ref 6.5–8.1)

## 2022-04-28 LAB — FERRITIN: Ferritin: 84 ng/mL (ref 11–307)

## 2022-04-28 LAB — RETICULOCYTES
Immature Retic Fract: 6 % (ref 2.3–15.9)
RBC.: 4.03 MIL/uL (ref 3.87–5.11)
Retic Count, Absolute: 39.5 10*3/uL (ref 19.0–186.0)
Retic Ct Pct: 1 % (ref 0.4–3.1)

## 2022-04-28 LAB — IRON AND IRON BINDING CAPACITY (CC-WL,HP ONLY)
Iron: 59 ug/dL (ref 28–170)
Saturation Ratios: 19 % (ref 10.4–31.8)
TIBC: 307 ug/dL (ref 250–450)
UIBC: 248 ug/dL (ref 148–442)

## 2022-04-28 NOTE — Progress Notes (Signed)
Hematology and Oncology Follow Up Visit  Stephanie Pierce [id number] [birth year] 71 y.o. 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 daily   Interim History: Stephanie Pierce is here today for follow-up.  She is now 71 years old.  She had a birthday in July.  She had a nice birthday.  She worked on her birthday.  Otherwise, she is doing okay.  She says that the nephrologist is managing her kidneys.  Everything seems to be holding stable with that.  She is on oral iron.  When we last saw her, her ferritin was 95 with an iron saturation of 23%.  She has had no problems with the Femara.  There is been no arthralgias or myalgias.  She had a mammogram back in May.  Everything looks fine.  She has had no cough or shortness of breath.  She has had no rashes.  There has been no nausea or vomiting.  She has had no leg swelling.  She has had no headache.  Overall, I would say performance status is probably ECOG 1.     Medications:  Allergies as of 04/28/2022       Reactions   Lisinopril Swelling   Swelling in the throat   Iodine Hives   Penicillins Rash        Medication List        Accurate as of April 28, 2022  8:27 AM. If you have any questions, ask your nurse or doctor.          STOP taking these medications    Pfizer COVID-19 Vac Bivalent injection Generic drug: COVID-19 mRNA bivalent vaccine Therapist, music) Stopped by: Volanda Napoleon, MD       TAKE these medications    Azelastine HCl 137 MCG/SPRAY Soln Place 1-2 sprays into both nostrils 2 (two) times daily. Use in each nostril as directed   B-12 2500 MCG Tabs Take 1 tablet by mouth once a week.   CENTRUM SILVER ADULT 50+ PO Take 1 tablet by mouth every other day.   Fluad Quadrivalent 0.5 ML injection Generic drug: influenza vaccine adjuvanted Inject into the muscle.   fluticasone 50 MCG/ACT  nasal spray Commonly known as: FLONASE Place 2 sprays into both nostrils daily.   Fusion Plus Caps Take 1 capsule by mouth daily.   KRILL OIL OMEGA-3 PO Take by mouth daily.   letrozole 2.5 MG tablet Commonly known as: FEMARA TAKE 1 TABLET BY MOUTH ONCE DAILY   letrozole 2.5 MG tablet Commonly known as: FEMARA TAKE 1 TABLET DAILY   losartan 25 MG tablet Commonly known as: COZAAR Take 1 tablet by mouth daily What changed: Another medication with the same name was changed. Make sure you understand how and when to take each.   losartan 50 MG tablet Commonly known as: COZAAR Take 1 tablet (50 mg total) by mouth daily. What changed: when to take this   OVER THE COUNTER MEDICATION Take 1 tablet by mouth daily. CALCIUM 650MG  / VIT D3   rosuvastatin 5 MG tablet Commonly known as: Crestor Take 1 tablet (5 mg total) by mouth daily.   Synthroid 100 MCG tablet Generic drug: levothyroxine TAKE 1 TABLET DAILY BEFORE BREAKFAST        Allergies:  Allergies  Allergen Reactions   Lisinopril Swelling    Swelling in the throat   Iodine Hives  Penicillins Rash    Past Medical History, Surgical history, Social history, and Family History were reviewed and updated.  Review of Systems: Review of Systems  Constitutional: Negative.   HENT: Negative.    Eyes: Negative.   Respiratory: Negative.    Cardiovascular: Negative.   Gastrointestinal: Negative.   Genitourinary: Negative.   Musculoskeletal: Negative.   Skin: Negative.   Neurological: Negative.   Endo/Heme/Allergies: Negative.   Psychiatric/Behavioral: Negative.       Physical Exam:  height is 5' 7.5" (1.715 m) and weight is 190 lb 6.4 oz (86.4 kg). Her oral temperature is 98.3 F (36.8 C). Her blood pressure is 157/88 (abnormal) and her pulse is 73. Her respiration is 20 and oxygen saturation is 100%.   Wt Readings from Last 3 Encounters:  04/28/22 190 lb 6.4 oz (86.4 kg)  03/27/22 190 lb (86.2 kg)  10/26/21  190 lb 1.9 oz (86.2 kg)    Physical Exam Vitals reviewed.  Constitutional:      Comments: Her breast exam shows the right breast with the lumpectomy that is well-healed.  This is about the 10 o'clock position.  No obvious nodularity is noted in the right breast.  There is no right axillary adenopathy.  Left breast is unremarkable.  There is no left axillary adenopathy.  HENT:     Head: Normocephalic and atraumatic.  Eyes:     Pupils: Pupils are equal, round, and reactive to light.  Cardiovascular:     Rate and Rhythm: Normal rate and regular rhythm.     Heart sounds: Normal heart sounds.  Pulmonary:     Effort: Pulmonary effort is normal.     Breath sounds: Normal breath sounds.  Abdominal:     General: Bowel sounds are normal.     Palpations: Abdomen is soft.  Musculoskeletal:        General: No tenderness or deformity. Normal range of motion.     Cervical back: Normal range of motion.  Lymphadenopathy:     Cervical: No cervical adenopathy.  Skin:    General: Skin is warm and dry.     Findings: No erythema or rash.  Neurological:     Mental Status: She is alert and oriented to person, place, and time.  Psychiatric:        Behavior: Behavior normal.        Thought Content: Thought content normal.        Judgment: Judgment normal.      Lab Results  Component Value Date   WBC 4.5 04/28/2022   HGB 11.5 (L) 04/28/2022   HCT 35.2 (L) 04/28/2022   MCV 87.6 04/28/2022   PLT 156 04/28/2022   Lab Results  Component Value Date   FERRITIN 95 10/26/2021   IRON 81 10/26/2021   TIBC 347 10/26/2021   UIBC 266 10/26/2021   IRONPCTSAT 23 10/26/2021   Lab Results  Component Value Date   RETICCTPCT 1.0 04/28/2022   RBC 4.02 04/28/2022   RBC 4.03 04/28/2022   No results found for: "KPAFRELGTCHN", "LAMBDASER", "KAPLAMBRATIO" No results found for: "IGGSERUM", "IGA", "IGMSERUM" No results found for: "TOTALPROTELP", "ALBUMINELP", "A1GS", "A2GS", "BETS", "BETA2SER", "GAMS",  "MSPIKE", "SPEI"   Chemistry      Component Value Date/Time   NA 141 10/26/2021 0800   NA 141 05/31/2021 0000   NA 147 (H) 06/08/2017 0748   NA 140 12/07/2016 0836   K 4.3 10/26/2021 0800   K 4.1 06/08/2017 0748   K 4.5 12/07/2016 1610  CL 105 10/26/2021 0800   CL 109 (H) 06/08/2017 0748   CO2 30 10/26/2021 0800   CO2 29 06/08/2017 0748   CO2 28 12/07/2016 0836   BUN 33 (H) 10/26/2021 0800   BUN 20 05/31/2021 0000   BUN 39 (H) 06/08/2017 0748   BUN 34.7 (H) 12/07/2016 0836   CREATININE 2.15 (H) 10/26/2021 0800   CREATININE 2.1 (H) 06/08/2017 0748   CREATININE 2.2 (H) 12/07/2016 0836   GLU 93 05/31/2021 0000      Component Value Date/Time   CALCIUM 10.0 10/26/2021 0800   CALCIUM 9.3 06/08/2017 0748   CALCIUM 9.3 12/07/2016 0836   ALKPHOS 63 10/26/2021 0800   ALKPHOS 69 06/08/2017 0748   ALKPHOS 73 12/07/2016 0836   AST 29 10/26/2021 0800   AST 23 12/07/2016 0836   ALT 20 10/26/2021 0800   ALT 27 06/08/2017 0748   ALT 21 12/07/2016 0836   BILITOT 0.7 10/26/2021 0800   BILITOT 0.55 12/07/2016 0836     Impression and Plan: Stephanie Pierce is a 45 yo [place] American female with DCIS of the right breast diagnosed in July 2015 with lumpectomy.  She also had radiation therapy after the lumpectomy.  She is doing well on the Femara.  We will keep her on Femara for right now.  I think that her most active problem will be the kidney function.  Her kidney function is little bit lower today.  Again, her nephrologist is following her.  Her last erythropoietin level that we did 2 years ago was 14.  We can certainly use ESA if we ever needed to.  I will still plan to get her back in 6 months.    Volanda Napoleon, MD 9/1/20238:27 AM

## 2022-05-09 ENCOUNTER — Other Ambulatory Visit (HOSPITAL_COMMUNITY): Payer: Self-pay

## 2022-05-09 ENCOUNTER — Other Ambulatory Visit (HOSPITAL_BASED_OUTPATIENT_CLINIC_OR_DEPARTMENT_OTHER): Payer: Self-pay

## 2022-05-09 ENCOUNTER — Other Ambulatory Visit: Payer: Self-pay

## 2022-05-09 ENCOUNTER — Encounter: Payer: Self-pay | Admitting: Hematology & Oncology

## 2022-05-09 DIAGNOSIS — Z862 Personal history of diseases of the blood and blood-forming organs and certain disorders involving the immune mechanism: Secondary | ICD-10-CM

## 2022-05-09 MED ORDER — FUSION PLUS PO CAPS
1.0000 | ORAL_CAPSULE | Freq: Every day | ORAL | 3 refills | Status: DC
  Filled 2022-05-09 (×2): qty 30, 30d supply, fill #0
  Filled 2022-06-04 – 2022-06-05 (×2): qty 30, 30d supply, fill #1

## 2022-05-27 DIAGNOSIS — Z23 Encounter for immunization: Secondary | ICD-10-CM | POA: Diagnosis not present

## 2022-06-04 ENCOUNTER — Other Ambulatory Visit: Payer: Self-pay | Admitting: Family

## 2022-06-04 DIAGNOSIS — D0510 Intraductal carcinoma in situ of unspecified breast: Secondary | ICD-10-CM

## 2022-06-05 ENCOUNTER — Other Ambulatory Visit (HOSPITAL_BASED_OUTPATIENT_CLINIC_OR_DEPARTMENT_OTHER): Payer: Self-pay

## 2022-06-05 ENCOUNTER — Other Ambulatory Visit: Payer: Self-pay | Admitting: Hematology & Oncology

## 2022-06-05 DIAGNOSIS — Z862 Personal history of diseases of the blood and blood-forming organs and certain disorders involving the immune mechanism: Secondary | ICD-10-CM

## 2022-06-05 MED ORDER — LETROZOLE 2.5 MG PO TABS
2.5000 mg | ORAL_TABLET | Freq: Every day | ORAL | 6 refills | Status: DC
  Filled 2022-06-05: qty 30, 30d supply, fill #0

## 2022-06-05 MED ORDER — FUSION PLUS PO CAPS
1.0000 | ORAL_CAPSULE | Freq: Every day | ORAL | 3 refills | Status: DC
  Filled 2022-06-06: qty 30, 30d supply, fill #0
  Filled 2022-07-27: qty 30, 30d supply, fill #1
  Filled 2022-08-30: qty 30, 30d supply, fill #2
  Filled 2022-10-13: qty 30, 30d supply, fill #3

## 2022-06-06 ENCOUNTER — Other Ambulatory Visit (HOSPITAL_BASED_OUTPATIENT_CLINIC_OR_DEPARTMENT_OTHER): Payer: Self-pay

## 2022-06-16 DIAGNOSIS — H35373 Puckering of macula, bilateral: Secondary | ICD-10-CM | POA: Diagnosis not present

## 2022-06-16 DIAGNOSIS — H2513 Age-related nuclear cataract, bilateral: Secondary | ICD-10-CM | POA: Diagnosis not present

## 2022-06-16 DIAGNOSIS — H35341 Macular cyst, hole, or pseudohole, right eye: Secondary | ICD-10-CM | POA: Diagnosis not present

## 2022-06-16 DIAGNOSIS — H04123 Dry eye syndrome of bilateral lacrimal glands: Secondary | ICD-10-CM | POA: Diagnosis not present

## 2022-06-17 DIAGNOSIS — Z23 Encounter for immunization: Secondary | ICD-10-CM | POA: Diagnosis not present

## 2022-07-18 DIAGNOSIS — H2513 Age-related nuclear cataract, bilateral: Secondary | ICD-10-CM | POA: Diagnosis not present

## 2022-07-18 DIAGNOSIS — H2511 Age-related nuclear cataract, right eye: Secondary | ICD-10-CM | POA: Diagnosis not present

## 2022-07-27 ENCOUNTER — Other Ambulatory Visit (HOSPITAL_BASED_OUTPATIENT_CLINIC_OR_DEPARTMENT_OTHER): Payer: Self-pay

## 2022-08-01 DIAGNOSIS — H2511 Age-related nuclear cataract, right eye: Secondary | ICD-10-CM | POA: Diagnosis not present

## 2022-08-01 DIAGNOSIS — H269 Unspecified cataract: Secondary | ICD-10-CM | POA: Diagnosis not present

## 2022-08-10 DIAGNOSIS — H2512 Age-related nuclear cataract, left eye: Secondary | ICD-10-CM | POA: Diagnosis not present

## 2022-08-30 ENCOUNTER — Other Ambulatory Visit (HOSPITAL_BASED_OUTPATIENT_CLINIC_OR_DEPARTMENT_OTHER): Payer: Self-pay

## 2022-09-12 ENCOUNTER — Other Ambulatory Visit: Payer: Self-pay | Admitting: Family Medicine

## 2022-09-12 DIAGNOSIS — Z853 Personal history of malignant neoplasm of breast: Secondary | ICD-10-CM

## 2022-09-19 DIAGNOSIS — H269 Unspecified cataract: Secondary | ICD-10-CM | POA: Diagnosis not present

## 2022-09-19 DIAGNOSIS — H2512 Age-related nuclear cataract, left eye: Secondary | ICD-10-CM | POA: Diagnosis not present

## 2022-09-19 DIAGNOSIS — H278 Other specified disorders of lens: Secondary | ICD-10-CM | POA: Diagnosis not present

## 2022-10-13 ENCOUNTER — Other Ambulatory Visit (HOSPITAL_BASED_OUTPATIENT_CLINIC_OR_DEPARTMENT_OTHER): Payer: Self-pay

## 2022-10-27 ENCOUNTER — Other Ambulatory Visit (HOSPITAL_BASED_OUTPATIENT_CLINIC_OR_DEPARTMENT_OTHER): Payer: Self-pay

## 2022-10-27 ENCOUNTER — Inpatient Hospital Stay (HOSPITAL_BASED_OUTPATIENT_CLINIC_OR_DEPARTMENT_OTHER): Payer: Medicare Other | Admitting: Hematology & Oncology

## 2022-10-27 ENCOUNTER — Other Ambulatory Visit: Payer: Self-pay

## 2022-10-27 ENCOUNTER — Encounter: Payer: Self-pay | Admitting: Hematology & Oncology

## 2022-10-27 ENCOUNTER — Inpatient Hospital Stay: Payer: Medicare Other | Attending: Hematology & Oncology

## 2022-10-27 ENCOUNTER — Other Ambulatory Visit: Payer: Self-pay | Admitting: Hematology & Oncology

## 2022-10-27 VITALS — BP 135/83 | HR 73 | Temp 98.0°F | Resp 16 | Wt 195.0 lb

## 2022-10-27 DIAGNOSIS — D0511 Intraductal carcinoma in situ of right breast: Secondary | ICD-10-CM | POA: Diagnosis not present

## 2022-10-27 DIAGNOSIS — Z79811 Long term (current) use of aromatase inhibitors: Secondary | ICD-10-CM | POA: Diagnosis not present

## 2022-10-27 DIAGNOSIS — N289 Disorder of kidney and ureter, unspecified: Secondary | ICD-10-CM | POA: Diagnosis not present

## 2022-10-27 DIAGNOSIS — D0510 Intraductal carcinoma in situ of unspecified breast: Secondary | ICD-10-CM

## 2022-10-27 DIAGNOSIS — Z862 Personal history of diseases of the blood and blood-forming organs and certain disorders involving the immune mechanism: Secondary | ICD-10-CM

## 2022-10-27 LAB — IRON AND IRON BINDING CAPACITY (CC-WL,HP ONLY)
Iron: 68 ug/dL (ref 28–170)
Saturation Ratios: 21 % (ref 10.4–31.8)
TIBC: 319 ug/dL (ref 250–450)
UIBC: 251 ug/dL (ref 148–442)

## 2022-10-27 LAB — CBC WITH DIFFERENTIAL (CANCER CENTER ONLY)
Abs Immature Granulocytes: 0.03 10*3/uL (ref 0.00–0.07)
Basophils Absolute: 0 10*3/uL (ref 0.0–0.1)
Basophils Relative: 1 %
Eosinophils Absolute: 0.2 10*3/uL (ref 0.0–0.5)
Eosinophils Relative: 3 %
HCT: 35.8 % — ABNORMAL LOW (ref 36.0–46.0)
Hemoglobin: 11.7 g/dL — ABNORMAL LOW (ref 12.0–15.0)
Immature Granulocytes: 1 %
Lymphocytes Relative: 35 %
Lymphs Abs: 1.7 10*3/uL (ref 0.7–4.0)
MCH: 28.3 pg (ref 26.0–34.0)
MCHC: 32.7 g/dL (ref 30.0–36.0)
MCV: 86.7 fL (ref 80.0–100.0)
Monocytes Absolute: 0.6 10*3/uL (ref 0.1–1.0)
Monocytes Relative: 13 %
Neutro Abs: 2.2 10*3/uL (ref 1.7–7.7)
Neutrophils Relative %: 47 %
Platelet Count: 184 10*3/uL (ref 150–400)
RBC: 4.13 MIL/uL (ref 3.87–5.11)
RDW: 14 % (ref 11.5–15.5)
WBC Count: 4.7 10*3/uL (ref 4.0–10.5)
nRBC: 0 % (ref 0.0–0.2)

## 2022-10-27 LAB — CMP (CANCER CENTER ONLY)
ALT: 20 U/L (ref 0–44)
AST: 24 U/L (ref 15–41)
Albumin: 4.2 g/dL (ref 3.5–5.0)
Alkaline Phosphatase: 70 U/L (ref 38–126)
Anion gap: 8 (ref 5–15)
BUN: 38 mg/dL — ABNORMAL HIGH (ref 8–23)
CO2: 29 mmol/L (ref 22–32)
Calcium: 10.3 mg/dL (ref 8.9–10.3)
Chloride: 103 mmol/L (ref 98–111)
Creatinine: 2.72 mg/dL — ABNORMAL HIGH (ref 0.44–1.00)
GFR, Estimated: 18 mL/min — ABNORMAL LOW (ref >60)
Glucose, Bld: 97 mg/dL (ref 70–99)
Potassium: 4.5 mmol/L (ref 3.5–5.1)
Sodium: 140 mmol/L (ref 135–145)
Total Bilirubin: 0.7 mg/dL (ref 0.3–1.2)
Total Protein: 7.8 g/dL (ref 6.5–8.1)

## 2022-10-27 LAB — RETICULOCYTES
Immature Retic Fract: 5.2 % (ref 2.3–15.9)
RBC.: 4.02 MIL/uL (ref 3.87–5.11)
Retic Count, Absolute: 44.2 10*3/uL (ref 19.0–186.0)
Retic Ct Pct: 1.1 % (ref 0.4–3.1)

## 2022-10-27 LAB — FERRITIN: Ferritin: 120 ng/mL (ref 11–307)

## 2022-10-27 MED ORDER — FUSION PLUS PO CAPS
1.0000 | ORAL_CAPSULE | Freq: Every day | ORAL | 3 refills | Status: DC
  Filled 2022-10-27 – 2022-11-17 (×3): qty 30, 30d supply, fill #0
  Filled 2022-12-26: qty 30, 30d supply, fill #1
  Filled 2022-12-27: qty 60, 60d supply, fill #1

## 2022-10-27 NOTE — Progress Notes (Signed)
Hematology and Oncology Follow Up Visit  Stephanie Pierce [id number] 1951-01-02 72 y.o. 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 daily   Interim History: Stephanie Pierce is here today for follow-up.  We saw Stephanie Pierce 6 months ago.  Stephanie Pierce been doing quite well.  Stephanie Pierce has been working.  Stephanie Pierce is busy at work.  Stephanie Pierce had no problems over the Holiday season.  Thankfully, Stephanie Pierce had no problems with COVID.  Stephanie Pierce does see a nephrologist for Stephanie Pierce kidneys.  Everything seems to be relatively stable with Stephanie Pierce kidney function.  Stephanie Pierce has had no bleeding.  There is been no cough or shortness of breath.    Stephanie Pierce last iron studies that were done back in September showed a ferritin of 84 with an iron saturation of 19%.  Stephanie Pierce is on some oral iron.  Stephanie Pierce is due for mammogram in May.  Stephanie Pierce is on Femara.  Stephanie Pierce is doing okay on Femara.   Stephanie Pierce last bone density test was back in May 2023.  Everything looks normal without any evidence of osteo pia.  Overall, I would say that Stephanie Pierce performance status is probably ECOG 1.   Medications:  Allergies as of 10/27/2022       Reactions   Lisinopril Swelling   Swelling in the throat   Iodine Hives   Penicillins Rash        Medication List        Accurate as of October 27, 2022  8:14 AM. If you have any questions, ask your nurse or doctor.          Azelastine HCl 137 MCG/SPRAY Soln Place 1-2 sprays into both nostrils 2 (two) times daily. Use in each nostril as directed   B-12 2500 MCG Tabs Take 1 tablet by mouth once a week.   CENTRUM SILVER ADULT 50+ PO Take 1 tablet by mouth every other day.   Fluad Quadrivalent 0.5 ML injection Generic drug: influenza vaccine adjuvanted Inject into the muscle.   fluticasone 50 MCG/ACT nasal spray Commonly known as: FLONASE Place 2 sprays into both nostrils daily.   Fusion Plus Caps Take 1 capsule by mouth  daily.   KRILL OIL OMEGA-3 PO Take by mouth daily.   letrozole 2.5 MG tablet Commonly known as: FEMARA TAKE 1 TABLET DAILY   letrozole 2.5 MG tablet Commonly known as: FEMARA Take 1 tablet (2.5 mg total) by mouth daily.   losartan 25 MG tablet Commonly known as: COZAAR Take 1 tablet by mouth daily What changed: Another medication with the same name was changed. Make sure you understand how and when to take each.   losartan 50 MG tablet Commonly known as: COZAAR Take 1 tablet (50 mg total) by mouth daily. What changed: when to take this   OVER THE COUNTER MEDICATION Take 1 tablet by mouth daily. CALCIUM '650MG'$  / VIT D3   rosuvastatin 5 MG tablet Commonly known as: Crestor Take 1 tablet (5 mg total) by mouth daily.   Synthroid 100 MCG tablet Generic drug: levothyroxine TAKE 1 TABLET DAILY BEFORE BREAKFAST        Allergies:  Allergies  Allergen Reactions   Lisinopril Swelling    Swelling in the throat   Iodine Hives   Penicillins Rash    Past Medical History, Surgical history, Social history, and Family History were reviewed and updated.  Review of Systems: Review  of Systems  Constitutional: Negative.   HENT: Negative.    Eyes: Negative.   Respiratory: Negative.    Cardiovascular: Negative.   Gastrointestinal: Negative.   Genitourinary: Negative.   Musculoskeletal: Negative.   Skin: Negative.   Neurological: Negative.   Endo/Heme/Allergies: Negative.   Psychiatric/Behavioral: Negative.       Physical Exam: Stephanie Pierce vital signs are temperature of 98.  Pulse 73.  Blood pressure 135/83.  Weight is 195 pounds.  Wt Readings from Last 3 Encounters:  04/28/22 190 lb 6.4 oz (86.4 kg)  03/27/22 190 lb (86.2 kg)  10/26/21 190 lb 1.9 oz (86.2 kg)    Physical Exam Vitals reviewed.  Constitutional:      Comments: Stephanie Pierce breast exam shows the right breast with the lumpectomy that is well-healed.  This is about the 10 o'clock position.  No obvious nodularity is  noted in the right breast.  There is no right axillary adenopathy.  Left breast is unremarkable.  There is no left axillary adenopathy.  HENT:     Head: Normocephalic and atraumatic.  Eyes:     Pupils: Pupils are equal, round, and reactive to light.  Cardiovascular:     Rate and Rhythm: Normal rate and regular rhythm.     Heart sounds: Normal heart sounds.  Pulmonary:     Effort: Pulmonary effort is normal.     Breath sounds: Normal breath sounds.  Abdominal:     General: Bowel sounds are normal.     Palpations: Abdomen is soft.  Musculoskeletal:        General: No tenderness or deformity. Normal range of motion.     Cervical back: Normal range of motion.  Lymphadenopathy:     Cervical: No cervical adenopathy.  Skin:    General: Skin is warm and dry.     Findings: No erythema or rash.  Neurological:     Mental Status: Stephanie Pierce is alert and oriented to person, place, and time.  Psychiatric:        Behavior: Behavior normal.        Thought Content: Thought content normal.        Judgment: Judgment normal.      Lab Results  Component Value Date   WBC 4.7 10/27/2022   HGB 11.7 (L) 10/27/2022   HCT 35.8 (L) 10/27/2022   MCV 86.7 10/27/2022   PLT 184 10/27/2022   Lab Results  Component Value Date   FERRITIN 84 04/28/2022   IRON 59 04/28/2022   TIBC 307 04/28/2022   UIBC 248 04/28/2022   IRONPCTSAT 19 04/28/2022   Lab Results  Component Value Date   RETICCTPCT 1.1 10/27/2022   RBC 4.02 10/27/2022   No results found for: "KPAFRELGTCHN", "LAMBDASER", "KAPLAMBRATIO" No results found for: "IGGSERUM", "IGA", "IGMSERUM" No results found for: "TOTALPROTELP", "ALBUMINELP", "A1GS", "A2GS", "BETS", "BETA2SER", "GAMS", "MSPIKE", "SPEI"   Chemistry      Component Value Date/Time   NA 141 04/28/2022 0800   NA 141 05/31/2021 0000   NA 147 (H) 06/08/2017 0748   NA 140 12/07/2016 0836   K 4.0 04/28/2022 0800   K 4.1 06/08/2017 0748   K 4.5 12/07/2016 0836   CL 105 04/28/2022  0800   CL 109 (H) 06/08/2017 0748   CO2 30 04/28/2022 0800   CO2 29 06/08/2017 0748   CO2 28 12/07/2016 0836   BUN 36 (H) 04/28/2022 0800   BUN 20 05/31/2021 0000   BUN 39 (H) 06/08/2017 0748   BUN 34.7 (H) 12/07/2016  [id number]   CREATININE 2.34 (H) 04/28/2022 0800   CREATININE 2.1 (H) 06/08/2017 0748   CREATININE 2.2 (H) 12/07/2016 0836   GLU 93 05/31/2021 0000      Component Value Date/Time   CALCIUM 10.3 04/28/2022 0800   CALCIUM 9.3 06/08/2017 0748   CALCIUM 9.3 12/07/2016 0836   ALKPHOS 63 04/28/2022 0800   ALKPHOS 69 06/08/2017 0748   ALKPHOS 73 12/07/2016 0836   AST 28 04/28/2022 0800   AST 23 12/07/2016 0836   ALT 20 04/28/2022 0800   ALT 27 06/08/2017 0748   ALT 21 12/07/2016 0836   BILITOT 0.7 04/28/2022 0800   BILITOT 0.55 12/07/2016 0836     Impression and Plan: Stephanie Pierce is a 67 yo [place] American female with DCIS of the right breast diagnosed in July 2015 with lumpectomy.  Stephanie Pierce also had radiation therapy after the lumpectomy.  Stephanie Pierce is doing well on the Femara.  We will keep Stephanie Pierce on Femara for right now.  From my point of view, everything looks pretty good.  We will have to see what Stephanie Pierce iron studies look like.  We will have to see what Stephanie Pierce renal function looks like.  I can still plan to get Stephanie Pierce back in 6 months.  Volanda Napoleon, MD 3/1/20248:14 AM

## 2022-10-30 ENCOUNTER — Other Ambulatory Visit: Payer: Self-pay | Admitting: Family Medicine

## 2022-11-08 ENCOUNTER — Other Ambulatory Visit (HOSPITAL_BASED_OUTPATIENT_CLINIC_OR_DEPARTMENT_OTHER): Payer: Self-pay

## 2022-11-16 ENCOUNTER — Other Ambulatory Visit (HOSPITAL_BASED_OUTPATIENT_CLINIC_OR_DEPARTMENT_OTHER): Payer: Self-pay

## 2022-11-17 ENCOUNTER — Other Ambulatory Visit (HOSPITAL_BASED_OUTPATIENT_CLINIC_OR_DEPARTMENT_OTHER): Payer: Self-pay

## 2022-12-06 ENCOUNTER — Telehealth: Payer: Medicare Other | Admitting: Nurse Practitioner

## 2022-12-06 ENCOUNTER — Encounter: Payer: Self-pay | Admitting: Nurse Practitioner

## 2022-12-06 NOTE — Progress Notes (Unsigned)
Patient is not reachable during time of scheduled video visit. Call attempted to mobile/home without answer. Mychart message sent

## 2022-12-13 DIAGNOSIS — N1832 Chronic kidney disease, stage 3b: Secondary | ICD-10-CM | POA: Diagnosis not present

## 2022-12-13 DIAGNOSIS — N184 Chronic kidney disease, stage 4 (severe): Secondary | ICD-10-CM | POA: Diagnosis not present

## 2022-12-13 DIAGNOSIS — D631 Anemia in chronic kidney disease: Secondary | ICD-10-CM | POA: Diagnosis not present

## 2022-12-13 DIAGNOSIS — I129 Hypertensive chronic kidney disease with stage 1 through stage 4 chronic kidney disease, or unspecified chronic kidney disease: Secondary | ICD-10-CM | POA: Diagnosis not present

## 2022-12-19 DIAGNOSIS — R801 Persistent proteinuria, unspecified: Secondary | ICD-10-CM | POA: Diagnosis not present

## 2022-12-19 DIAGNOSIS — M898X9 Other specified disorders of bone, unspecified site: Secondary | ICD-10-CM | POA: Diagnosis not present

## 2022-12-19 DIAGNOSIS — N183 Chronic kidney disease, stage 3 unspecified: Secondary | ICD-10-CM | POA: Diagnosis not present

## 2022-12-19 DIAGNOSIS — E559 Vitamin D deficiency, unspecified: Secondary | ICD-10-CM | POA: Diagnosis not present

## 2022-12-19 DIAGNOSIS — I129 Hypertensive chronic kidney disease with stage 1 through stage 4 chronic kidney disease, or unspecified chronic kidney disease: Secondary | ICD-10-CM | POA: Diagnosis not present

## 2022-12-26 ENCOUNTER — Other Ambulatory Visit (HOSPITAL_BASED_OUTPATIENT_CLINIC_OR_DEPARTMENT_OTHER): Payer: Self-pay

## 2022-12-27 ENCOUNTER — Other Ambulatory Visit (HOSPITAL_BASED_OUTPATIENT_CLINIC_OR_DEPARTMENT_OTHER): Payer: Self-pay

## 2023-01-22 ENCOUNTER — Encounter: Payer: Self-pay | Admitting: Hematology & Oncology

## 2023-01-28 NOTE — Assessment & Plan Note (Signed)
On Levothyroxine, continue to monitor 

## 2023-01-28 NOTE — Assessment & Plan Note (Signed)
Supplement and monitor 

## 2023-01-28 NOTE — Assessment & Plan Note (Signed)
Hydrate and monitor 

## 2023-01-29 ENCOUNTER — Ambulatory Visit (INDEPENDENT_AMBULATORY_CARE_PROVIDER_SITE_OTHER): Payer: Medicare Other | Admitting: Family Medicine

## 2023-01-29 ENCOUNTER — Encounter: Payer: Self-pay | Admitting: Family Medicine

## 2023-01-29 VITALS — BP 130/80 | HR 64 | Temp 98.0°F | Resp 16 | Ht 67.0 in | Wt 194.4 lb

## 2023-01-29 DIAGNOSIS — E039 Hypothyroidism, unspecified: Secondary | ICD-10-CM | POA: Diagnosis not present

## 2023-01-29 DIAGNOSIS — E559 Vitamin D deficiency, unspecified: Secondary | ICD-10-CM

## 2023-01-29 DIAGNOSIS — D0510 Intraductal carcinoma in situ of unspecified breast: Secondary | ICD-10-CM

## 2023-01-29 DIAGNOSIS — N289 Disorder of kidney and ureter, unspecified: Secondary | ICD-10-CM | POA: Diagnosis not present

## 2023-01-29 DIAGNOSIS — E538 Deficiency of other specified B group vitamins: Secondary | ICD-10-CM

## 2023-01-29 DIAGNOSIS — Z0000 Encounter for general adult medical examination without abnormal findings: Secondary | ICD-10-CM | POA: Diagnosis not present

## 2023-01-29 DIAGNOSIS — Z129 Encounter for screening for malignant neoplasm, site unspecified: Secondary | ICD-10-CM

## 2023-01-29 NOTE — Assessment & Plan Note (Signed)
Has a mgm on January 31, 2023

## 2023-01-29 NOTE — Patient Instructions (Signed)
Preventive Care 65 Years and Older, Female Preventive care refers to lifestyle choices and visits with your health care provider that can promote health and wellness. Preventive care visits are also called wellness exams. What can I expect for my preventive care visit? Counseling Your health care provider may ask you questions about your: Medical history, including: Past medical problems. Family medical history. Pregnancy and menstrual history. History of falls. Current health, including: Memory and ability to understand (cognition). Emotional well-being. Home life and relationship well-being. Sexual activity and sexual health. Lifestyle, including: Alcohol, nicotine or tobacco, and drug use. Access to firearms. Diet, exercise, and sleep habits. Work and work environment. Sunscreen use. Safety issues such as seatbelt and bike helmet use. Physical exam Your health care provider will check your: Height and weight. These may be used to calculate your BMI (body mass index). BMI is a measurement that tells if you are at a healthy weight. Waist circumference. This measures the distance around your waistline. This measurement also tells if you are at a healthy weight and may help predict your risk of certain diseases, such as type 2 diabetes and high blood pressure. Heart rate and blood pressure. Body temperature. Skin for abnormal spots. What immunizations do I need?  Vaccines are usually given at various ages, according to a schedule. Your health care provider will recommend vaccines for you based on your age, medical history, and lifestyle or other factors, such as travel or where you work. What tests do I need? Screening Your health care provider may recommend screening tests for certain conditions. This may include: Lipid and cholesterol levels. Hepatitis C test. Hepatitis B test. HIV (human immunodeficiency virus) test. STI (sexually transmitted infection) testing, if you are at  risk. Lung cancer screening. Colorectal cancer screening. Diabetes screening. This is done by checking your blood sugar (glucose) after you have not eaten for a while (fasting). Mammogram. Talk with your health care provider about how often you should have regular mammograms. BRCA-related cancer screening. This may be done if you have a family history of breast, ovarian, tubal, or peritoneal cancers. Bone density scan. This is done to screen for osteoporosis. Talk with your health care provider about your test results, treatment options, and if necessary, the need for more tests. Follow these instructions at home: Eating and drinking  Eat a diet that includes fresh fruits and vegetables, whole grains, lean protein, and low-fat dairy products. Limit your intake of foods with high amounts of sugar, saturated fats, and salt. Take vitamin and mineral supplements as recommended by your health care provider. Do not drink alcohol if your health care provider tells you not to drink. If you drink alcohol: Limit how much you have to 0-1 drink a day. Know how much alcohol is in your drink. In the [place] one drink equals one 12 oz bottle of beer (355 mL), one 5 oz glass of wine (148 mL), or one 1 oz glass of hard liquor (44 mL). Lifestyle Brush your teeth every morning and night with fluoride toothpaste. Floss one time each day. Exercise for at least 30 minutes 5 or more days each week. Do not use any products that contain nicotine or tobacco. These products include cigarettes, chewing tobacco, and vaping devices, such as e-cigarettes. If you need help quitting, ask your health care provider. Do not use drugs. If you are sexually active, practice safe sex. Use a condom or other form of protection in order to prevent STIs. Take aspirin only as told by   your health care provider. Make sure that you understand how much to take and what form to take. Work with your health care provider to find out whether it  is safe and beneficial for you to take aspirin daily. Ask your health care provider if you need to take a cholesterol-lowering medicine (statin). Find healthy ways to manage stress, such as: Meditation, yoga, or listening to music. Journaling. Talking to a trusted person. Spending time with friends and family. Minimize exposure to UV radiation to reduce your risk of skin cancer. Safety Always wear your seat belt while driving or riding in a vehicle. Do not drive: If you have been drinking alcohol. Do not ride with someone who has been drinking. When you are tired or distracted. While texting. If you have been using any mind-altering substances or drugs. Wear a helmet and other protective equipment during sports activities. If you have firearms in your house, make sure you follow all gun safety procedures. What's next? Visit your health care provider once a year for an annual wellness visit. Ask your health care provider how often you should have your eyes and teeth checked. Stay up to date on all vaccines. This information is not intended to replace advice given to you by your health care provider. Make sure you discuss any questions you have with your health care provider. Document Revised: 02/09/2021 Document Reviewed: 02/09/2021 Elsevier Patient Education  2024 [employer]  

## 2023-01-29 NOTE — Assessment & Plan Note (Addendum)
Patient encouraged to maintain heart healthy diet, regular exercise, adequate sleep. Consider daily probiotics. Take medications as prescribed. Labs ordered and reviewed. Given and reviewed copy of ACP documents from [place] Secretary of State and encouraged to complete and return 

## 2023-01-29 NOTE — Progress Notes (Signed)
Subjective:    Patient ID: Stephanie Pierce, female    DOB: [birth year], 72 y.o.   MRN: [id number]  Chief Complaint  Patient presents with   Annual Exam    Annual Exam    HPI  Discussed the use of AI scribe software for clinical note transcription with the patient, who gave verbal consent to proceed.  History of Present Illness           Patient is in today for follow up on chronic concerns and annual preventative exam. No recent febrile illness or hospitalizations. Denies CP/palp/SOB/HA/congestion/fevers/GI or GU c/o. Taking meds as prescribed. SHe has a past medical history of renal disease, allergies, hypothyroidism, gastric bypass and more. She recently had cataract surgery and did well. She tries to eat well and stay active. She tries to eat enough protein and hydrate well while not over doing the protein and hurting her kidneys.       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   Enlarged thyroid gland    H/O iron deficiency anemia 03/23/2015   History of chicken pox 03/23/2015   Hyperlipidemia, mixed 03/23/2015   Hypothyroidism 03/23/2015   Overweight 03/23/2015   Personal history of radiation therapy    Pneumonia 2012   Renal insufficiency 03/23/2015   S/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   Vitamin D deficiency 05/12/2015    Past Surgical History:  Procedure Laterality Date   BREAST LUMPECTOMY Right    h/o 2 needle biopsy, in 2015 right Lumpectomy DCIS stage 0   BREAST LUMPECTOMY WITH RADIOACTIVE SEED LOCALIZATION Right 02/07/2019   Procedure: RIGHT BREAST LUMPECTOMY WITH RADIOACTIVE SEED LOCALIZATION;  Surgeon: Ovidio Kin, MD;  Location:  [facility];  Service: General;  Laterality: Right;   CESAREAN SECTION  1985   EYE  SURGERY     LAPAROSCOPIC GASTRIC SLEEVE RESECTION      Family History  Problem Relation Age of Onset   Hypertension Father    Heart attack Father    Alcohol abuse Father    Cancer Sister        metastatic breast cancer   Diabetes Sister    Hypertension Sister    Breast cancer Sister    Diabetes Brother    Other Brother        complications of exposure to agent orange   Stroke Brother    Diverticulosis Daughter     Social History   Socioeconomic History   Marital status: Married    Spouse name: Not on file   Number of children: Not on file   Years of education: 18   Highest education level: Doctorate  Occupational History   Occupation: Scientist, physiological  Tobacco Use   Smoking status: Never   Smokeless tobacco: Never  Vaping Use   Vaping Use: Never used  Substance and Sexual Activity   Alcohol use: No    Alcohol/week: 0.0 standard drinks of alcohol   Drug use: No   Sexual activity: Yes    Comment: 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, RN case Manager, no dietary restrictions follows heart healthy diet with 60 to 80 gm of protein  Other Topics Concern   Not on file  Social History Narrative  Not on file   Social Determinants of Health   Financial Resource Strain: Low Risk  (01/22/2023)   Overall Financial Resource Strain (CARDIA)    Difficulty of Paying Living Expenses: Not hard at all  Food Insecurity: No Food Insecurity (01/22/2023)   Hunger Vital Sign    Worried About Running Out of Food in the Last Year: Never true    Ran Out of Food in the Last Year: Never true  Transportation Needs: No Transportation Needs (01/22/2023)   PRAPARE - Administrator, Civil Service (Medical): No    Lack of Transportation (Non-Medical): No  Physical Activity: Insufficiently Active (01/22/2023)   Exercise Vital Sign    Days of Exercise per Week: 2 days    Minutes of Exercise per Session: 20 min  Stress: No Stress Concern Present (01/22/2023)   [employer] of Occupational Health  - Occupational Stress Questionnaire    Feeling of Stress : Not at all  Social Connections: Socially Integrated (01/22/2023)   Social Connection and Isolation Panel [NHANES]    Frequency of Communication with Friends and Family: More than three times a week    Frequency of Social Gatherings with Friends and Family: Once a week    Attends Religious Services: 1 to 4 times per year    Active Member of [employer] or Organizations: Yes    Attends Banker Meetings: Never    Marital Status: Married  Catering manager Violence: Not At Risk (03/27/2022)   Humiliation, Afraid, Rape, and Kick questionnaire    Fear of Current or Ex-Partner: No    Emotionally Abused: No    Physically Abused: No    Sexually Abused: No    Outpatient Medications Prior to Visit  Medication Sig Dispense Refill   Cyanocobalamin (B-12) 2500 MCG TABS Take 1 tablet by mouth once a week. 12 tablet 5   fluticasone (FLONASE) 50 MCG/ACT nasal spray Place 2 sprays into both nostrils daily. 48 g 1   Iron-FA-B Cmp-C-Biot-Probiotic (FUSION PLUS) CAPS Take 1 capsule by mouth daily. 30 capsule 3   KRILL OIL OMEGA-3 PO Take by mouth daily.      letrozole (FEMARA) 2.5 MG tablet TAKE 1 TABLET DAILY 90 tablet 3   Multiple Vitamins-Minerals (CENTRUM SILVER ADULT 50+ PO) Take 1 tablet by mouth every other day.     OVER THE COUNTER MEDICATION Take 1 tablet by mouth daily. CALCIUM 650MG  / VIT D3     SYNTHROID 100 MCG tablet TAKE 1 TABLET DAILY BEFORE BREAKFAST 90 tablet 3   losartan (COZAAR) 50 MG tablet Take 50 mg by mouth 2 (two) times daily.     azelastine (ASTELIN) 0.1 % nasal spray Place 1-2 sprays into both nostrils 2 (two) times daily. Use in each nostril as directed (Patient not taking: Reported on 04/28/2021) 30 mL 12   influenza vaccine adjuvanted (FLUAD) 0.5 ML injection Inject into the muscle. (Patient not taking: Reported on 10/26/2021) 0.5 mL 0   letrozole (FEMARA) 2.5 MG tablet Take 1 tablet (2.5 mg total) by mouth daily. 30  tablet 6   losartan (COZAAR) 25 MG tablet Take 1 tablet by mouth daily (Patient not taking: Reported on 10/26/2021) 30 tablet 11   losartan (COZAAR) 50 MG tablet Take 1 tablet (50 mg total) by mouth daily. (Patient taking differently: Take 50 mg by mouth in the morning and at bedtime.) 90 tablet 1   rosuvastatin (CRESTOR) 5 MG tablet Take 1 tablet (5 mg total) by mouth daily. (Patient not  taking: Reported on 04/28/2021) 90 tablet 1   No facility-administered medications prior to visit.    Allergies  Allergen Reactions   Lisinopril Swelling    Swelling in the throat   Iodine Hives   Penicillins Rash    Review of Systems  Constitutional:  Negative for chills, fever and malaise/fatigue.  HENT:  Negative for congestion and hearing loss.   Eyes:  Negative for discharge.  Respiratory:  Negative for cough, sputum production and shortness of breath.   Cardiovascular:  Negative for chest pain, palpitations and leg swelling.  Gastrointestinal:  Negative for abdominal pain, blood in stool, constipation, diarrhea, heartburn, nausea and vomiting.  Genitourinary:  Negative for dysuria, frequency, hematuria and urgency.  Musculoskeletal:  Negative for back pain, falls and myalgias.  Skin:  Negative for rash.  Neurological:  Negative for dizziness, sensory change, loss of consciousness, weakness and headaches.  Endo/Heme/Allergies:  Negative for environmental allergies. Does not bruise/bleed easily.  Psychiatric/Behavioral:  Negative for depression and suicidal ideas. The patient is not nervous/anxious and does not have insomnia.        Objective:    Physical Exam Constitutional:      General: She is not in acute distress.    Appearance: Normal appearance. She is not diaphoretic.  HENT:     Head: Normocephalic and atraumatic.     Right Ear: Tympanic membrane, ear canal and external ear normal.     Left Ear: Tympanic membrane, ear canal and external ear normal.     Nose: Nose normal.      Mouth/Throat:     Mouth: Mucous membranes are moist.     Pharynx: Oropharynx is clear. No oropharyngeal exudate.  Eyes:     General: No scleral icterus.       Right eye: No discharge.        Left eye: No discharge.     Conjunctiva/sclera: Conjunctivae normal.     Pupils: Pupils are equal, round, and reactive to light.  Neck:     Thyroid: No thyromegaly.  Cardiovascular:     Rate and Rhythm: Normal rate and regular rhythm.     Heart sounds: Normal heart sounds. No murmur heard. Pulmonary:     Effort: Pulmonary effort is normal. No respiratory distress.     Breath sounds: Normal breath sounds. No wheezing or rales.  Abdominal:     General: Bowel sounds are normal. There is no distension.     Palpations: Abdomen is soft. There is no mass.     Tenderness: There is no abdominal tenderness.  Musculoskeletal:        General: No tenderness. Normal range of motion.     Cervical back: Normal range of motion and neck supple.  Lymphadenopathy:     Cervical: No cervical adenopathy.  Skin:    General: Skin is warm and dry.     Findings: No rash.  Neurological:     General: No focal deficit present.     Mental Status: She is alert and oriented to person, place, and time.     Cranial Nerves: No cranial nerve deficit.     Coordination: Coordination normal.     Deep Tendon Reflexes: Reflexes are normal and symmetric. Reflexes normal.  Psychiatric:        Mood and Affect: Mood normal.        Behavior: Behavior normal.        Thought Content: Thought content normal.        Judgment: Judgment normal.  BP 130/80 (BP Location: Left Arm, Patient Position: Sitting, Cuff Size: Normal)   Pulse 64   Temp 98 F (36.7 C) (Oral)   Resp 16   Ht 5\' 7"  (1.702 m)   Wt 194 lb 6.4 oz (88.2 kg)   SpO2 95%   BMI 30.45 kg/m  Wt Readings from Last 3 Encounters:  01/29/23 194 lb 6.4 oz (88.2 kg)  10/27/22 195 lb (88.5 kg)  04/28/22 190 lb 6.4 oz (86.4 kg)    Diabetic Foot Exam - Simple   No data  filed    Lab Results  Component Value Date   WBC 4.7 10/27/2022   HGB 11.7 (L) 10/27/2022   HCT 35.8 (L) 10/27/2022   PLT 184 10/27/2022   GLUCOSE 97 10/27/2022   CHOL 285 (H) 04/14/2021   TRIG 44.0 04/14/2021   HDL 137.60 04/14/2021   LDLCALC 139 (H) 04/14/2021   ALT 20 10/27/2022   AST 24 10/27/2022   NA 140 10/27/2022   K 4.5 10/27/2022   CL 103 10/27/2022   CREATININE 2.72 (H) 10/27/2022   BUN 38 (H) 10/27/2022   CO2 29 10/27/2022   TSH 3.47 04/14/2021    Lab Results  Component Value Date   TSH 3.47 04/14/2021   Lab Results  Component Value Date   WBC 4.7 10/27/2022   HGB 11.7 (L) 10/27/2022   HCT 35.8 (L) 10/27/2022   MCV 86.7 10/27/2022   PLT 184 10/27/2022   Lab Results  Component Value Date   NA 140 10/27/2022   K 4.5 10/27/2022   CHLORIDE 105 12/07/2016   CO2 29 10/27/2022   GLUCOSE 97 10/27/2022   BUN 38 (H) 10/27/2022   CREATININE 2.72 (H) 10/27/2022   BILITOT 0.7 10/27/2022   ALKPHOS 70 10/27/2022   AST 24 10/27/2022   ALT 20 10/27/2022   PROT 7.8 10/27/2022   ALBUMIN 4.2 10/27/2022   CALCIUM 10.3 10/27/2022   ANIONGAP 8 10/27/2022   EGFR 26 (L) 12/07/2016   GFR 18.93 (L) 04/14/2021   Lab Results  Component Value Date   CHOL 285 (H) 04/14/2021   Lab Results  Component Value Date   HDL 137.60 04/14/2021   Lab Results  Component Value Date   LDLCALC 139 (H) 04/14/2021   Lab Results  Component Value Date   TRIG 44.0 04/14/2021   Lab Results  Component Value Date   CHOLHDL 2 04/14/2021   No results found for: "HGBA1C"     Assessment & Plan:  Preventative health care Assessment & Plan: Patient encouraged to maintain heart healthy diet, regular exercise, adequate sleep. Consider daily probiotics. Take medications as prescribed. Labs ordered and reviewed. Given and reviewed copy of ACP documents from [employer] and encouraged to complete and return.    Hypothyroidism, unspecified type Assessment & Plan: On  Levothyroxine, continue to monitor    Vitamin D deficiency Assessment & Plan: Supplement and monitor    Vitamin B12 deficiency Assessment & Plan: Supplement and monitor    Renal insufficiency Assessment & Plan: Hydrate and monitor is following with nephrology.   Cancer screening  Ductal carcinoma in situ (DCIS) of breast, unspecified laterality Assessment & Plan: Has a mgm on January 31, 2023     Danise Edge, MD

## 2023-01-30 ENCOUNTER — Other Ambulatory Visit: Payer: Self-pay

## 2023-01-30 DIAGNOSIS — Z1211 Encounter for screening for malignant neoplasm of colon: Secondary | ICD-10-CM

## 2023-01-31 ENCOUNTER — Ambulatory Visit
Admission: RE | Admit: 2023-01-31 | Discharge: 2023-01-31 | Disposition: A | Discharge status: Home / Self Care | Payer: Medicare Other | Source: Ambulatory Visit | Attending: Family Medicine | Admitting: Family Medicine

## 2023-01-31 ENCOUNTER — Other Ambulatory Visit: Payer: Self-pay | Admitting: Family Medicine

## 2023-01-31 DIAGNOSIS — R921 Mammographic calcification found on diagnostic imaging of breast: Secondary | ICD-10-CM | POA: Diagnosis not present

## 2023-01-31 DIAGNOSIS — Z853 Personal history of malignant neoplasm of breast: Secondary | ICD-10-CM | POA: Diagnosis not present

## 2023-01-31 HISTORY — DX: Malignant neoplasm of unspecified site of unspecified female breast: C50.919

## 2023-02-01 ENCOUNTER — Encounter: Payer: Self-pay | Admitting: Family Medicine

## 2023-02-05 ENCOUNTER — Encounter: Payer: Self-pay | Admitting: Family Medicine

## 2023-02-07 ENCOUNTER — Ambulatory Visit
Admission: RE | Admit: 2023-02-07 | Discharge: 2023-02-07 | Disposition: A | Discharge status: Home / Self Care | Payer: Medicare Other | Source: Ambulatory Visit | Attending: Family Medicine | Admitting: Family Medicine

## 2023-02-07 DIAGNOSIS — Z853 Personal history of malignant neoplasm of breast: Secondary | ICD-10-CM

## 2023-02-07 DIAGNOSIS — R921 Mammographic calcification found on diagnostic imaging of breast: Secondary | ICD-10-CM

## 2023-02-07 DIAGNOSIS — N6011 Diffuse cystic mastopathy of right breast: Secondary | ICD-10-CM | POA: Diagnosis not present

## 2023-02-07 DIAGNOSIS — N6489 Other specified disorders of breast: Secondary | ICD-10-CM | POA: Diagnosis not present

## 2023-02-07 DIAGNOSIS — D241 Benign neoplasm of right breast: Secondary | ICD-10-CM | POA: Diagnosis not present

## 2023-02-07 HISTORY — PX: BREAST BIOPSY: SHX20

## 2023-02-08 ENCOUNTER — Encounter: Payer: Self-pay | Admitting: Hematology & Oncology

## 2023-02-08 ENCOUNTER — Encounter: Payer: Self-pay | Admitting: *Deleted

## 2023-02-08 NOTE — Progress Notes (Signed)
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 She has been referred to [facility].   Reviewed pathology with Dr Myna Hidalgo. He will follow up with her after surgery, but doesn't feel that he needs to see her with any urgency.   Called and spoke to patient. She had also sent a MyChart message. At this time, she has not yet spoken to a surgeon, but she is not ready to discuss a mastectomy. She would consent to a lumpectomy. She would like to know If Dr Myna Hidalgo has any concerns with that decision. Reviewed this with Dr Myna Hidalgo and he doesn't have any concerns with patient decision for lumpectomy.   Notified patient of Dr Gustavo Lah response. She is grateful for the information.   Oncology Nurse Navigator Documentation     02/08/2023   12:30 PM  Oncology Nurse Navigator Flowsheets  Navigator Location [facility]  Navigator Encounter Type Telephone  Telephone Outgoing Call  Patient Visit Type MedOnc  Treatment Phase Doctor, general practice Needs Education  Education Other  Interventions Education;Psycho-Social Support  Acuity Level 1-No Barriers  Education Method Verbal  Time Spent with Patient 30

## 2023-02-19 ENCOUNTER — Other Ambulatory Visit: Payer: Self-pay

## 2023-02-19 ENCOUNTER — Other Ambulatory Visit (HOSPITAL_BASED_OUTPATIENT_CLINIC_OR_DEPARTMENT_OTHER): Payer: Self-pay

## 2023-02-19 ENCOUNTER — Other Ambulatory Visit: Payer: Self-pay | Admitting: Hematology & Oncology

## 2023-02-19 DIAGNOSIS — Z862 Personal history of diseases of the blood and blood-forming organs and certain disorders involving the immune mechanism: Secondary | ICD-10-CM

## 2023-02-19 MED ORDER — FUSION PLUS PO CAPS
1.0000 | ORAL_CAPSULE | Freq: Every day | ORAL | 3 refills | Status: DC
Start: 2023-02-19 — End: 2023-09-03
  Filled 2023-02-19: qty 30, 30d supply, fill #0
  Filled 2023-02-28: qty 60, 60d supply, fill #0
  Filled 2023-06-12 – 2023-06-15 (×2): qty 60, 60d supply, fill #1

## 2023-02-24 DIAGNOSIS — Z1212 Encounter for screening for malignant neoplasm of rectum: Secondary | ICD-10-CM | POA: Diagnosis not present

## 2023-02-24 DIAGNOSIS — Z1211 Encounter for screening for malignant neoplasm of colon: Secondary | ICD-10-CM | POA: Diagnosis not present

## 2023-02-27 ENCOUNTER — Encounter: Payer: Self-pay | Admitting: Family Medicine

## 2023-02-28 ENCOUNTER — Other Ambulatory Visit (HOSPITAL_BASED_OUTPATIENT_CLINIC_OR_DEPARTMENT_OTHER): Payer: Self-pay

## 2023-03-01 LAB — COLOGUARD: COLOGUARD: NEGATIVE

## 2023-03-05 ENCOUNTER — Other Ambulatory Visit: Payer: Self-pay | Admitting: Surgery

## 2023-03-05 DIAGNOSIS — N6091 Unspecified benign mammary dysplasia of right breast: Secondary | ICD-10-CM

## 2023-03-08 ENCOUNTER — Other Ambulatory Visit: Payer: Self-pay | Admitting: Surgery

## 2023-03-08 DIAGNOSIS — N6091 Unspecified benign mammary dysplasia of right breast: Secondary | ICD-10-CM

## 2023-04-25 ENCOUNTER — Encounter (HOSPITAL_BASED_OUTPATIENT_CLINIC_OR_DEPARTMENT_OTHER): Payer: Self-pay | Admitting: Surgery

## 2023-04-25 ENCOUNTER — Other Ambulatory Visit: Payer: Self-pay

## 2023-05-01 ENCOUNTER — Inpatient Hospital Stay: Payer: Medicare Other

## 2023-05-01 ENCOUNTER — Ambulatory Visit: Payer: Medicare Other | Admitting: Hematology & Oncology

## 2023-05-01 NOTE — Anesthesia Preprocedure Evaluation (Signed)
Anesthesia Evaluation  Patient identified by MRN, date of birth, ID band Patient awake    Reviewed: Allergy & Precautions, NPO status , Patient's Chart, lab work & pertinent test results  History of Anesthesia Complications Negative for: history of anesthetic complications  Airway Mallampati: II  TM Distance: >3 FB Neck ROM: Full    Dental no notable dental hx.    Pulmonary neg pulmonary ROS   Pulmonary exam normal        Cardiovascular hypertension, Pt. on medications Normal cardiovascular exam     Neuro/Psych negative neurological ROS     GI/Hepatic Neg liver ROS,,,S/P gastric sleeve   Endo/Other  Hypothyroidism    Renal/GU Renal InsufficiencyRenal disease     Musculoskeletal negative musculoskeletal ROS (+)    Abdominal   Peds  Hematology negative hematology ROS (+)   Anesthesia Other Findings RIGHT BREASTY ATYPICAL DUCTAL HYPERPLASIA, PAPILLOMA  Reproductive/Obstetrics                              Anesthesia Physical Anesthesia Plan  ASA: 2  Anesthesia Plan: General   Post-op Pain Management: Tylenol PO (pre-op)*   Induction: Intravenous  PONV Risk Score and Plan: 3 and Treatment may vary due to age or medical condition, Ondansetron and Dexamethasone  Airway Management Planned: LMA  Additional Equipment: None  Intra-op Plan:   Post-operative Plan: Extubation in OR  Informed Consent: I have reviewed the patients History and Physical, chart, labs and discussed the procedure including the risks, benefits and alternatives for the proposed anesthesia with the patient or authorized representative who has indicated his/her understanding and acceptance.     Dental advisory given  Plan Discussed with: CRNA  Anesthesia Plan Comments:         Anesthesia Quick Evaluation

## 2023-05-02 ENCOUNTER — Ambulatory Visit
Admission: RE | Admit: 2023-05-02 | Discharge: 2023-05-02 | Disposition: A | Discharge status: Home / Self Care | Payer: Medicare Other | Source: Ambulatory Visit | Attending: Surgery | Admitting: Surgery

## 2023-05-02 ENCOUNTER — Other Ambulatory Visit: Payer: Medicare Other

## 2023-05-02 ENCOUNTER — Ambulatory Visit: Payer: Medicare Other | Admitting: Hematology & Oncology

## 2023-05-02 ENCOUNTER — Encounter (HOSPITAL_BASED_OUTPATIENT_CLINIC_OR_DEPARTMENT_OTHER)
Admission: RE | Admit: 2023-05-02 | Discharge: 2023-05-02 | Disposition: A | Discharge status: Home / Self Care | Payer: Medicare Other | Source: Ambulatory Visit | Attending: Surgery | Admitting: Surgery

## 2023-05-02 DIAGNOSIS — I129 Hypertensive chronic kidney disease with stage 1 through stage 4 chronic kidney disease, or unspecified chronic kidney disease: Secondary | ICD-10-CM | POA: Diagnosis not present

## 2023-05-02 DIAGNOSIS — Z8249 Family history of ischemic heart disease and other diseases of the circulatory system: Secondary | ICD-10-CM | POA: Diagnosis not present

## 2023-05-02 DIAGNOSIS — N6081 Other benign mammary dysplasias of right breast: Secondary | ICD-10-CM | POA: Diagnosis not present

## 2023-05-02 DIAGNOSIS — D241 Benign neoplasm of right breast: Secondary | ICD-10-CM | POA: Diagnosis present

## 2023-05-02 DIAGNOSIS — Z86000 Personal history of in-situ neoplasm of breast: Secondary | ICD-10-CM | POA: Diagnosis not present

## 2023-05-02 DIAGNOSIS — Z01812 Encounter for preprocedural laboratory examination: Secondary | ICD-10-CM | POA: Diagnosis not present

## 2023-05-02 DIAGNOSIS — N184 Chronic kidney disease, stage 4 (severe): Secondary | ICD-10-CM | POA: Insufficient documentation

## 2023-05-02 DIAGNOSIS — N6011 Diffuse cystic mastopathy of right breast: Secondary | ICD-10-CM | POA: Diagnosis not present

## 2023-05-02 DIAGNOSIS — E039 Hypothyroidism, unspecified: Secondary | ICD-10-CM | POA: Diagnosis not present

## 2023-05-02 DIAGNOSIS — N189 Chronic kidney disease, unspecified: Secondary | ICD-10-CM | POA: Diagnosis not present

## 2023-05-02 DIAGNOSIS — Z9884 Bariatric surgery status: Secondary | ICD-10-CM | POA: Diagnosis not present

## 2023-05-02 DIAGNOSIS — N6091 Unspecified benign mammary dysplasia of right breast: Secondary | ICD-10-CM

## 2023-05-02 DIAGNOSIS — Z79899 Other long term (current) drug therapy: Secondary | ICD-10-CM | POA: Diagnosis not present

## 2023-05-02 HISTORY — PX: BREAST BIOPSY: SHX20

## 2023-05-02 LAB — BASIC METABOLIC PANEL
Anion gap: 10 (ref 5–15)
BUN: 42 mg/dL — ABNORMAL HIGH (ref 8–23)
CO2: 24 mmol/L (ref 22–32)
Calcium: 9.3 mg/dL (ref 8.9–10.3)
Chloride: 104 mmol/L (ref 98–111)
Creatinine, Ser: 2.61 mg/dL — ABNORMAL HIGH (ref 0.44–1.00)
GFR, Estimated: 19 mL/min — ABNORMAL LOW (ref >60)
Glucose, Bld: 88 mg/dL (ref 70–99)
Potassium: 4.3 mmol/L (ref 3.5–5.1)
Sodium: 138 mmol/L (ref 135–145)

## 2023-05-02 MED ORDER — ENSURE PRE-SURGERY PO LIQD: 296.0000 mL | Freq: Once | ORAL | Status: DC

## 2023-05-02 MED ORDER — CHLORHEXIDINE GLUCONATE CLOTH 2 % EX PADS: 6.0000 | MEDICATED_PAD | Freq: Once | CUTANEOUS | Status: DC

## 2023-05-02 NOTE — H&P (Signed)
REFERRING PHYSICIAN: Bradd Canary, MD PROVIDER: Wayne Both, MD MRN: [id number] DOB: 1950-09-05  Subjective   Chief Complaint: NEW BREAST  History of Present Illness: Stephanie Pierce is a 72 y.o. female who is seen as an office consultation for evaluation of NEW BREAST problem  This is a very pleasant 72 year old female with a previous history of DCIS in her right breast in 2015 status post lumpectomy, radiation, and antihormonal therapy who then had DCIS in her right breast again in June 2020 which was removed with Dr. Ezzard Standing. On her most recent screening mammography she had several areas biopsied in both the left and right breast. No malignancy was found but in the right breast there was an area of an intraductal papilloma with atypical ductal hyperplasia. She has been doing very well and denies nipple discharge. She handled the biopsy well. She is still currently on antihormonal therapy  Review of Systems: A complete review of systems was obtained from the patient. I have reviewed this information and discussed as appropriate with the patient. See HPI as well for other ROS.  ROS   Medical History: Past Medical History:  Diagnosis Date  Anemia  Chronic kidney disease  History of cancer  Thyroid disease   There is no problem list on file for this patient.  Past Surgical History:  Procedure Laterality Date  CESAREAN SECTION 1985  Breast surgery 1985  Gastric sleeve 2012  CATARACT EXTRACTION Bilateral    Allergies  Allergen Reactions  Iodine Rash  Keflex [Cephalexin] Other (See Comments)  Lisinopril Other (See Comments)  Penicillin Hives   Current Outpatient Medications on File Prior to Visit  Medication Sig Dispense Refill  cholecalciferol (VITAMIN D3) 400 unit tablet Take 800 Units by mouth once daily  cyanocobalamin (VITAMIN B12) 1000 MCG tablet Take 1,000 mcg by mouth once daily  krill oil 500 mg Cap Take by mouth  letrozole (FEMARA) 2.5 mg tablet   losartan (COZAAR) 50 MG tablet  multivitamin tablet Take 1 tablet by mouth once daily  SYNTHROID 100 mcg tablet   No current facility-administered medications on file prior to visit.   Family History  Problem Relation Age of Onset  Coronary Artery Disease (Blocked arteries around heart) Father  Breast cancer Sister    Social History   Tobacco Use  Smoking Status Never  Smokeless Tobacco Never    Social History   Socioeconomic History  Marital status: Married  Tobacco Use  Smoking status: Never  Smokeless tobacco: Never  Substance and Sexual Activity  Alcohol use: Never  Drug use: Never   Social Determinants of Health   Financial Resource Strain: Low Risk (01/22/2023)  Received from [facility]  Overall Financial Resource Strain (CARDIA)  Difficulty of Paying Living Expenses: Not hard at all  Food Insecurity: No Food Insecurity (01/22/2023)  Received from [facility]  Hunger Vital Sign  Worried About Running Out of Food in the Last Year: Never true  Ran Out of Food in the Last Year: Never true  Transportation Needs: No Transportation Needs (01/22/2023)  Received from [facility] - Transportation  Lack of Transportation (Medical): No  Lack of Transportation (Non-Medical): No  Physical Activity: Insufficiently Active (01/22/2023)  [facility] from [facility]  Exercise Vital Sign  Days of Exercise per Week: 2 days  Minutes of Exercise per Session: 20 min  Stress: No Stress Concern Present (01/22/2023)  Received from [facility] of Occupational Health - Occupational Stress Questionnaire  Feeling of  Stress : Not at all  Social Connections: Socially Integrated (01/22/2023)  Received from [facility]  Social Connection and Isolation Panel [NHANES]  Frequency of Communication with Friends and Family: More than three times a week  Frequency of Social Gatherings with Friends and Family: Once a week  Attends Religious Services: 1 to 4 times per  year  Active Member of [employer] or Organizations: Yes  Attends Banker Meetings: Never  Marital Status: Married   Objective:   Vitals:   BP: (!) 144/87  Pulse: 67  Temp: 36.8 C (98.2 F)  SpO2: 99%  Weight: 89.8 kg (198 lb)  Height: 171.5 cm (5' 7.5")  PainSc: 0-No pain  PainLoc: Breast   Body mass index is 30.55 kg/m.  Physical Exam   She appears well on exam  Her multiple scars on her right breast from her previous surgeries. There are no palpable masses in either breast and the nipple areolar complexes are normal.  There is no axillary adenopathy  Labs, Imaging and Diagnostic Testing: I have reviewed her mammograms, ultrasound, and pathology results  Assessment and Plan:   Diagnoses and all orders for this visit:  Atypical ductal hyperplasia of right breast  Right breast intraductal papilloma  I gave her a copy of the pathology results we again discussed them in detail. She is well aware of breast surgery given her 2 previous issues with DCIS as well as her previous radioactive seed guided lumpectomy in 2020. We again would recommend a radioactive seed guided right breast lumpectomy to remove the area that contained an intraductal papilloma with ADH. She is in complete agreement with very to remove the area for complete histologic evaluation to rule out an early malignancy. I again discussed the surgical procedure with her in detail including the risks. She understands and wished to proceed with surgery. Also her husband will be having brain surgery in early August, she wants to hold the surgery until later in August or early September which I feel is very reasonable.

## 2023-05-02 NOTE — Progress Notes (Signed)

## 2023-05-03 ENCOUNTER — Other Ambulatory Visit: Payer: Self-pay

## 2023-05-03 ENCOUNTER — Encounter (HOSPITAL_BASED_OUTPATIENT_CLINIC_OR_DEPARTMENT_OTHER): Payer: Self-pay | Admitting: Surgery

## 2023-05-03 ENCOUNTER — Ambulatory Visit
Admission: RE | Admit: 2023-05-03 | Discharge: 2023-05-03 | Disposition: A | Discharge status: Home / Self Care | Payer: Medicare Other | Source: Ambulatory Visit | Attending: Surgery | Admitting: Surgery

## 2023-05-03 ENCOUNTER — Ambulatory Visit (HOSPITAL_BASED_OUTPATIENT_CLINIC_OR_DEPARTMENT_OTHER): Payer: Medicare Other | Admitting: Anesthesiology

## 2023-05-03 ENCOUNTER — Other Ambulatory Visit (HOSPITAL_BASED_OUTPATIENT_CLINIC_OR_DEPARTMENT_OTHER): Payer: Self-pay

## 2023-05-03 ENCOUNTER — Ambulatory Visit (HOSPITAL_BASED_OUTPATIENT_CLINIC_OR_DEPARTMENT_OTHER)
Admission: RE | Admit: 2023-05-03 | Discharge: 2023-05-03 | Disposition: A | Discharge status: Home / Self Care | Payer: Medicare Other | Attending: Surgery | Admitting: Surgery

## 2023-05-03 ENCOUNTER — Encounter (HOSPITAL_BASED_OUTPATIENT_CLINIC_OR_DEPARTMENT_OTHER)
Admission: RE | Disposition: A | Discharge status: Home / Self Care | Payer: Self-pay | Source: Home / Self Care | Attending: Surgery

## 2023-05-03 DIAGNOSIS — I129 Hypertensive chronic kidney disease with stage 1 through stage 4 chronic kidney disease, or unspecified chronic kidney disease: Secondary | ICD-10-CM | POA: Insufficient documentation

## 2023-05-03 DIAGNOSIS — K8020 Calculus of gallbladder without cholecystitis without obstruction: Secondary | ICD-10-CM

## 2023-05-03 DIAGNOSIS — Z9884 Bariatric surgery status: Secondary | ICD-10-CM | POA: Diagnosis not present

## 2023-05-03 DIAGNOSIS — Z86000 Personal history of in-situ neoplasm of breast: Secondary | ICD-10-CM | POA: Insufficient documentation

## 2023-05-03 DIAGNOSIS — N189 Chronic kidney disease, unspecified: Secondary | ICD-10-CM | POA: Insufficient documentation

## 2023-05-03 DIAGNOSIS — N6081 Other benign mammary dysplasias of right breast: Secondary | ICD-10-CM | POA: Diagnosis not present

## 2023-05-03 DIAGNOSIS — Z79899 Other long term (current) drug therapy: Secondary | ICD-10-CM | POA: Insufficient documentation

## 2023-05-03 DIAGNOSIS — N6011 Diffuse cystic mastopathy of right breast: Secondary | ICD-10-CM | POA: Diagnosis not present

## 2023-05-03 DIAGNOSIS — N6091 Unspecified benign mammary dysplasia of right breast: Secondary | ICD-10-CM

## 2023-05-03 DIAGNOSIS — Z8249 Family history of ischemic heart disease and other diseases of the circulatory system: Secondary | ICD-10-CM | POA: Insufficient documentation

## 2023-05-03 DIAGNOSIS — E039 Hypothyroidism, unspecified: Secondary | ICD-10-CM | POA: Insufficient documentation

## 2023-05-03 DIAGNOSIS — N184 Chronic kidney disease, stage 4 (severe): Secondary | ICD-10-CM

## 2023-05-03 HISTORY — PX: BREAST LUMPECTOMY WITH RADIOACTIVE SEED LOCALIZATION: SHX6424

## 2023-05-03 HISTORY — DX: Chronic kidney disease, stage 4 (severe): N18.4

## 2023-05-03 SURGERY — BREAST LUMPECTOMY WITH RADIOACTIVE SEED LOCALIZATION
Anesthesia: General | Site: Breast | Laterality: Right

## 2023-05-03 MED ORDER — BUPIVACAINE-EPINEPHRINE (PF) 0.5% -1:200000 IJ SOLN
INTRAMUSCULAR | Status: AC
  Filled 2023-05-03: qty 180

## 2023-05-03 MED ORDER — ACETAMINOPHEN 500 MG PO TABS: 1000.0000 mg | ORAL_TABLET | ORAL | Status: DC

## 2023-05-03 MED ORDER — ACETAMINOPHEN 500 MG PO TABS
ORAL_TABLET | ORAL | Status: AC
  Filled 2023-05-03: qty 2

## 2023-05-03 MED ORDER — TRAMADOL HCL 50 MG PO TABS
50.0000 mg | ORAL_TABLET | Freq: Four times a day (QID) | ORAL | 0 refills | Status: DC | PRN
Start: 2023-05-03 — End: 2023-06-06
  Filled 2023-05-03: qty 20, 5d supply, fill #0

## 2023-05-03 MED ORDER — OXYCODONE HCL 5 MG/5ML PO SOLN: 5.0000 mg | Freq: Once | ORAL | Status: DC | PRN

## 2023-05-03 MED ORDER — GLYCOPYRROLATE 0.2 MG/ML IJ SOLN
INTRAMUSCULAR | Status: DC | PRN
Start: 2023-05-03 — End: 2023-05-03
  Administered 2023-05-03: .1 mg via INTRAVENOUS

## 2023-05-03 MED ORDER — LIDOCAINE 2% (20 MG/ML) 5 ML SYRINGE
INTRAMUSCULAR | Status: AC
  Filled 2023-05-03: qty 20

## 2023-05-03 MED ORDER — FENTANYL CITRATE (PF) 100 MCG/2ML IJ SOLN: 25.0000 ug | INTRAMUSCULAR | Status: DC | PRN

## 2023-05-03 MED ORDER — GLYCOPYRROLATE PF 0.2 MG/ML IJ SOSY
PREFILLED_SYRINGE | INTRAMUSCULAR | Status: AC
  Filled 2023-05-03: qty 1

## 2023-05-03 MED ORDER — BUPIVACAINE-EPINEPHRINE 0.5% -1:200000 IJ SOLN
INTRAMUSCULAR | Status: DC | PRN
  Administered 2023-05-03: 20 mL

## 2023-05-03 MED ORDER — ACETAMINOPHEN 500 MG PO TABS
1000.0000 mg | ORAL_TABLET | Freq: Once | ORAL | Status: AC
  Administered 2023-05-03: 1000 mg via ORAL

## 2023-05-03 MED ORDER — CIPROFLOXACIN IN D5W 400 MG/200ML IV SOLN
INTRAVENOUS | Status: AC
  Filled 2023-05-03: qty 200

## 2023-05-03 MED ORDER — LIDOCAINE HCL (CARDIAC) PF 100 MG/5ML IV SOSY
PREFILLED_SYRINGE | INTRAVENOUS | Status: DC | PRN
  Administered 2023-05-03: 100 mg via INTRAVENOUS

## 2023-05-03 MED ORDER — FENTANYL CITRATE (PF) 100 MCG/2ML IJ SOLN
INTRAMUSCULAR | Status: AC
  Filled 2023-05-03: qty 2

## 2023-05-03 MED ORDER — DEXAMETHASONE SODIUM PHOSPHATE 10 MG/ML IJ SOLN
INTRAMUSCULAR | Status: AC
  Filled 2023-05-03: qty 4

## 2023-05-03 MED ORDER — ONDANSETRON HCL 4 MG/2ML IJ SOLN
INTRAMUSCULAR | Status: AC
  Filled 2023-05-03: qty 10

## 2023-05-03 MED ORDER — CIPROFLOXACIN IN D5W 400 MG/200ML IV SOLN
400.0000 mg | INTRAVENOUS | Status: AC
  Administered 2023-05-03: 400 mg via INTRAVENOUS

## 2023-05-03 MED ORDER — FENTANYL CITRATE (PF) 100 MCG/2ML IJ SOLN
INTRAMUSCULAR | Status: DC | PRN
  Administered 2023-05-03: 25 ug via INTRAVENOUS
  Administered 2023-05-03: 50 ug via INTRAVENOUS

## 2023-05-03 MED ORDER — PROPOFOL 10 MG/ML IV BOLUS
INTRAVENOUS | Status: DC | PRN
Start: 2023-05-03 — End: 2023-05-03
  Administered 2023-05-03: 180 mg via INTRAVENOUS

## 2023-05-03 MED ORDER — DROPERIDOL 2.5 MG/ML IJ SOLN: 0.6250 mg | Freq: Once | INTRAMUSCULAR | Status: DC | PRN

## 2023-05-03 MED ORDER — DEXAMETHASONE SODIUM PHOSPHATE 4 MG/ML IJ SOLN
INTRAMUSCULAR | Status: DC | PRN
  Administered 2023-05-03: 4 mg via INTRAVENOUS

## 2023-05-03 MED ORDER — LACTATED RINGERS IV SOLN: INTRAVENOUS | Status: DC

## 2023-05-03 MED ORDER — ONDANSETRON HCL 4 MG/2ML IJ SOLN
INTRAMUSCULAR | Status: DC | PRN
  Administered 2023-05-03: 4 mg via INTRAVENOUS

## 2023-05-03 MED ORDER — EPHEDRINE SULFATE (PRESSORS) 50 MG/ML IJ SOLN
INTRAMUSCULAR | Status: DC | PRN
  Administered 2023-05-03 (×5): 5 mg via INTRAVENOUS

## 2023-05-03 MED ORDER — OXYCODONE HCL 5 MG PO TABS: 5.0000 mg | ORAL_TABLET | Freq: Once | ORAL | Status: DC | PRN

## 2023-05-03 SURGICAL SUPPLY — 49 items
ADH SKN CLS APL DERMABOND .7 (GAUZE/BANDAGES/DRESSINGS) ×1
APL PRP STRL LF DISP 70% ISPRP (MISCELLANEOUS) ×1
APPLIER CLIP 9.375 MED OPEN (MISCELLANEOUS)
APR CLP MED 9.3 20 MLT OPN (MISCELLANEOUS)
BINDER BREAST 3XL (GAUZE/BANDAGES/DRESSINGS) IMPLANT
BINDER BREAST LRG (GAUZE/BANDAGES/DRESSINGS) IMPLANT
BINDER BREAST MEDIUM (GAUZE/BANDAGES/DRESSINGS) IMPLANT
BINDER BREAST XLRG (GAUZE/BANDAGES/DRESSINGS) IMPLANT
BINDER BREAST XXLRG (GAUZE/BANDAGES/DRESSINGS) IMPLANT
BLADE SURG 15 STRL LF DISP TIS (BLADE) ×1 IMPLANT
BLADE SURG 15 STRL SS (BLADE) ×1
CANISTER SUC SOCK COL 7IN (MISCELLANEOUS) IMPLANT
CANISTER SUCT 1200ML W/VALVE (MISCELLANEOUS) IMPLANT
CHLORAPREP W/TINT 26 (MISCELLANEOUS) ×1 IMPLANT
CLIP APPLIE 9.375 MED OPEN (MISCELLANEOUS) IMPLANT
COVER BACK TABLE 60X90IN (DRAPES) ×1 IMPLANT
COVER MAYO STAND STRL (DRAPES) ×1 IMPLANT
COVER PROBE CYLINDRICAL 5X96 (MISCELLANEOUS) ×1 IMPLANT
DERMABOND ADVANCED .7 DNX12 (GAUZE/BANDAGES/DRESSINGS) ×1 IMPLANT
DRAPE LAPAROSCOPIC ABDOMINAL (DRAPES) ×1 IMPLANT
DRAPE UTILITY XL STRL (DRAPES) ×1 IMPLANT
ELECT REM PT RETURN 9FT ADLT (ELECTROSURGICAL) ×1
ELECTRODE REM PT RTRN 9FT ADLT (ELECTROSURGICAL) ×1 IMPLANT
GAUZE SPONGE 4X4 12PLY STRL LF (GAUZE/BANDAGES/DRESSINGS) IMPLANT
GLOVE BIOGEL PI IND STRL 6.5 (GLOVE) IMPLANT
GLOVE SURG SIGNA 7.5 PF LTX (GLOVE) ×1 IMPLANT
GLOVE SURG SS PI 6.5 STRL IVOR (GLOVE) IMPLANT
GOWN STRL REUS W/ TWL LRG LVL3 (GOWN DISPOSABLE) ×1 IMPLANT
GOWN STRL REUS W/ TWL XL LVL3 (GOWN DISPOSABLE) ×1 IMPLANT
GOWN STRL REUS W/TWL LRG LVL3 (GOWN DISPOSABLE) ×1
GOWN STRL REUS W/TWL XL LVL3 (GOWN DISPOSABLE) ×1
KIT MARKER MARGIN INK (KITS) ×1 IMPLANT
NDL HYPO 25X1 1.5 SAFETY (NEEDLE) ×1 IMPLANT
NEEDLE HYPO 25X1 1.5 SAFETY (NEEDLE) ×1 IMPLANT
NS IRRIG 1000ML POUR BTL (IV SOLUTION) IMPLANT
PACK BASIN DAY SURGERY FS (CUSTOM PROCEDURE TRAY) ×1 IMPLANT
PENCIL SMOKE EVACUATOR (MISCELLANEOUS) ×1 IMPLANT
SLEEVE SCD COMPRESS KNEE MED (STOCKING) ×1 IMPLANT
SPIKE FLUID TRANSFER (MISCELLANEOUS) IMPLANT
SPONGE T-LAP 4X18 ~~LOC~~+RFID (SPONGE) ×1 IMPLANT
SUT MNCRL AB 4-0 PS2 18 (SUTURE) ×1 IMPLANT
SUT SILK 2 0 SH (SUTURE) IMPLANT
SUT VIC AB 3-0 SH 27 (SUTURE) ×1
SUT VIC AB 3-0 SH 27X BRD (SUTURE) ×1 IMPLANT
SYR CONTROL 10ML LL (SYRINGE) ×1 IMPLANT
TOWEL GREEN STERILE FF (TOWEL DISPOSABLE) ×1 IMPLANT
TRAY FAXITRON CT DISP (TRAY / TRAY PROCEDURE) ×1 IMPLANT
TUBE CONNECTING 20X1/4 (TUBING) IMPLANT
YANKAUER SUCT BULB TIP NO VENT (SUCTIONS) IMPLANT

## 2023-05-03 NOTE — Anesthesia Procedure Notes (Signed)
Procedure Name: LMA Insertion Date/Time: 05/03/2023 7:35 AM  Performed by: Earmon Phoenix, CRNAPre-anesthesia Checklist: Patient identified, Emergency Drugs available, Suction available, Patient being monitored and Timeout performed Patient Re-evaluated:Patient Re-evaluated prior to induction Oxygen Delivery Method: Circle system utilized Preoxygenation: Pre-oxygenation with 100% oxygen Induction Type: IV induction LMA: LMA inserted LMA Size: 4.0 Number of attempts: 1 Placement Confirmation: breath sounds checked- equal and bilateral and positive ETCO2 Tube secured with: Tape Dental Injury: Teeth and Oropharynx as per pre-operative assessment

## 2023-05-03 NOTE — Interval H&P Note (Signed)
History and Physical Interval Note:no change in H and P  05/03/2023 7:09 AM  [name]  has presented today for surgery, with the diagnosis of RIGHT BREASTY ATYPICAL DUCTAL HYPERPLASIA, PAPILLOMA.  The various methods of treatment have been discussed with the patient and family. After consideration of risks, benefits and other options for treatment, the patient has consented to  Procedure(s) with comments: RIGHT BREAST LUMPECTOMY WITH RADIOACTIVE SEED LOCALIZATION (Right) - LMA as a surgical intervention.  The patient's history has been reviewed, patient examined, no change in status, stable for surgery.  I have reviewed the patient's chart and labs.  Questions were answered to the patient's satisfaction.     Stephanie Pierce

## 2023-05-03 NOTE — Transfer of Care (Signed)
Immediate Anesthesia Transfer of Care Note  Patient: Stephanie Pierce  Procedure(s) Performed: RIGHT BREAST LUMPECTOMY WITH RADIOACTIVE SEED LOCALIZATION (Right: Breast)  Patient Location: PACU  Anesthesia Type:General  Level of Consciousness: awake, alert , oriented, and patient cooperative  Airway & Oxygen Therapy: Patient Spontanous Breathing  Post-op Assessment: Report given to RN and Post -op Vital signs reviewed and stable  Post vital signs: Reviewed and stable  Last Vitals:  Vitals Value Taken Time  BP 117/78 05/03/23 0815  Temp 36.1 C 05/03/23 0813  Pulse 69 05/03/23 0817  Resp 12 05/03/23 0817  SpO2 100 % 05/03/23 0817  Vitals shown include unfiled device data.  Last Pain:  Vitals:   05/03/23 0813  TempSrc:   PainSc: 0-No pain      Patients Stated Pain Goal: 3 (05/03/23 4098)  Complications: No notable events documented.

## 2023-05-03 NOTE — Discharge Instructions (Addendum)
[facility] Office Phone Number [phone]  BREAST BIOPSY/ PARTIAL MASTECTOMY: POST OP INSTRUCTIONS  Always review your discharge instruction sheet given to you by the facility where your surgery was performed.  IF YOU HAVE DISABILITY OR FAMILY LEAVE FORMS, YOU MUST BRING THEM TO THE OFFICE FOR PROCESSING.  DO NOT GIVE THEM TO YOUR DOCTOR.  A prescription for pain medication may be given to you upon discharge.  Take your pain medication as prescribed, if needed.  If narcotic pain medicine is not needed, then you may take acetaminophen (Tylenol) or ibuprofen (Advil) as needed. Take your usually prescribed medications unless otherwise directed If you need a refill on your pain medication, please contact your pharmacy.  They will contact our office to request authorization.  Prescriptions will not be filled after 5pm or on week-ends. You should eat very light the first 24 hours after surgery, such as soup, crackers, pudding, etc.  Resume your normal diet the day after surgery. Most patients will experience some swelling and bruising in the breast.  Ice packs and a good support bra will help.  Swelling and bruising can take several days to resolve.  It is common to experience some constipation if taking pain medication after surgery.  Increasing fluid intake and taking a stool softener will usually help or prevent this problem from occurring.  A mild laxative (Milk of Magnesia or Miralax) should be taken according to package directions if there are no bowel movements after 48 hours. Unless discharge instructions indicate otherwise, you may remove your bandages 24-48 hours after surgery, and you may shower at that time.  You may have steri-strips (small skin tapes) in place directly over the incision.  These strips should be left on the skin for 7-10 days.  If your surgeon used skin glue on the incision, you may shower in 24 hours.  The glue will flake off over the next 2-3 weeks.  Any  sutures or staples will be removed at the office during your follow-up visit. ACTIVITIES:  You may resume regular daily activities (gradually increasing) beginning the next day.  Wearing a good support bra or sports bra minimizes pain and swelling.  You may have sexual intercourse when it is comfortable. You may drive when you no longer are taking prescription pain medication, you can comfortably wear a seatbelt, and you can safely maneuver your car and apply brakes. RETURN TO WORK:  ______________________________________________________________________________________ Stephanie Pierce should see your doctor in the office for a follow-up appointment approximately two weeks after your surgery.  Your doctor's nurse will typically make your follow-up appointment when she calls you with your pathology report.  Expect your pathology report 2-3 business days after your surgery.  You may call to check if you do not hear from [place] after three days. OTHER INSTRUCTIONS: _YOU MAY REMOVE THE BINDER AND SHOWER STARTING TOMORROW AND THEN WEAR WHAT MAKES YOU THE MOST COMFORTABLE ICE PACK, TYLENOL, AND IBUPROFEN ALSO FOR PAIN NO VIGOROUS ACTIVITY FOR ONE WEEK ______________________________________________________________________________________________ _____________________________________________________________________________________________________________________________________ _____________________________________________________________________________________________________________________________________ _____________________________________________________________________________________________________________________________________  WHEN TO CALL YOUR DOCTOR: Fever over 101.0 Nausea and/or vomiting. Extreme swelling or bruising. Continued bleeding from incision. Increased pain, redness, or drainage from the incision.  The clinic staff is available to answer your questions during regular business hours.  Please  don't hesitate to call and ask to speak to one of the nurses for clinical concerns.  If you have a medical emergency, go to the nearest emergency room or call 911.  A surgeon from [facility]  is always on call at the hospital.  For further questions, please visit [email or web address]   No Tylenol until after 12:30pm today if needed  Post Anesthesia Home Care Instructions  Activity: Get plenty of rest for the remainder of the day. A responsible individual must stay with you for 24 hours following the procedure.  For the next 24 hours, DO NOT: -Drive a car -Advertising copywriter -Drink alcoholic beverages -Take any medication unless instructed by your physician -Make any legal decisions or sign important papers.  Meals: Start with liquid foods such as gelatin or soup. Progress to regular foods as tolerated. Avoid greasy, spicy, heavy foods. If nausea and/or vomiting occur, drink only clear liquids until the nausea and/or vomiting subsides. Call your physician if vomiting continues.  Special Instructions/Symptoms: Your throat may feel dry or sore from the anesthesia or the breathing tube placed in your throat during surgery. If this causes discomfort, gargle with warm salt water. The discomfort should disappear within 24 hours.  If you had a scopolamine patch placed behind your ear for the management of post- operative nausea and/or vomiting:  1. The medication in the patch is effective for 72 hours, after which it should be removed.  Wrap patch in a tissue and discard in the trash. Wash hands thoroughly with soap and water. 2. You may remove the patch earlier than 72 hours if you experience unpleasant side effects which may include dry mouth, dizziness or visual disturbances. 3. Avoid touching the patch. Wash your hands with soap and water after contact with the patch.

## 2023-05-03 NOTE — Anesthesia Postprocedure Evaluation (Signed)
Anesthesia Post Note  Patient: Stephanie Pierce  Procedure(s) Performed: RIGHT BREAST LUMPECTOMY WITH RADIOACTIVE SEED LOCALIZATION (Right: Breast)     Patient location during evaluation: PACU Anesthesia Type: General Level of consciousness: awake and alert Pain management: pain level controlled Vital Signs Assessment: post-procedure vital signs reviewed and stable Respiratory status: spontaneous breathing, nonlabored ventilation and respiratory function stable Cardiovascular status: blood pressure returned to baseline Postop Assessment: no apparent nausea or vomiting Anesthetic complications: no   No notable events documented.  Last Vitals:  Vitals:   05/03/23 0813 05/03/23 0815  BP: 122/74 117/78  Pulse: 78 76  Resp: 13 12  Temp: (!) 36.1 C   SpO2: 98% 98%    Last Pain:  Vitals:   05/03/23 0813  TempSrc:   PainSc: 0-No pain                 Shanda Howells

## 2023-05-03 NOTE — Op Note (Signed)
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  She recently had a right breast biopsy showing atypical ductal hyperplasia and a papilloma.  Given her history, decision was made to proceed with a radioactive seed guided lumpectomy.  Procedure: The patient was brought to the operating room identified as the correct patient.  She was placed upon on the operating table and general anesthesia was induced.  Her right breast was prepped and draped in the usual sterile fashion.  Using the neoprobe I located the radioactive seed at the 9 o'clock position of the right breast.  I anesthetized skin with Marcaine and made incision with a scalpel.  With the aid of neoprobe I then dissected down into the breast tissue with the cautery.  I dissected circumferentially around the signal from the radioactive seed using the electrocautery going down to near the chest wall.  I then dissected superiorly and inferiorly and then finally laterally and posteriorly to remove the lumpectomy specimen.  I marked all margins with paint.  An x-ray was performed on the specimen confirming that the radioactive seed within the center of the specimen.  The original biopsy clip had migrated 2.6 cm away from the area and was not localized.  The lumpectomy specimen was sent to pathology for evaluation.  I achieved hemostasis with the cautery.  I anesthetized the wound further with Marcaine.  I  then closed the subcutaneous tissue with interrupted 3-0 Vicryl sutures and closed the skin with a running 4-0 Monocryl.  Dermabond was then applied.  The patient was Nexplanon breast binder.  She tolerated the procedure well.  All the counts were correct at the end of the procedure.  She was then extubated in the operating room and taken in a stable condition to the recovery room.          Abigail Miyamoto   Date: 05/03/2023  Time: 8:06 AM

## 2023-05-04 ENCOUNTER — Encounter (HOSPITAL_BASED_OUTPATIENT_CLINIC_OR_DEPARTMENT_OTHER): Payer: Self-pay | Admitting: Surgery

## 2023-05-07 LAB — SURGICAL PATHOLOGY

## 2023-05-12 DIAGNOSIS — Z23 Encounter for immunization: Secondary | ICD-10-CM | POA: Diagnosis not present

## 2023-05-15 ENCOUNTER — Other Ambulatory Visit (HOSPITAL_BASED_OUTPATIENT_CLINIC_OR_DEPARTMENT_OTHER): Payer: Self-pay

## 2023-05-30 ENCOUNTER — Other Ambulatory Visit: Payer: Self-pay | Admitting: Hematology & Oncology

## 2023-05-30 DIAGNOSIS — D0510 Intraductal carcinoma in situ of unspecified breast: Secondary | ICD-10-CM

## 2023-06-02 DIAGNOSIS — Z23 Encounter for immunization: Secondary | ICD-10-CM | POA: Diagnosis not present

## 2023-06-06 ENCOUNTER — Encounter: Payer: Self-pay | Admitting: Hematology & Oncology

## 2023-06-06 ENCOUNTER — Inpatient Hospital Stay (HOSPITAL_BASED_OUTPATIENT_CLINIC_OR_DEPARTMENT_OTHER): Payer: Medicare Other | Admitting: Hematology & Oncology

## 2023-06-06 ENCOUNTER — Inpatient Hospital Stay: Payer: Medicare Other | Attending: Hematology & Oncology

## 2023-06-06 VITALS — BP 131/79 | HR 78 | Temp 98.6°F | Resp 20 | Ht 67.5 in | Wt 198.0 lb

## 2023-06-06 DIAGNOSIS — D0510 Intraductal carcinoma in situ of unspecified breast: Secondary | ICD-10-CM

## 2023-06-06 DIAGNOSIS — E538 Deficiency of other specified B group vitamins: Secondary | ICD-10-CM

## 2023-06-06 DIAGNOSIS — D0511 Intraductal carcinoma in situ of right breast: Secondary | ICD-10-CM | POA: Insufficient documentation

## 2023-06-06 DIAGNOSIS — D649 Anemia, unspecified: Secondary | ICD-10-CM | POA: Insufficient documentation

## 2023-06-06 DIAGNOSIS — R918 Other nonspecific abnormal finding of lung field: Secondary | ICD-10-CM | POA: Insufficient documentation

## 2023-06-06 DIAGNOSIS — Z923 Personal history of irradiation: Secondary | ICD-10-CM | POA: Insufficient documentation

## 2023-06-06 DIAGNOSIS — Z79811 Long term (current) use of aromatase inhibitors: Secondary | ICD-10-CM | POA: Insufficient documentation

## 2023-06-06 DIAGNOSIS — N289 Disorder of kidney and ureter, unspecified: Secondary | ICD-10-CM | POA: Insufficient documentation

## 2023-06-06 LAB — CBC WITH DIFFERENTIAL (CANCER CENTER ONLY)
Abs Immature Granulocytes: 0.01 10*3/uL (ref 0.00–0.07)
Basophils Absolute: 0 10*3/uL (ref 0.0–0.1)
Basophils Relative: 1 %
Eosinophils Absolute: 0.2 10*3/uL (ref 0.0–0.5)
Eosinophils Relative: 5 %
HCT: 36 % (ref 36.0–46.0)
Hemoglobin: 11.6 g/dL — ABNORMAL LOW (ref 12.0–15.0)
Immature Granulocytes: 0 %
Lymphocytes Relative: 32 %
Lymphs Abs: 1.6 10*3/uL (ref 0.7–4.0)
MCH: 27.8 pg (ref 26.0–34.0)
MCHC: 32.2 g/dL (ref 30.0–36.0)
MCV: 86.3 fL (ref 80.0–100.0)
Monocytes Absolute: 0.5 10*3/uL (ref 0.1–1.0)
Monocytes Relative: 11 %
Neutro Abs: 2.5 10*3/uL (ref 1.7–7.7)
Neutrophils Relative %: 51 %
Platelet Count: 194 10*3/uL (ref 150–400)
RBC: 4.17 MIL/uL (ref 3.87–5.11)
RDW: 13.2 % (ref 11.5–15.5)
WBC Count: 4.8 10*3/uL (ref 4.0–10.5)
nRBC: 0 % (ref 0.0–0.2)

## 2023-06-06 LAB — CMP (CANCER CENTER ONLY)
ALT: 17 U/L (ref 0–44)
AST: 23 U/L (ref 15–41)
Albumin: 3.8 g/dL (ref 3.5–5.0)
Alkaline Phosphatase: 69 U/L (ref 38–126)
Anion gap: 6 (ref 5–15)
BUN: 39 mg/dL — ABNORMAL HIGH (ref 8–23)
CO2: 29 mmol/L (ref 22–32)
Calcium: 10.2 mg/dL (ref 8.9–10.3)
Chloride: 107 mmol/L (ref 98–111)
Creatinine: 2.37 mg/dL — ABNORMAL HIGH (ref 0.44–1.00)
GFR, Estimated: 21 mL/min — ABNORMAL LOW (ref >60)
Glucose, Bld: 91 mg/dL (ref 70–99)
Potassium: 4.7 mmol/L (ref 3.5–5.1)
Sodium: 142 mmol/L (ref 135–145)
Total Bilirubin: 0.6 mg/dL (ref 0.3–1.2)
Total Protein: 8 g/dL (ref 6.5–8.1)

## 2023-06-06 LAB — IRON AND IRON BINDING CAPACITY (CC-WL,HP ONLY)
Iron: 66 ug/dL (ref 28–170)
Saturation Ratios: 21 % (ref 10.4–31.8)
TIBC: 322 ug/dL (ref 250–450)
UIBC: 256 ug/dL (ref 148–442)

## 2023-06-06 LAB — RETICULOCYTES
Immature Retic Fract: 4.7 % (ref 2.3–15.9)
RBC.: 4.12 MIL/uL (ref 3.87–5.11)
Retic Count, Absolute: 38.3 10*3/uL (ref 19.0–186.0)
Retic Ct Pct: 0.9 % (ref 0.4–3.1)

## 2023-06-06 LAB — FERRITIN: Ferritin: 149 ng/mL (ref 11–307)

## 2023-06-06 NOTE — Progress Notes (Signed)
Hematology and Oncology Follow Up Visit  Stephanie Pierce [id number] Dec 23, 1950 72 y.o. 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 daily   Interim History: Stephanie Pierce is here today for follow-up.  We saw her 6 months ago.  Since her last saw her, she actually had surgery on her right breast.  She had a abnormality found [facility] mammogram.  She had surgery that was done on 05/03/2023.  The pathology report ([id number]) showed a nonmalignant condition.  She had some fibrocystic changes with some columnar cell hyperplasia and apocrine metaplasia.  She is on Femara.  She is doing well on the Femara.  She has had no problems with her kidneys.  She has mild renal insufficiency.  She does see nephrology for this.  She has mild anemia.  Again, the anemia is from her renal insufficiency.  Her last erythropoietin level was 14.3.  We have not had to give her any ESA.  Last iron studies that were done back in March showed a ferritin of 120 with an iron saturation of 21%.  She has had no problems with bowels or bladder.  She has had no rashes.  Has been no bleeding.  She has had no leg swelling.  She has had no problems with COVID.  Overall, I would say performance status is probably ECOG 1.   She is on Femara.  She is doing okay on Femara.   Her last bone density test was back in May 2023.  Everything looks normal without any evidence of osteo pia.  Overall, I would say that her performance status is probably ECOG 1.   Medications:  Allergies as of 06/06/2023       Reactions   Lisinopril Swelling   Swelling in the throat   Iodine Hives   Penicillins Rash        Medication List        Accurate as of June 06, 2023  9:51 AM. If you have any questions, ask your nurse or doctor.          STOP taking these medications    traMADol 50 MG tablet Commonly known  as: ULTRAM Stopped by: Josph Macho       TAKE these medications    B-12 2500 MCG Tabs Take 1 tablet by mouth once a week.   CENTRUM SILVER ADULT 50+ PO Take 1 tablet by mouth every other day.   Cholecalciferol 125 MCG (5000 UT) Tabs Take by mouth daily.   fluticasone 50 MCG/ACT nasal spray Commonly known as: FLONASE Place 2 sprays into both nostrils daily.   Fusion Plus Caps Take 1 capsule by mouth daily.   KRILL OIL OMEGA-3 PO Take by mouth daily.   letrozole 2.5 MG tablet Commonly known as: FEMARA TAKE 1 TABLET DAILY   losartan 50 MG tablet Commonly known as: COZAAR Take 50 mg by mouth 2 (two) times daily.   OVER THE COUNTER MEDICATION Take 1 tablet by mouth daily. CALCIUM 650MG  / VIT D3   Synthroid 100 MCG tablet Generic drug: levothyroxine TAKE 1 TABLET DAILY BEFORE BREAKFAST        Allergies:  Allergies  Allergen Reactions   Lisinopril Swelling    Swelling in the throat   Iodine Hives   Penicillins Rash    Past Medical History, Surgical history, Social history, and Family History were reviewed and updated.  Review of Systems: Review of Systems  Constitutional: Negative.   HENT: Negative.    Eyes: Negative.   Respiratory: Negative.    Cardiovascular: Negative.   Gastrointestinal: Negative.   Genitourinary: Negative.   Musculoskeletal: Negative.   Skin: Negative.   Neurological: Negative.   Endo/Heme/Allergies: Negative.   Psychiatric/Behavioral: Negative.       Physical Exam: Her vital signs are temperature of 98.6.  Pulse 78.  Blood pressure 140/80.  Weight is 198 pounds.   Wt Readings from Last 3 Encounters:  06/06/23 198 lb (89.8 kg)  05/03/23 197 lb 15.6 oz (89.8 kg)  01/29/23 194 lb 6.4 oz (88.2 kg)    Physical Exam Vitals reviewed.  Constitutional:      Comments: Her breast exam shows the right breast with the lumpectomy that is well-healed.  This is about the 10 o'clock position.  No obvious nodularity is noted in  the right breast.  There is no right axillary adenopathy.  Left breast is unremarkable.  There is no left axillary adenopathy.  HENT:     Head: Normocephalic and atraumatic.  Eyes:     Pupils: Pupils are equal, round, and reactive to light.  Cardiovascular:     Rate and Rhythm: Normal rate and regular rhythm.     Heart sounds: Normal heart sounds.  Pulmonary:     Effort: Pulmonary effort is normal.     Breath sounds: Normal breath sounds.  Abdominal:     General: Bowel sounds are normal.     Palpations: Abdomen is soft.  Musculoskeletal:        General: No tenderness or deformity. Normal range of motion.     Cervical back: Normal range of motion.  Lymphadenopathy:     Cervical: No cervical adenopathy.  Skin:    General: Skin is warm and dry.     Findings: No erythema or rash.  Neurological:     Mental Status: She is alert and oriented to person, place, and time.  Psychiatric:        Behavior: Behavior normal.        Thought Content: Thought content normal.        Judgment: Judgment normal.      Lab Results  Component Value Date   WBC 4.8 06/06/2023   HGB 11.6 (L) 06/06/2023   HCT 36.0 06/06/2023   MCV 86.3 06/06/2023   PLT 194 06/06/2023   Lab Results  Component Value Date   FERRITIN 120 10/27/2022   IRON 68 10/27/2022   TIBC 319 10/27/2022   UIBC 251 10/27/2022   IRONPCTSAT 21 10/27/2022   Lab Results  Component Value Date   RETICCTPCT 0.9 06/06/2023   RBC 4.12 06/06/2023   No results found for: "KPAFRELGTCHN", "LAMBDASER", "KAPLAMBRATIO" No results found for: "IGGSERUM", "IGA", "IGMSERUM" No results found for: "TOTALPROTELP", "ALBUMINELP", "A1GS", "A2GS", "BETS", "BETA2SER", "GAMS", "MSPIKE", "SPEI"   Chemistry      Component Value Date/Time   NA 142 06/06/2023 0826   NA 141 05/31/2021 0000   NA 147 (H) 06/08/2017 0748   NA 140 12/07/2016 0836   K 4.7 06/06/2023 0826   K 4.1 06/08/2017 0748   K 4.5 12/07/2016 0836   CL 107 06/06/2023 0826   CL 109  (H) 06/08/2017 0748   CO2 29 06/06/2023 0826   CO2 29 06/08/2017 0748   CO2 28 12/07/2016 0836   BUN 39 (H) 06/06/2023 0826   BUN 20 05/31/2021 0000   BUN 39 (H) 06/08/2017 1191  BUN 34.7 (H) 12/07/2016 0836   CREATININE 2.37 (H) 06/06/2023 0826   CREATININE 2.1 (H) 06/08/2017 0748   CREATININE 2.2 (H) 12/07/2016 0836   GLU 93 05/31/2021 0000      Component Value Date/Time   CALCIUM 10.2 06/06/2023 0826   CALCIUM 9.3 06/08/2017 0748   CALCIUM 9.3 12/07/2016 0836   ALKPHOS 69 06/06/2023 0826   ALKPHOS 69 06/08/2017 0748   ALKPHOS 73 12/07/2016 0836   AST 23 06/06/2023 0826   AST 23 12/07/2016 0836   ALT 17 06/06/2023 0826   ALT 27 06/08/2017 0748   ALT 21 12/07/2016 0836   BILITOT 0.6 06/06/2023 0826   BILITOT 0.55 12/07/2016 0836     Impression and Plan: Ms. Almendinger is a 72 yo African American female with DCIS of the right breast diagnosed in July 2015 with lumpectomy.  She also had radiation therapy after the lumpectomy.  She is doing well on the Femara.  We will keep her on Femara for right now.  From my point of view, everything looks pretty good.  We will have to see what her iron studies look like.  We will have to see what her renal function looks like.  I can still plan to get her back in 6 months.  Josph Macho, MD 10/9/20249:51 AM

## 2023-06-12 ENCOUNTER — Other Ambulatory Visit (HOSPITAL_BASED_OUTPATIENT_CLINIC_OR_DEPARTMENT_OTHER): Payer: Self-pay

## 2023-06-15 ENCOUNTER — Other Ambulatory Visit (HOSPITAL_BASED_OUTPATIENT_CLINIC_OR_DEPARTMENT_OTHER): Payer: Self-pay

## 2023-06-18 ENCOUNTER — Other Ambulatory Visit (HOSPITAL_BASED_OUTPATIENT_CLINIC_OR_DEPARTMENT_OTHER): Payer: Self-pay

## 2023-06-20 DIAGNOSIS — N183 Chronic kidney disease, stage 3 unspecified: Secondary | ICD-10-CM | POA: Diagnosis not present

## 2023-06-20 DIAGNOSIS — I129 Hypertensive chronic kidney disease with stage 1 through stage 4 chronic kidney disease, or unspecified chronic kidney disease: Secondary | ICD-10-CM | POA: Diagnosis not present

## 2023-07-02 DIAGNOSIS — M898X9 Other specified disorders of bone, unspecified site: Secondary | ICD-10-CM | POA: Diagnosis not present

## 2023-07-02 DIAGNOSIS — N184 Chronic kidney disease, stage 4 (severe): Secondary | ICD-10-CM | POA: Diagnosis not present

## 2023-07-02 DIAGNOSIS — N1419 Nephropathy induced by other drugs, medicaments and biological substances: Secondary | ICD-10-CM | POA: Diagnosis not present

## 2023-07-02 DIAGNOSIS — E559 Vitamin D deficiency, unspecified: Secondary | ICD-10-CM | POA: Diagnosis not present

## 2023-07-02 DIAGNOSIS — R801 Persistent proteinuria, unspecified: Secondary | ICD-10-CM | POA: Diagnosis not present

## 2023-07-02 DIAGNOSIS — I129 Hypertensive chronic kidney disease with stage 1 through stage 4 chronic kidney disease, or unspecified chronic kidney disease: Secondary | ICD-10-CM | POA: Diagnosis not present

## 2023-07-02 DIAGNOSIS — T39395A Adverse effect of other nonsteroidal anti-inflammatory drugs [NSAID], initial encounter: Secondary | ICD-10-CM | POA: Diagnosis not present

## 2023-07-11 DIAGNOSIS — Z23 Encounter for immunization: Secondary | ICD-10-CM | POA: Diagnosis not present

## 2023-08-09 ENCOUNTER — Ambulatory Visit: Payer: Medicare Other | Admitting: Family Medicine

## 2023-09-03 ENCOUNTER — Other Ambulatory Visit: Payer: Self-pay | Admitting: Hematology & Oncology

## 2023-09-03 ENCOUNTER — Other Ambulatory Visit (HOSPITAL_BASED_OUTPATIENT_CLINIC_OR_DEPARTMENT_OTHER): Payer: Self-pay

## 2023-09-03 DIAGNOSIS — Z862 Personal history of diseases of the blood and blood-forming organs and certain disorders involving the immune mechanism: Secondary | ICD-10-CM

## 2023-09-03 MED ORDER — FUSION PLUS PO CAPS
1.0000 | ORAL_CAPSULE | Freq: Every day | ORAL | 3 refills | Status: DC
  Filled 2023-09-03: qty 30, 30d supply, fill #0
  Filled 2023-09-05: qty 60, 60d supply, fill #0
  Filled 2023-12-03: qty 60, 60d supply, fill #1

## 2023-09-05 ENCOUNTER — Other Ambulatory Visit (HOSPITAL_BASED_OUTPATIENT_CLINIC_OR_DEPARTMENT_OTHER): Payer: Self-pay

## 2023-10-30 ENCOUNTER — Other Ambulatory Visit: Payer: Self-pay | Admitting: Family Medicine

## 2023-11-29 ENCOUNTER — Encounter: Payer: Self-pay | Admitting: Family Medicine

## 2023-12-03 ENCOUNTER — Other Ambulatory Visit (HOSPITAL_BASED_OUTPATIENT_CLINIC_OR_DEPARTMENT_OTHER): Payer: Self-pay

## 2023-12-05 ENCOUNTER — Inpatient Hospital Stay: Payer: Medicare Other | Attending: Hematology & Oncology

## 2023-12-05 ENCOUNTER — Encounter: Payer: Self-pay | Admitting: Hematology & Oncology

## 2023-12-05 ENCOUNTER — Inpatient Hospital Stay (HOSPITAL_BASED_OUTPATIENT_CLINIC_OR_DEPARTMENT_OTHER): Payer: Medicare Other | Admitting: Hematology & Oncology

## 2023-12-05 VITALS — BP 147/81 | HR 75 | Temp 98.3°F | Resp 18 | Ht 67.0 in | Wt 203.0 lb

## 2023-12-05 DIAGNOSIS — E538 Deficiency of other specified B group vitamins: Secondary | ICD-10-CM | POA: Diagnosis not present

## 2023-12-05 DIAGNOSIS — D649 Anemia, unspecified: Secondary | ICD-10-CM | POA: Insufficient documentation

## 2023-12-05 DIAGNOSIS — Z79811 Long term (current) use of aromatase inhibitors: Secondary | ICD-10-CM | POA: Diagnosis not present

## 2023-12-05 DIAGNOSIS — Z862 Personal history of diseases of the blood and blood-forming organs and certain disorders involving the immune mechanism: Secondary | ICD-10-CM | POA: Diagnosis not present

## 2023-12-05 DIAGNOSIS — C7801 Secondary malignant neoplasm of right lung: Secondary | ICD-10-CM | POA: Insufficient documentation

## 2023-12-05 DIAGNOSIS — D0511 Intraductal carcinoma in situ of right breast: Secondary | ICD-10-CM | POA: Diagnosis not present

## 2023-12-05 DIAGNOSIS — N289 Disorder of kidney and ureter, unspecified: Secondary | ICD-10-CM | POA: Diagnosis not present

## 2023-12-05 DIAGNOSIS — C7802 Secondary malignant neoplasm of left lung: Secondary | ICD-10-CM | POA: Diagnosis not present

## 2023-12-05 LAB — IRON AND IRON BINDING CAPACITY (CC-WL,HP ONLY)
Iron: 60 ug/dL (ref 28–170)
Saturation Ratios: 18 % (ref 10.4–31.8)
TIBC: 336 ug/dL (ref 250–450)
UIBC: 276 ug/dL (ref 148–442)

## 2023-12-05 LAB — CBC WITH DIFFERENTIAL (CANCER CENTER ONLY)
Abs Immature Granulocytes: 0.02 10*3/uL (ref 0.00–0.07)
Basophils Absolute: 0.1 10*3/uL (ref 0.0–0.1)
Basophils Relative: 1 %
Eosinophils Absolute: 0.1 10*3/uL (ref 0.0–0.5)
Eosinophils Relative: 3 %
HCT: 36.5 % (ref 36.0–46.0)
Hemoglobin: 12.1 g/dL (ref 12.0–15.0)
Immature Granulocytes: 0 %
Lymphocytes Relative: 30 %
Lymphs Abs: 1.5 10*3/uL (ref 0.7–4.0)
MCH: 28.7 pg (ref 26.0–34.0)
MCHC: 33.2 g/dL (ref 30.0–36.0)
MCV: 86.5 fL (ref 80.0–100.0)
Monocytes Absolute: 0.6 10*3/uL (ref 0.1–1.0)
Monocytes Relative: 11 %
Neutro Abs: 2.8 10*3/uL (ref 1.7–7.7)
Neutrophils Relative %: 55 %
Platelet Count: 179 10*3/uL (ref 150–400)
RBC: 4.22 MIL/uL (ref 3.87–5.11)
RDW: 13.3 % (ref 11.5–15.5)
WBC Count: 5 10*3/uL (ref 4.0–10.5)
nRBC: 0 % (ref 0.0–0.2)

## 2023-12-05 LAB — CMP (CANCER CENTER ONLY)
ALT: 16 U/L (ref 0–44)
AST: 24 U/L (ref 15–41)
Albumin: 4.2 g/dL (ref 3.5–5.0)
Alkaline Phosphatase: 81 U/L (ref 38–126)
Anion gap: 8 (ref 5–15)
BUN: 41 mg/dL — ABNORMAL HIGH (ref 8–23)
CO2: 28 mmol/L (ref 22–32)
Calcium: 10.2 mg/dL (ref 8.9–10.3)
Chloride: 101 mmol/L (ref 98–111)
Creatinine: 2.66 mg/dL — ABNORMAL HIGH (ref 0.44–1.00)
GFR, Estimated: 19 mL/min — ABNORMAL LOW (ref >60)
Glucose, Bld: 88 mg/dL (ref 70–99)
Potassium: 4.1 mmol/L (ref 3.5–5.1)
Sodium: 137 mmol/L (ref 135–145)
Total Bilirubin: 0.8 mg/dL (ref 0.0–1.2)
Total Protein: 8.2 g/dL — ABNORMAL HIGH (ref 6.5–8.1)

## 2023-12-05 LAB — FERRITIN: Ferritin: 156 ng/mL (ref 11–307)

## 2023-12-05 LAB — LACTATE DEHYDROGENASE: LDH: 163 U/L (ref 98–192)

## 2023-12-05 NOTE — Progress Notes (Signed)
 Hematology and Oncology Follow Up Visit  Stephanie Pierce [id number] 05/08/1951 73 y.o. 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 daily -to complete therapy in 2027 Fusion Plus 1 p.o. daily   Interim History: Stephanie Pierce is here today for follow-up.  We saw her 6 months ago.  She is doing okay.  She is busy helping her husband.  He had a meningioma resected back in July 2024.  He has had a slow recovery.  She has had no problems with fever sweats or chills.  There has been no problems with COVID.  Patient wants to try to lose a little weight.  She will try to exercise a little bit more.  She is on oral iron.  This really has helped her anemia.  She is not anemic today.  She has had no problems with constipation.  She has had no problems with rashes.  There is been no leg swelling.  Overall, I would say that her performance status is probably ECOG 1. .   Medications:  Allergies as of 12/05/2023       Reactions   Lisinopril Swelling   Swelling in the throat   Iodine Hives   Cephalexin Other (See Comments)   Penicillins Rash        Medication List        Accurate as of December 05, 2023  8:55 AM. If you have any questions, ask your nurse or doctor.          B-12 2500 MCG Tabs Take 1 tablet by mouth once a week.   CENTRUM SILVER ADULT 50+ PO Take 1 tablet by mouth every other day.   Cholecalciferol 125 MCG (5000 UT) Tabs Take by mouth daily.   fluticasone 50 MCG/ACT nasal spray Commonly known as: FLONASE Place 2 sprays into both nostrils daily.   Fusion Plus Caps Take 1 capsule by mouth daily.   KRILL OIL OMEGA-3 PO Take by mouth daily.   letrozole 2.5 MG tablet Commonly known as: FEMARA TAKE 1 TABLET DAILY   losartan 50 MG tablet Commonly known as: COZAAR Take 50 mg by mouth 2 (two) times daily.    OVER THE COUNTER MEDICATION Take 1 tablet by mouth daily. CALCIUM 650MG  / VIT D3   Synthroid 100 MCG tablet Generic drug: levothyroxine Take 1 tablet (100 mcg total) by mouth daily before breakfast.        Allergies:  Allergies  Allergen Reactions   Lisinopril Swelling    Swelling in the throat   Iodine Hives   Cephalexin Other (See Comments)   Penicillins Rash    Past Medical History, Surgical history, Social history, and Family History were reviewed and updated.  Review of Systems: Review of Systems  Constitutional: Negative.   HENT: Negative.    Eyes: Negative.   Respiratory: Negative.    Cardiovascular: Negative.   Gastrointestinal: Negative.   Genitourinary: Negative.   Musculoskeletal: Negative.   Skin: Negative.   Neurological: Negative.   Endo/Heme/Allergies: Negative.   Psychiatric/Behavioral: Negative.       Physical Exam: Her vital signs are temperature of 98.3.  Pulse 75.  Blood pressure 147/81.  Weight is 203 pounds.      Wt Readings from Last 3 Encounters:  12/05/23 203 lb (92.1 kg)  06/06/23 198 lb (89.8 kg)  05/03/23 197 lb 15.6 oz (89.8  kg)    Physical Exam Vitals reviewed.  Constitutional:      Comments: Her breast exam shows the right breast with the lumpectomy that is well-healed.  This is about the 10 o'clock position.  No obvious nodularity is noted in the right breast.  There is no right axillary adenopathy.  Left breast is unremarkable.  There is no left axillary adenopathy.  HENT:     Head: Normocephalic and atraumatic.  Eyes:     Pupils: Pupils are equal, round, and reactive to light.  Cardiovascular:     Rate and Rhythm: Normal rate and regular rhythm.     Heart sounds: Normal heart sounds.  Pulmonary:     Effort: Pulmonary effort is normal.     Breath sounds: Normal breath sounds.  Abdominal:     General: Bowel sounds are normal.     Palpations: Abdomen is soft.  Musculoskeletal:        General: No tenderness or  deformity. Normal range of motion.     Cervical back: Normal range of motion.  Lymphadenopathy:     Cervical: No cervical adenopathy.  Skin:    General: Skin is warm and dry.     Findings: No erythema or rash.  Neurological:     Mental Status: She is alert and oriented to person, place, and time.  Psychiatric:        Behavior: Behavior normal.        Thought Content: Thought content normal.        Judgment: Judgment normal.      Lab Results  Component Value Date   WBC 5.0 12/05/2023   HGB 12.1 12/05/2023   HCT 36.5 12/05/2023   MCV 86.5 12/05/2023   PLT 179 12/05/2023   Lab Results  Component Value Date   FERRITIN 149 06/06/2023   IRON 66 06/06/2023   TIBC 322 06/06/2023   UIBC 256 06/06/2023   IRONPCTSAT 21 06/06/2023   Lab Results  Component Value Date   RETICCTPCT 0.9 06/06/2023   RBC 4.22 12/05/2023   No results found for: "KPAFRELGTCHN", "LAMBDASER", "KAPLAMBRATIO" No results found for: "IGGSERUM", "IGA", "IGMSERUM" No results found for: "TOTALPROTELP", "ALBUMINELP", "A1GS", "A2GS", "BETS", "BETA2SER", "GAMS", "MSPIKE", "SPEI"   Chemistry      Component Value Date/Time   NA 142 06/06/2023 0826   NA 141 05/31/2021 0000   NA 147 (H) 06/08/2017 0748   NA 140 12/07/2016 0836   K 4.7 06/06/2023 0826   K 4.1 06/08/2017 0748   K 4.5 12/07/2016 0836   CL 107 06/06/2023 0826   CL 109 (H) 06/08/2017 0748   CO2 29 06/06/2023 0826   CO2 29 06/08/2017 0748   CO2 28 12/07/2016 0836   BUN 39 (H) 06/06/2023 0826   BUN 20 05/31/2021 0000   BUN 39 (H) 06/08/2017 0748   BUN 34.7 (H) 12/07/2016 0836   CREATININE 2.37 (H) 06/06/2023 0826   CREATININE 2.1 (H) 06/08/2017 0748   CREATININE 2.2 (H) 12/07/2016 0836   GLU 93 05/31/2021 0000      Component Value Date/Time   CALCIUM 10.2 06/06/2023 0826   CALCIUM 9.3 06/08/2017 0748   CALCIUM 9.3 12/07/2016 0836   ALKPHOS 69 06/06/2023 0826   ALKPHOS 69 06/08/2017 0748   ALKPHOS 73 12/07/2016 0836   AST 23 06/06/2023  0826   AST 23 12/07/2016 0836   ALT 17 06/06/2023 0826   ALT 27 06/08/2017 0748   ALT 21 12/07/2016 0836   BILITOT 0.6 06/06/2023 1610  BILITOT 0.55 12/07/2016 0836     Impression and Plan: Stephanie Pierce is a 35 yo [place] American female with DCIS of the right breast diagnosed in July 2015 with lumpectomy.  She also had radiation therapy after the lumpectomy.  She is doing well on the Femara.  We will keep her on Femara for right now.  I think that we can probably keep her off Femara for another 2 years.  I am happy that her anemia is improved.  She does have mild renal insufficiency.  We do have to be careful with this.  Again, the oral iron seems to be helping her.  Will plan to get her back in 6 months.  I think she is due for mammogram in June 2025.   Josph Macho, MD 4/9/20258:55 AM

## 2023-12-26 DIAGNOSIS — I129 Hypertensive chronic kidney disease with stage 1 through stage 4 chronic kidney disease, or unspecified chronic kidney disease: Secondary | ICD-10-CM | POA: Diagnosis not present

## 2023-12-26 DIAGNOSIS — R801 Persistent proteinuria, unspecified: Secondary | ICD-10-CM | POA: Diagnosis not present

## 2023-12-26 DIAGNOSIS — N184 Chronic kidney disease, stage 4 (severe): Secondary | ICD-10-CM | POA: Diagnosis not present

## 2024-01-02 DIAGNOSIS — E559 Vitamin D deficiency, unspecified: Secondary | ICD-10-CM | POA: Diagnosis not present

## 2024-01-02 DIAGNOSIS — N184 Chronic kidney disease, stage 4 (severe): Secondary | ICD-10-CM | POA: Diagnosis not present

## 2024-01-02 DIAGNOSIS — I129 Hypertensive chronic kidney disease with stage 1 through stage 4 chronic kidney disease, or unspecified chronic kidney disease: Secondary | ICD-10-CM | POA: Diagnosis not present

## 2024-01-02 DIAGNOSIS — R3129 Other microscopic hematuria: Secondary | ICD-10-CM | POA: Diagnosis not present

## 2024-01-02 DIAGNOSIS — R801 Persistent proteinuria, unspecified: Secondary | ICD-10-CM | POA: Diagnosis not present

## 2024-01-02 DIAGNOSIS — M898X9 Other specified disorders of bone, unspecified site: Secondary | ICD-10-CM | POA: Diagnosis not present

## 2024-02-21 ENCOUNTER — Encounter: Payer: Self-pay | Admitting: Family Medicine

## 2024-02-27 ENCOUNTER — Ambulatory Visit

## 2024-02-27 VITALS — BP 124/74 | Ht 67.0 in | Wt 196.0 lb

## 2024-02-27 DIAGNOSIS — Z2821 Immunization not carried out because of patient refusal: Secondary | ICD-10-CM

## 2024-02-27 DIAGNOSIS — Z0000 Encounter for general adult medical examination without abnormal findings: Secondary | ICD-10-CM | POA: Diagnosis not present

## 2024-02-27 NOTE — Patient Instructions (Signed)
 Ms. Bankhead , Thank you for taking time out of your busy schedule to complete your Annual Wellness Visit with me. I enjoyed our conversation and look forward to speaking with you again next year. I, as well as your care team,  appreciate your ongoing commitment to your health goals. Please review the following plan we discussed and let me know if I can assist you in the future. Your Game plan/ To Do List    Referrals: If you haven't heard from the office you've been referred to, please reach out to them at the phone provided.  none Follow up Visits: Next Medicare AWV with our clinical staff: 03/04/2025   Have you seen your provider in the last 6 months (3 months if uncontrolled diabetes)? Yes Next Office Visit with your provider: 04/02/2024  Clinician Recommendations:  Aim for 30 minutes of exercise or brisk walking, 6-8 glasses of water, and 5 servings of fruits and vegetables each day.       This is a list of the screening recommended for you and due dates:  Health Maintenance  Topic Date Due   Zoster (Shingles) Vaccine (1 of 2) 03/14/1971   Medicare Annual Wellness Visit  03/28/2023   COVID-19 Vaccine (6 - 2024-25 season) 04/29/2023   Flu Shot  03/28/2024   Mammogram  01/30/2025   DTaP/Tdap/Td vaccine (2 - Td or Tdap) 02/02/2026   Cologuard (Stool DNA test)  02/23/2026   Pneumococcal Vaccine for age over 40  Completed   DEXA scan (bone density measurement)  Completed   Hepatitis C Screening  Completed   Hepatitis B Vaccine  Aged Out   HPV Vaccine  Aged Out   Meningitis B Vaccine  Aged Out   Colon Cancer Screening  Discontinued    Advanced directives: (Copy Requested) Please bring a copy of your health care power of attorney and living will to the office to be added to your chart at your convenience. You can mail to [facility] [address] [place], [place] [address] or email to [email or web address] Advance Care Planning is important because it:  [x]   Makes sure you receive the medical care that is consistent with your values, goals, and preferences  [x]  It provides guidance to your family and loved ones and reduces their decisional burden about whether or not they are making the right decisions based on your wishes.  Follow the link provided in your after visit summary or read over the paperwork we have mailed to you to help you started getting your Advance Directives in place. If you need assistance in completing these, please reach out to us  so that we can help you!  See attachments for Preventive Care and Fall Prevention Tips.

## 2024-02-27 NOTE — Progress Notes (Signed)
 Because this visit was a virtual/telehealth visit,  certain criteria was not obtained, such a blood pressure, CBG if applicable, and timed get up and go. Any medications not marked as taking were not mentioned during the medication reconciliation part of the visit. Any vitals not documented were not able to be obtained due to this being a telehealth visit or patient was unable to self-report a recent blood pressure reading due to a lack of equipment at home via telehealth. Vitals that have been documented are verbally provided by the patient.   This visit was performed by a medical professional under my direct supervision. I was immediately available for consultation/collaboration. I have reviewed and agree with the Annual Wellness Visit documentation.  Subjective:   Stephanie Pierce is a 73 y.o. who presents for a Medicare Wellness preventive visit.  As a reminder, Annual Wellness Visits don't include a physical exam, and some assessments may be limited, especially if this visit is performed virtually. We may recommend an in-person follow-up visit with your provider if needed.  Visit Complete: Virtual I connected with  Stephanie Pierce on 02/27/24 by a audio enabled telemedicine application and verified that I am speaking with the correct person using two identifiers.  Patient Location: Home  Provider Location: Home Office  I discussed the limitations of evaluation and management by telemedicine. The patient expressed understanding and agreed to proceed.  Vital Signs: Because this visit was a virtual/telehealth visit, some criteria may be missing or patient reported. Any vitals not documented were not able to be obtained and vitals that have been documented are patient reported.  VideoDeclined- This patient declined Librarian, academic. Therefore the visit was completed with audio only.  Persons Participating in Visit: Patient.  AWV Questionnaire: No:  Patient Medicare AWV questionnaire was not completed prior to this visit.  Cardiac Risk Factors include: advanced age (>61men, >47 women);obesity (BMI >30kg/m2);dyslipidemia;hypertension     Objective:    Today's Vitals   02/27/24 0822  BP: 124/74  Weight: 196 lb (88.9 kg)  Height: 5' 7 (1.702 m)   Body mass index is 30.7 kg/m.     02/27/2024    8:21 AM 12/05/2023    8:30 AM 06/06/2023    9:07 AM 05/03/2023    6:23 AM 04/25/2023    3:24 PM 04/28/2022    8:17 AM 03/27/2022    8:33 AM  Advanced Directives  Does Patient Have a Medical Advance Directive? No No No No No No No  Would patient like information on creating a medical advance directive? No - Patient declined No - Patient declined No - Patient declined No - Patient declined No - Patient declined No - Patient declined     Current Medications (verified) Outpatient Encounter Medications as of 02/27/2024  Medication Sig   Cholecalciferol  125 MCG (5000 UT) TABS Take by mouth daily.   Cyanocobalamin  (B-12) 2500 MCG TABS Take 1 tablet by mouth once a week.   Iron -FA-B Cmp-C-Biot-Probiotic (FUSION PLUS) CAPS Take 1 capsule by mouth daily.   KRILL OIL OMEGA-3 PO Take by mouth daily.    letrozole  (FEMARA ) 2.5 MG tablet TAKE 1 TABLET DAILY   losartan  (COZAAR ) 50 MG tablet Take 50 mg by mouth 2 (two) times daily.   Multiple Vitamins-Minerals (CENTRUM SILVER ADULT 50+ PO) Take 1 tablet by mouth every other day.   OVER THE COUNTER MEDICATION Take 1 tablet by mouth daily. CALCIUM  650MG  / VIT D3   SYNTHROID  100 MCG tablet Take 1  tablet (100 mcg total) by mouth daily before breakfast.   fluticasone  (FLONASE ) 50 MCG/ACT nasal spray Place 2 sprays into both nostrils daily. (Patient not taking: Reported on 02/27/2024)   No facility-administered encounter medications on file as of 02/27/2024.    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   Enlarged thyroid  gland    H/O iron  deficiency anemia 03/23/2015   History of chicken pox 03/23/2015   Hyperlipidemia, mixed 03/23/2015   Hypothyroidism 03/23/2015   Overweight 03/23/2015   Personal history of radiation therapy    Pneumonia 2012   Renal insufficiency 03/23/2015   S/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   Vitamin D  deficiency 05/12/2015   Past Surgical History:  Procedure Laterality Date   BREAST BIOPSY Left 02/07/2023   MM LT BREAST BX W LOC DEV 1ST LESION IMAGE BX SPEC STEREO GUIDE 02/07/2023 [facility]   BREAST BIOPSY Right 02/07/2023   MM RT BREAST BX W LOC DEV EA AD LESION IMG BX SPEC STEREO GUIDE 02/07/2023 [facility]   BREAST BIOPSY Right 02/07/2023   MM RT BREAST BX W LOC DEV 1ST LESION IMAGE BX SPEC STEREO GUIDE 02/07/2023 [facility]   BREAST BIOPSY  05/02/2023   MM RT RADIOACTIVE SEED LOC MAMMO GUIDE 05/02/2023 [facility]   BREAST LUMPECTOMY Right    h/o 2 needle biopsy, in 2015 right Lumpectomy DCIS stage 0   BREAST LUMPECTOMY WITH RADIOACTIVE SEED LOCALIZATION Right 02/07/2019   Procedure: RIGHT BREAST LUMPECTOMY WITH RADIOACTIVE SEED LOCALIZATION;  Surgeon: Ethyl Lenis, MD;  Location: [facility];  Service: General;  Laterality: Right;   BREAST LUMPECTOMY WITH RADIOACTIVE SEED LOCALIZATION Right 05/03/2023   Procedure: RIGHT BREAST LUMPECTOMY WITH RADIOACTIVE SEED LOCALIZATION;  Surgeon: Vernetta Berg, MD;  Location: [facility];  Service: General;  Laterality: Right;  LMA   CESAREAN SECTION  1985   EYE SURGERY     LAPAROSCOPIC GASTRIC SLEEVE RESECTION     Family History  Problem Relation Age of Onset   Hypertension Father    Heart attack  Father    Alcohol abuse Father    Cancer Sister        metastatic breast cancer   Diabetes Sister    Hypertension Sister    Breast cancer Sister    Diabetes Brother    Other Brother        complications of exposure to agent orange   Stroke Brother    Diverticulosis Daughter    Social History   Socioeconomic History   Marital status: Married    Spouse name: Not on file   Number of children: Not on file   Years of education: 18   Highest education level: Doctorate  Occupational History   Occupation: Scientist, physiological  Tobacco Use   Smoking status: Never   Smokeless tobacco: Never  Vaping Use   Vaping status: Never Used  Substance and Sexual Activity   Alcohol use: No    Alcohol/week: 0.0 standard drinks of alcohol   Drug use: No   Sexual activity: Yes  Comment: 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, RN case Manager, no dietary restrictions follows heart healthy diet with 60 to 80 gm of protein  Other Topics Concern   Not on file  Social History Narrative   Not on file   Social Drivers of Health   Financial Resource Strain: Low Risk  (02/27/2024)   Overall Financial Resource Strain (CARDIA)    Difficulty of Paying Living Expenses: Not hard at all  Food Insecurity: No Food Insecurity (02/27/2024)   Hunger Vital Sign    Worried About Running Out of Food in the Last Year: Never true    Ran Out of Food in the Last Year: Never true  Transportation Needs: No Transportation Needs (02/27/2024)   PRAPARE - Administrator, Civil Service (Medical): No    Lack of Transportation (Non-Medical): No  Physical Activity: Insufficiently Active (02/27/2024)   Exercise Vital Sign    Days of Exercise per Week: 2 days    Minutes of Exercise per Session: 20 min  Stress: No Stress Concern Present (02/27/2024)   [employer] of Occupational Health - Occupational Stress Questionnaire    Feeling of Stress: Not at all  Social Connections: Socially Integrated (02/27/2024)   Social Connection and  Isolation Panel    Frequency of Communication with Friends and Family: More than three times a week    Frequency of Social Gatherings with Friends and Family: Once a week    Attends Religious Services: 1 to 4 times per year    Active Member of [employer] or Organizations: Yes    Attends Banker Meetings: 1 to 4 times per year    Marital Status: Married    Tobacco Counseling Counseling given: Not Answered    Clinical Intake:  Pre-visit preparation completed: Yes  Pain : No/denies pain     BMI - recorded: 30.7 Nutritional Status: BMI > 30  Obese Nutritional Risks: None Diabetes: No  No results found for: HGBA1C   How often do you need to have someone help you when you read instructions, pamphlets, or other written materials from your doctor or pharmacy?: 1 - Never What is the last grade level you completed in school?: degree  Interpreter Needed?: No  Information entered by :: [place].,cma   Activities of Daily Living     02/27/2024    8:09 AM 05/03/2023    6:26 AM  In your present state of health, do you have any difficulty performing the following activities:  Hearing? 0 0  Vision? 0 0  Difficulty concentrating or making decisions? 0 0  Walking or climbing stairs? 0 0  Dressing or bathing? 0 0  Doing errands, shopping? 0   Preparing Food and eating ? N   Using the Toilet? N   In the past six months, have you accidently leaked urine? N   Managing your Medications? N   Managing your Finances? N   Housekeeping or managing your Housekeeping? N     Patient Care Team: Domenica Harlene LABOR, MD as PCP - General (Family Medicine) Timmy Maude SAUNDERS, MD as Consulting Physician (Oncology)  I have updated your Care Teams any recent Medical Services you may have received from other providers in the past year.     Assessment:   This is a routine wellness examination for [place].  Hearing/Vision screen Hearing Screening - Comments:: No difficulties Vision  Screening - Comments:: Patient wears readers   Goals Addressed  This Visit's Progress    Maintain healthy active lifestyle.   On track      Depression Screen     02/27/2024    8:26 AM 01/29/2023    9:25 AM 03/27/2022    8:36 AM 04/14/2021   11:10 AM 03/18/2021    9:07 AM 11/21/2019    8:49 AM 10/05/2017    1:03 PM  PHQ 2/9 Scores  PHQ - 2 Score 0 0 0 0 0 0 0  PHQ- 9 Score 0 0         Fall Risk     02/27/2024    8:09 AM 01/29/2023    9:25 AM 03/27/2022    8:34 AM 03/26/2022    5:46 PM 04/14/2021   11:11 AM  Fall Risk   Falls in the past year? 0 0 0 0 0  Number falls in past yr: 0 0 0  0  Injury with Fall? 0 0 0  0  Risk for fall due to : No Fall Risks    No Fall Risks  Follow up Falls evaluation completed Falls evaluation completed Falls prevention discussed   Falls evaluation completed      Data saved with a previous flowsheet row definition    MEDICARE RISK AT HOME:  Medicare Risk at Home Any stairs in or around the home?: (Patient-Rptd) Yes If so, are there any without handrails?: (Patient-Rptd) No Home free of loose throw rugs in walkways, pet beds, electrical cords, etc?: (Patient-Rptd) Yes Adequate lighting in your home to reduce risk of falls?: (Patient-Rptd) Yes Life alert?: (Patient-Rptd) No Use of a cane, walker or w/c?: (Patient-Rptd) No Grab bars in the bathroom?: (Patient-Rptd) No Shower chair or bench in shower?: (Patient-Rptd) No Elevated toilet seat or a handicapped toilet?: (Patient-Rptd) No  TIMED UP AND GO:  Was the test performed?  no  Cognitive Function: 6CIT completed        02/27/2024    8:24 AM  6CIT Screen  What Year? 0 points  What month? 0 points  What time? 0 points  Count back from 20 0 points  Months in reverse 0 points  Repeat phrase 0 points  Total Score 0 points    Immunizations Immunization History  Administered Date(s) Administered   Fluad Quad(high Dose 65+) 06/21/2021   Influenza,inj,Quad PF,6+ Mos 06/08/2016    Influenza-Unspecified 05/28/2014, 05/29/2015, 06/08/2016, 06/15/2017, 05/04/2018, 05/10/2019   PFIZER(Purple Top)SARS-COV-2 Vaccination 10/04/2019, 10/25/2019, 05/08/2020, 11/27/2020   PPD Test 01/28/2020   Pfizer Covid-19 Vaccine Bivalent Booster 27yrs & up 05/31/2021   Pneumococcal Conjugate-13 02/03/2016   Pneumococcal Polysaccharide-23 10/05/2017   Pneumococcal-Unspecified 08/28/2010   Tdap 02/03/2016   Zoster, Live 03/23/2015    Screening Tests Health Maintenance  Topic Date Due   Zoster Vaccines- Shingrix (1 of 2) 03/14/1971   COVID-19 Vaccine (6 - 2024-25 season) 04/29/2023   INFLUENZA VACCINE  03/28/2024   MAMMOGRAM  01/30/2025   Medicare Annual Wellness (AWV)  02/26/2025   DTaP/Tdap/Td (2 - Td or Tdap) 02/02/2026   Fecal DNA (Cologuard)  02/23/2026   Pneumococcal Vaccine: 50+ Years  Completed   DEXA SCAN  Completed   Hepatitis C Screening  Completed   Hepatitis B Vaccines  Aged Out   HPV VACCINES  Aged Out   Meningococcal B Vaccine  Aged Out   Colonoscopy  Discontinued    Health Maintenance  Health Maintenance Due  Topic Date Due   Zoster Vaccines- Shingrix (1 of 2) 03/14/1971   COVID-19 Vaccine (6 - 2024-25  season) 04/29/2023   Health Maintenance Items Addressed:patient declined  Additional Screening:  Vision Screening: Recommended annual ophthalmology exams for early detection of glaucoma and other disorders of the eye. Would you like a referral to an eye doctor? No    Dental Screening: Recommended annual dental exams for proper oral hygiene  Community Resource Referral / Chronic Care Management: CRR required this visit?  No   CCM required this visit?  No   Plan:    I have personally reviewed and noted the following in the patient's chart:   Medical and social history Use of alcohol, tobacco or illicit drugs  Current medications and supplements including opioid prescriptions. Patient is not currently taking opioid prescriptions. Functional  ability and status Nutritional status Physical activity Advanced directives List of other physicians Hospitalizations, surgeries, and ER visits in previous 12 months Vitals Screenings to include cognitive, depression, and falls Referrals and appointments  In addition, I have reviewed and discussed with patient certain preventive protocols, quality metrics, and best practice recommendations. A written personalized care plan for preventive services as well as general preventive health recommendations were provided to patient.   Lyle MARLA Right, [place]   02/27/2024   After Visit Summary: (MyChart) Due to this being a telephonic visit, the after visit summary with patients personalized plan was offered to patient via MyChart   Notes: Nothing significant to report at this time.

## 2024-03-03 ENCOUNTER — Other Ambulatory Visit: Payer: Self-pay | Admitting: Hematology & Oncology

## 2024-03-03 ENCOUNTER — Other Ambulatory Visit (HOSPITAL_BASED_OUTPATIENT_CLINIC_OR_DEPARTMENT_OTHER): Payer: Self-pay

## 2024-03-03 DIAGNOSIS — Z862 Personal history of diseases of the blood and blood-forming organs and certain disorders involving the immune mechanism: Secondary | ICD-10-CM

## 2024-03-03 MED ORDER — FUSION PLUS PO CAPS
1.0000 | ORAL_CAPSULE | Freq: Every day | ORAL | 3 refills | Status: DC
  Filled 2024-03-03: qty 30, 30d supply, fill #0
  Filled 2024-05-21: qty 30, 30d supply, fill #1
  Filled 2024-06-25: qty 60, 60d supply, fill #2

## 2024-03-04 ENCOUNTER — Other Ambulatory Visit (HOSPITAL_BASED_OUTPATIENT_CLINIC_OR_DEPARTMENT_OTHER): Payer: Self-pay

## 2024-03-06 ENCOUNTER — Other Ambulatory Visit (HOSPITAL_BASED_OUTPATIENT_CLINIC_OR_DEPARTMENT_OTHER): Payer: Self-pay

## 2024-03-31 ENCOUNTER — Encounter: Payer: Medicare Other | Admitting: Family Medicine

## 2024-04-01 DIAGNOSIS — I10 Essential (primary) hypertension: Secondary | ICD-10-CM | POA: Insufficient documentation

## 2024-04-01 NOTE — Assessment & Plan Note (Signed)
 Encourage heart healthy diet such as MIND or DASH diet, increase exercise, avoid trans fats, simple carbohydrates and processed foods, consider a krill or fish or flaxseed oil cap daily.

## 2024-04-01 NOTE — Assessment & Plan Note (Signed)
 Supplement and monitor

## 2024-04-01 NOTE — Assessment & Plan Note (Signed)
Hydrate and monitor is following with nephrology 

## 2024-04-01 NOTE — Assessment & Plan Note (Signed)
 Well controlled, no changes to meds. Encouraged heart healthy diet such as the DASH diet and exercise as tolerated.

## 2024-04-01 NOTE — Assessment & Plan Note (Signed)
 Update CBC and monitor.

## 2024-04-01 NOTE — Progress Notes (Unsigned)
 Subjective:     Patient ID: Stephanie Pierce, female    DOB: 28-Mar-1951, 73 y.o.   MRN: [id number]  No chief complaint on file.   HPI  Stephanie Pierce is a 73 year old female presents for follow-up on chronic conditions.  PMHx-CKD stage IV, HLD, hypothyroidism, breast CA,  Followed by: Nephology, Oncology, Gen Sx  Patient Care Team: Domenica Harlene LABOR, MD as PCP - General (Family Medicine) Timmy Maude SAUNDERS, MD as Consulting Physician (Oncology)    HTN-losartan  50 mg daily  Hypothyroidism-100 mcg daily  HLD-Krill oil omega-3 daily  Vitamin D  and vitamin B12 deficiency-taking vitamin D3 5000 units daily, B12 2500 mcg daily  IDA-iron  supplements  Reports taking medications as prescribed.   HCM: Mgm: Hx breast Ca, followed by Onc/Hem- Last Mgm 01/2023; repeat 2025 and continue annually -Pap: Last 03/2021, WNL -DEXA: last 12/2021, WNL, Repeat 2-3 years - Colonoscopy: Cologuard 01/2023, Next due 01/2026 - Immunizations: Shingles, COVID-19, flu due    Patient denies fever, chills, SOB, CP, palpitations, dyspnea, edema, HA, vision changes, N/V/D, abdominal pain, urinary symptoms, rash, weight changes, and recent illness or hospitalizations.      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   right   Enlarged thyroid  gland    H/O iron  deficiency anemia 03/23/2015   History of chicken pox 03/23/2015   Hyperlipidemia, mixed 03/23/2015   Hypothyroidism 03/23/2015   Overweight  03/23/2015   Personal history of radiation therapy    Pneumonia 2012   Renal insufficiency 03/23/2015   S/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   Vitamin D  deficiency 05/12/2015    Past Surgical History:  Procedure Laterality Date   BREAST BIOPSY Left 02/07/2023   MM LT BREAST BX W LOC DEV 1ST LESION IMAGE BX SPEC STEREO GUIDE 02/07/2023 [facility]   BREAST BIOPSY Right 02/07/2023   MM RT BREAST BX W LOC DEV EA AD LESION IMG BX SPEC STEREO GUIDE 02/07/2023 [facility]   BREAST BIOPSY Right 02/07/2023   MM RT BREAST BX W LOC DEV 1ST LESION IMAGE BX SPEC STEREO GUIDE 02/07/2023 [facility]   BREAST BIOPSY  05/02/2023   MM RT RADIOACTIVE SEED LOC MAMMO GUIDE 05/02/2023 [facility]   BREAST LUMPECTOMY Right    h/o 2 needle biopsy, in 2015 right Lumpectomy DCIS stage 0   BREAST LUMPECTOMY WITH RADIOACTIVE SEED LOCALIZATION Right 02/07/2019   Procedure: RIGHT BREAST LUMPECTOMY WITH RADIOACTIVE SEED LOCALIZATION;  Surgeon: Ethyl Lenis, MD;  Location: [facility];  Service: General;  Laterality: Right;   BREAST LUMPECTOMY WITH RADIOACTIVE SEED LOCALIZATION Right 05/03/2023   Procedure: RIGHT BREAST LUMPECTOMY WITH RADIOACTIVE SEED LOCALIZATION;  Surgeon: Vernetta Berg, MD;  Location: [facility];  Service: General;  Laterality: Right;  LMA   CESAREAN SECTION  1985   EYE SURGERY     LAPAROSCOPIC GASTRIC  SLEEVE RESECTION      Family History  Problem Relation Age of Onset   Hypertension Father    Heart attack Father    Alcohol abuse Father    Cancer Sister        metastatic breast cancer   Diabetes Sister    Hypertension Sister    Breast cancer Sister    Diabetes Brother    Other Brother        complications of exposure to agent orange   Stroke Brother    Diverticulosis Daughter     Social History   Socioeconomic History   Marital status: Married    Spouse name: Not on file   Number of children: Not on file   Years  of education: 18   Highest education level: Doctorate  Occupational History   Occupation: Scientist, physiological  Tobacco Use   Smoking status: Never   Smokeless tobacco: Never  Vaping Use   Vaping status: Never Used  Substance and Sexual Activity   Alcohol use: No    Alcohol/week: 0.0 standard drinks of alcohol   Drug use: No   Sexual activity: Yes    Comment: 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, RN case Production designer, theatre/television/film, no dietary restrictions follows heart healthy diet with 60 to 80 gm of protein  Other Topics Concern   Not on file  Social History Narrative   Not on file   Social Drivers of Health   Financial Resource Strain: Low Risk  (02/27/2024)   Overall Financial Resource Strain (CARDIA)    Difficulty of Paying Living Expenses: Not hard at all  Food Insecurity: No Food Insecurity (02/27/2024)   Hunger Vital Sign    Worried About Running Out of Food in the Last Year: Never true    Ran Out of Food in the Last Year: Never true  Transportation Needs: No Transportation Needs (02/27/2024)   PRAPARE - Administrator, Civil Service (Medical): No    Lack of Transportation (Non-Medical): No  Physical Activity: Insufficiently Active (02/27/2024)   Exercise Vital Sign    Days of Exercise per Week: 2 days    Minutes of Exercise per Session: 20 min  Stress: No Stress Concern Present (02/27/2024)   [employer] of Occupational Health - Occupational Stress Questionnaire    Feeling of Stress: Not at all  Social Connections: Socially Integrated (02/27/2024)   Social Connection and Isolation Panel    Frequency of Communication with Friends and Family: More than three times a week    Frequency of Social Gatherings with Friends and Family: Once a week    Attends Religious Services: 1 to 4 times per year    Active Member of [employer] or Organizations: Yes    Attends Banker Meetings: 1 to 4 times per year    Marital Status: Married  Catering manager Violence: Not At Risk (02/27/2024)   Humiliation,  Afraid, Rape, and Kick questionnaire    Fear of Current or Ex-Partner: No    Emotionally Abused: No    Physically Abused: No    Sexually Abused: No    Outpatient Medications Prior to Visit  Medication Sig Dispense Refill   Cholecalciferol  125 MCG (5000 UT) TABS Take by mouth daily.     Cyanocobalamin  (B-12) 2500 MCG TABS Take 1 tablet by mouth once a week. 12 tablet 5   fluticasone  (FLONASE ) 50 MCG/ACT nasal spray Place 2 sprays into both nostrils daily. (Patient not taking: Reported on 02/27/2024) 48 g 1   Iron -FA-B  Cmp-C-Biot-Probiotic (FUSION PLUS) CAPS Take 1 capsule by mouth daily. 30 capsule 3   KRILL OIL OMEGA-3 PO Take by mouth daily.      letrozole  (FEMARA ) 2.5 MG tablet TAKE 1 TABLET DAILY 90 tablet 3   losartan  (COZAAR ) 50 MG tablet Take 50 mg by mouth 2 (two) times daily.     Multiple Vitamins-Minerals (CENTRUM SILVER ADULT 50+ PO) Take 1 tablet by mouth every other day.     OVER THE COUNTER MEDICATION Take 1 tablet by mouth daily. CALCIUM  650MG  / VIT D3     SYNTHROID  100 MCG tablet Take 1 tablet (100 mcg total) by mouth daily before breakfast. 90 tablet 1   No facility-administered medications prior to visit.    Allergies  Allergen Reactions   Lisinopril Swelling    Swelling in the throat   Iodine Hives   Cephalexin Other (See Comments)   Penicillins Rash    ROS    See HPI Objective:    Physical Exam  General: No acute distress. Awake and conversant.  Eyes: Normal conjunctiva, anicteric. Round symmetric pupils.  ENT: Hearing grossly intact. No nasal discharge.  Neck: Neck is supple. No masses or thyromegaly.  Respiratory: CTAB. Respirations are non-labored. No wheezing.  Skin: Warm. No rashes or ulcers.  Psych: Alert and oriented. Cooperative, Appropriate mood and affect, Normal judgment.  CV: RRR. No murmur. No lower extremity edema.  MSK: Normal ambulation. No clubbing or cyanosis.  Neuro:  CN II-XII grossly normal.   There were no vitals taken for this  visit. Wt Readings from Last 3 Encounters:  02/27/24 196 lb (88.9 kg)  12/05/23 203 lb (92.1 kg)  06/06/23 198 lb (89.8 kg)       Assessment & Plan:   Problem List Items Addressed This Visit     DCIS (ductal carcinoma in situ) of breast   H/O iron  deficiency anemia   Update CBC and monitor.      Hyperlipidemia, mixed   Encourage heart healthy diet such as MIND or DASH diet, increase exercise, avoid trans fats, simple carbohydrates and processed foods, consider a krill or fish or flaxseed oil cap daily.        Hypertension   Well controlled, no changes to meds. Encouraged heart healthy diet such as the DASH diet and exercise as tolerated.        Hypothyroidism   On Levothyroxine , continue to monitor       Preventative health care - Primary   Patient encouraged to maintain heart healthy diet, regular exercise, adequate sleep. Consider daily probiotics. Take medications as prescribed. Labs ordered and reviewed. Given and reviewed copy of ACP documents from [employer] and encouraged to complete and return.    HCM: Mgm: Hx breast Ca, followed by Onc/Hem- Last b/l Mgm 01/2023; repeat 2025 and continue annually -Pap: Last 03/2021, WNL -DEXA: last 12/2021, WNL, Repeat 2-3 years - Colonoscopy: Cologuard 01/2023, Next due 01/2026 - Immunizations: Shingles, COVID-19, flu due       Renal insufficiency   Hydrate and monitor is following with nephrology        Vitamin B12 deficiency   Supplement and monitor         Vitamin D  deficiency (Chronic)   Supplement and monitor          Portions of this note were dictated using DRAGON voice recognition software. Please disregard any errors in transcription.    I am having Brody A. Colucci maintain her Multiple Vitamins-Minerals (CENTRUM SILVER  ADULT 50+ PO), KRILL OIL OMEGA-3 PO, B-12, OVER THE COUNTER MEDICATION, fluticasone , losartan , letrozole , Cholecalciferol , Synthroid , and Fusion Plus.  No orders of the defined  types were placed in this encounter.

## 2024-04-01 NOTE — Assessment & Plan Note (Signed)
 On Levothyroxine, continue to monitor

## 2024-04-01 NOTE — Assessment & Plan Note (Addendum)
 Patient encouraged to maintain heart healthy diet, regular exercise, adequate sleep. Consider daily probiotics. Take medications as prescribed. Labs ordered and reviewed. Given and reviewed copy of ACP documents from [employer] and encouraged to complete and return.    HCM: Mgm: Hx breast Ca, followed by Onc/Hem- Last b/l Mgm 01/2023; repeat 2025 and continue annually- Pt to schedule Mgm. -Pap: Last 03/2021, WNL -DEXA: last 12/2021, WNL, Repeat 2-3 years - Colonoscopy: Cologuard 01/2023, Next due 01/2026

## 2024-04-02 ENCOUNTER — Ambulatory Visit (INDEPENDENT_AMBULATORY_CARE_PROVIDER_SITE_OTHER): Admitting: Student

## 2024-04-02 ENCOUNTER — Encounter: Payer: Self-pay | Admitting: Student

## 2024-04-02 VITALS — BP 138/86 | HR 72 | Temp 98.5°F | Resp 12 | Ht 65.0 in | Wt 187.4 lb

## 2024-04-02 DIAGNOSIS — E559 Vitamin D deficiency, unspecified: Secondary | ICD-10-CM

## 2024-04-02 DIAGNOSIS — D0511 Intraductal carcinoma in situ of right breast: Secondary | ICD-10-CM | POA: Diagnosis not present

## 2024-04-02 DIAGNOSIS — E538 Deficiency of other specified B group vitamins: Secondary | ICD-10-CM

## 2024-04-02 DIAGNOSIS — E782 Mixed hyperlipidemia: Secondary | ICD-10-CM | POA: Diagnosis not present

## 2024-04-02 DIAGNOSIS — Z0000 Encounter for general adult medical examination without abnormal findings: Secondary | ICD-10-CM

## 2024-04-02 DIAGNOSIS — I10 Essential (primary) hypertension: Secondary | ICD-10-CM

## 2024-04-02 DIAGNOSIS — Z862 Personal history of diseases of the blood and blood-forming organs and certain disorders involving the immune mechanism: Secondary | ICD-10-CM

## 2024-04-02 DIAGNOSIS — N289 Disorder of kidney and ureter, unspecified: Secondary | ICD-10-CM

## 2024-04-02 DIAGNOSIS — E039 Hypothyroidism, unspecified: Secondary | ICD-10-CM | POA: Diagnosis not present

## 2024-04-02 DIAGNOSIS — Z1231 Encounter for screening mammogram for malignant neoplasm of breast: Secondary | ICD-10-CM

## 2024-04-02 LAB — CBC WITH DIFFERENTIAL/PLATELET
Basophils Absolute: 0.1 10*3/uL (ref 0.0–0.1)
Basophils Relative: 1.4 % (ref 0.0–3.0)
Eosinophils Absolute: 0.2 10*3/uL (ref 0.0–0.7)
Eosinophils Relative: 5.1 % — ABNORMAL HIGH (ref 0.0–5.0)
HCT: 33.6 % — ABNORMAL LOW (ref 36.0–46.0)
Hemoglobin: 11 g/dL — ABNORMAL LOW (ref 12.0–15.0)
Lymphocytes Relative: 30.8 % (ref 12.0–46.0)
Lymphs Abs: 1.1 10*3/uL (ref 0.7–4.0)
MCHC: 32.8 g/dL (ref 30.0–36.0)
MCV: 85.9 fl (ref 78.0–100.0)
Monocytes Absolute: 0.4 10*3/uL (ref 0.1–1.0)
Monocytes Relative: 11.7 % (ref 3.0–12.0)
Neutro Abs: 1.8 10*3/uL (ref 1.4–7.7)
Neutrophils Relative %: 51 % (ref 43.0–77.0)
Platelets: 153 10*3/uL (ref 150.0–400.0)
RBC: 3.91 Mil/uL (ref 3.87–5.11)
RDW: 14.5 % (ref 11.5–15.5)
WBC: 3.6 10*3/uL — ABNORMAL LOW (ref 4.0–10.5)

## 2024-04-02 LAB — COMPREHENSIVE METABOLIC PANEL WITH GFR
ALT: 19 U/L (ref 0–35)
AST: 26 U/L (ref 0–37)
Albumin: 3.9 g/dL (ref 3.5–5.2)
Alkaline Phosphatase: 64 U/L (ref 39–117)
BUN: 37 mg/dL — ABNORMAL HIGH (ref 6–23)
CO2: 28 meq/L (ref 19–32)
Calcium: 9.4 mg/dL (ref 8.4–10.5)
Chloride: 104 meq/L (ref 96–112)
Creatinine, Ser: 2.37 mg/dL — ABNORMAL HIGH (ref 0.40–1.20)
GFR: 20.05 mL/min — ABNORMAL LOW
Glucose, Bld: 89 mg/dL (ref 70–99)
Potassium: 3.8 meq/L (ref 3.5–5.1)
Sodium: 138 meq/L (ref 135–145)
Total Bilirubin: 0.7 mg/dL (ref 0.2–1.2)
Total Protein: 7.3 g/dL (ref 6.0–8.3)

## 2024-04-02 LAB — LIPID PANEL
Cholesterol: 221 mg/dL — ABNORMAL HIGH (ref 0–200)
HDL: 101.2 mg/dL (ref >39.00)
LDL Cholesterol: 110 mg/dL — ABNORMAL HIGH (ref 0–99)
NonHDL: 119.38
Total CHOL/HDL Ratio: 2
Triglycerides: 46 mg/dL (ref 0.0–149.0)
VLDL: 9.2 mg/dL (ref 0.0–40.0)

## 2024-04-02 LAB — TSH: TSH: 0.98 u[IU]/mL (ref 0.35–5.50)

## 2024-04-02 NOTE — Assessment & Plan Note (Signed)
 Follows with Oncology. Scheduling Mgm.

## 2024-04-03 ENCOUNTER — Ambulatory Visit: Payer: Self-pay | Admitting: Student

## 2024-04-03 ENCOUNTER — Other Ambulatory Visit (HOSPITAL_BASED_OUTPATIENT_CLINIC_OR_DEPARTMENT_OTHER): Payer: Self-pay

## 2024-04-03 DIAGNOSIS — D649 Anemia, unspecified: Secondary | ICD-10-CM | POA: Insufficient documentation

## 2024-04-03 DIAGNOSIS — N289 Disorder of kidney and ureter, unspecified: Secondary | ICD-10-CM

## 2024-04-03 MED ORDER — AMLODIPINE BESYLATE 2.5 MG PO TABS
2.5000 mg | ORAL_TABLET | Freq: Every day | ORAL | 0 refills | Status: DC
  Filled 2024-04-03: qty 90, 90d supply, fill #0

## 2024-04-03 NOTE — Addendum Note (Signed)
 Addended by: WHEELER HARLENE CROME on: 04/03/2024 09:39 AM   Modules accepted: Orders

## 2024-04-10 ENCOUNTER — Other Ambulatory Visit: Payer: Self-pay | Admitting: Hematology & Oncology

## 2024-04-10 DIAGNOSIS — D0510 Intraductal carcinoma in situ of unspecified breast: Secondary | ICD-10-CM

## 2024-04-28 ENCOUNTER — Other Ambulatory Visit: Payer: Self-pay | Admitting: Family Medicine

## 2024-05-08 ENCOUNTER — Encounter: Payer: Self-pay | Admitting: Family Medicine

## 2024-05-14 ENCOUNTER — Other Ambulatory Visit: Payer: Self-pay | Admitting: Surgery

## 2024-05-14 DIAGNOSIS — Z9889 Other specified postprocedural states: Secondary | ICD-10-CM

## 2024-05-22 ENCOUNTER — Other Ambulatory Visit (HOSPITAL_BASED_OUTPATIENT_CLINIC_OR_DEPARTMENT_OTHER): Payer: Self-pay

## 2024-06-04 ENCOUNTER — Ambulatory Visit: Admitting: Hematology & Oncology

## 2024-06-04 ENCOUNTER — Inpatient Hospital Stay

## 2024-06-11 ENCOUNTER — Ambulatory Visit
Admission: RE | Admit: 2024-06-11 | Discharge: 2024-06-11 | Disposition: A | Source: Ambulatory Visit | Attending: Surgery | Admitting: Surgery

## 2024-06-11 DIAGNOSIS — Z9889 Other specified postprocedural states: Secondary | ICD-10-CM

## 2024-06-11 DIAGNOSIS — R928 Other abnormal and inconclusive findings on diagnostic imaging of breast: Secondary | ICD-10-CM | POA: Diagnosis not present

## 2024-06-17 DIAGNOSIS — Z23 Encounter for immunization: Secondary | ICD-10-CM | POA: Diagnosis not present

## 2024-06-25 ENCOUNTER — Other Ambulatory Visit (HOSPITAL_BASED_OUTPATIENT_CLINIC_OR_DEPARTMENT_OTHER): Payer: Self-pay

## 2024-06-25 DIAGNOSIS — H35341 Macular cyst, hole, or pseudohole, right eye: Secondary | ICD-10-CM | POA: Diagnosis not present

## 2024-06-25 DIAGNOSIS — H35373 Puckering of macula, bilateral: Secondary | ICD-10-CM | POA: Diagnosis not present

## 2024-06-25 DIAGNOSIS — H26493 Other secondary cataract, bilateral: Secondary | ICD-10-CM | POA: Diagnosis not present

## 2024-06-25 DIAGNOSIS — H04123 Dry eye syndrome of bilateral lacrimal glands: Secondary | ICD-10-CM | POA: Diagnosis not present

## 2024-07-01 DIAGNOSIS — Z23 Encounter for immunization: Secondary | ICD-10-CM | POA: Diagnosis not present

## 2024-07-02 DIAGNOSIS — N184 Chronic kidney disease, stage 4 (severe): Secondary | ICD-10-CM | POA: Diagnosis not present

## 2024-07-02 DIAGNOSIS — I129 Hypertensive chronic kidney disease with stage 1 through stage 4 chronic kidney disease, or unspecified chronic kidney disease: Secondary | ICD-10-CM | POA: Diagnosis not present

## 2024-07-02 DIAGNOSIS — E039 Hypothyroidism, unspecified: Secondary | ICD-10-CM | POA: Diagnosis not present

## 2024-07-02 DIAGNOSIS — E559 Vitamin D deficiency, unspecified: Secondary | ICD-10-CM | POA: Diagnosis not present

## 2024-07-02 NOTE — Progress Notes (Signed)
 [facility]  New Patient Visit PEARLEAN SABINA DOB: 1951-07-14  MRN: [id number] Visit Date: 07/02/2024  Encounter Provider: Ronal Comer Corona, PA-C  Subjective:   Stephanie Pierce is a 73 y.o. female who presents to establish care in the practice. The patient has a pmhx of hypothyroidism, HTN with CKD stage IV, vitamin D  def.   Issues today include:   She has no acute concerns today.   Review of Systems A complete ROS was performed with pertinent positives/negatives noted in the HPI. The remainder of the ROS are negative.  Assessment/Plan:   1. Benign hypertension with CKD (chronic kidney disease) stage IV (HCC) (Primary) Follows with Nephrology   2. Acquired hypothyroidism Continue with synthroid  100mcg   3. Vitamin D  deficiency She continues on vitamin D  supplement.   4. Encounter for medical examination to establish care Establish Care Visit  She recently had labs all completed at her PCP office w/ [facility] which I reviewed she does not need any more labs today   Return in about 9 months (around 04/01/2025) for Follow-up of chronic conditions, Physical exam.  No orders of the defined types were placed in this encounter.   No orders of the defined types were placed in this encounter.   Objective:  BP 126/74   Pulse 66   Ht 1.651 m (5' 5)   Wt 80.7 kg (178 lb)   SpO2 99%   BMI 29.62 kg/m   Physical Exam Constitutional:      General: She is not in acute distress.    Appearance: Normal appearance. She is not ill-appearing, toxic-appearing or diaphoretic.  HENT:     Head: Normocephalic and atraumatic.     Right Ear: Tympanic membrane, ear canal and external ear normal. There is no impacted cerumen.     Left Ear: Tympanic membrane, ear canal and external ear normal. There is no impacted cerumen.     Nose: No congestion or rhinorrhea.     Mouth/Throat:     Pharynx: Oropharynx is clear. No oropharyngeal exudate or  posterior oropharyngeal erythema.  Eyes:     General: No scleral icterus.       Right eye: No discharge.        Left eye: No discharge.     Pupils: Pupils are equal, round, and reactive to light.  Neck:     Vascular: No carotid bruit.  Cardiovascular:     Rate and Rhythm: Normal rate and regular rhythm.     Pulses: Normal pulses.     Heart sounds: Normal heart sounds.  Pulmonary:     Effort: Pulmonary effort is normal. No respiratory distress.     Breath sounds: Normal breath sounds. No stridor. No wheezing, rhonchi or rales.  Chest:     Chest wall: No tenderness.  Musculoskeletal:     Cervical back: Normal range of motion. No rigidity or tenderness.  Lymphadenopathy:     Cervical: No cervical adenopathy.  Skin:    General: Skin is warm and dry.  Neurological:     General: No focal deficit present.     Mental Status: She is alert and oriented to person, place, and time.  Psychiatric:        Mood and Affect: Mood normal.        Behavior: Behavior normal.        Thought Content: Thought content normal.        Judgment: Judgment normal.     Patient Active Problem  List   Diagnosis Date Noted   . Benign hypertension with CKD (chronic kidney disease) stage IV (HCC) 04/25/2022  . Metabolic bone disease 04/25/2022  . Vitamin D  deficiency 04/25/2022  . Persistent proteinuria 04/25/2022  . 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 Tinnitus, bilateral 04/24/2018  . Conductive hearing loss of both ears 04/24/2018  . Eustachian tube dysfunction, bilateral 04/24/2018    Resolved Problems  No resolved problems to display.    Medical History[1]  Surgical History[2]  Current Outpatient Medications  Medication Instructions  . calcium  citrate 500 mg, Daily with breakfast  . cholecalciferol  (VITAMIN D3) 5,000 Units, Daily  . cyanocobalamin  (VITAMIN B12) 2,500 mcg, Weekly  . iron  fum,ps-FA-vit B,C#18-Lact (Fusion Plus) 130 mg iron  -1,250 mcg cap  1 tablet, Daily  . krill oil 500 mg, Daily  . letrozole  (FEMARA ) 2.5 mg, Daily  . losartan  (COZAAR ) 50 mg, oral, 2 times daily  . Synthroid  100 mcg, Daily    Allergies[3]  Social History[4]  Social History   Social History Narrative  . Not on file    Family History[5]    Labs: No results found for this or any previous visit (from the past week).  Electronically signed by: Ronal Comer Corona, PA-C 07/02/2024 9:05 AM        [1] Past Medical History: Diagnosis Date  . Allergic Child   Seasonal  . Anemia    In [employer]  . Arthritis   . Cancer    (CMD) 2012   Lumpectomy stage 0  . Cataract    Removed  . Chronic kidney disease 2012  . Disease of thyroid  gland 1993   Hypothyroid  . Hypertension   . Pneumonia    2012  . Varicella    Childhoid  . Visual impairment    Childhood  [2] Past Surgical History: Procedure Laterality Date  . BREAST CYST EXCISION Left    Procedure: BREAST CYST EXCISION  . CESAREAN SECTION, UNSPECIFIED     Procedure: CESAREAN SECTION  . EYE SURGERY  2023   Cataract removal OU  . GASTRIC RESTRICTION SURGERY     Procedure: GASTRIC RESTRICTION SURGERY; GASTRIC SLEEVE  [3] Allergies Allergen Reactions  . Cephalexin Other (See Comments)  . Iodine Hives  . Lisinopril Swelling    Swelling in the throat  . Penicillins Rash  [4] Social History Tobacco Use  . Smoking status: Never    Passive exposure: Past  . Smokeless tobacco: Never  Substance Use Topics  . Alcohol use: Never  . Drug use: Never  [5] Family History Problem Relation Name Age of Onset  . Hypertension Father Sister Not my Farther   . Cancer Sister  36 - 8       Breast Did not get treated  . Diabetes Sister    . Diabetes Brother         From [employer] in [place]

## 2024-07-04 ENCOUNTER — Encounter: Payer: Self-pay | Admitting: Student

## 2024-07-09 ENCOUNTER — Encounter: Payer: Self-pay | Admitting: Hematology & Oncology

## 2024-07-09 ENCOUNTER — Ambulatory Visit: Payer: Self-pay | Admitting: Hematology & Oncology

## 2024-07-09 ENCOUNTER — Inpatient Hospital Stay (HOSPITAL_BASED_OUTPATIENT_CLINIC_OR_DEPARTMENT_OTHER): Admitting: Hematology & Oncology

## 2024-07-09 ENCOUNTER — Inpatient Hospital Stay: Attending: Hematology & Oncology

## 2024-07-09 VITALS — BP 141/85 | HR 77 | Temp 97.9°F | Resp 20 | Ht 67.0 in | Wt 178.1 lb

## 2024-07-09 DIAGNOSIS — E538 Deficiency of other specified B group vitamins: Secondary | ICD-10-CM | POA: Diagnosis not present

## 2024-07-09 DIAGNOSIS — Z79811 Long term (current) use of aromatase inhibitors: Secondary | ICD-10-CM | POA: Insufficient documentation

## 2024-07-09 DIAGNOSIS — E782 Mixed hyperlipidemia: Secondary | ICD-10-CM

## 2024-07-09 DIAGNOSIS — Z923 Personal history of irradiation: Secondary | ICD-10-CM | POA: Insufficient documentation

## 2024-07-09 DIAGNOSIS — R918 Other nonspecific abnormal finding of lung field: Secondary | ICD-10-CM | POA: Diagnosis not present

## 2024-07-09 DIAGNOSIS — D0511 Intraductal carcinoma in situ of right breast: Secondary | ICD-10-CM | POA: Insufficient documentation

## 2024-07-09 DIAGNOSIS — N289 Disorder of kidney and ureter, unspecified: Secondary | ICD-10-CM | POA: Insufficient documentation

## 2024-07-09 DIAGNOSIS — D649 Anemia, unspecified: Secondary | ICD-10-CM

## 2024-07-09 DIAGNOSIS — I129 Hypertensive chronic kidney disease with stage 1 through stage 4 chronic kidney disease, or unspecified chronic kidney disease: Secondary | ICD-10-CM | POA: Diagnosis not present

## 2024-07-09 DIAGNOSIS — Z862 Personal history of diseases of the blood and blood-forming organs and certain disorders involving the immune mechanism: Secondary | ICD-10-CM

## 2024-07-09 DIAGNOSIS — N184 Chronic kidney disease, stage 4 (severe): Secondary | ICD-10-CM | POA: Diagnosis not present

## 2024-07-09 LAB — CMP (CANCER CENTER ONLY)
ALT: 20 U/L (ref 0–44)
AST: 30 U/L (ref 15–41)
Albumin: 4.3 g/dL (ref 3.5–5.0)
Alkaline Phosphatase: 85 U/L (ref 38–126)
Anion gap: 11 (ref 5–15)
BUN: 43 mg/dL — ABNORMAL HIGH (ref 8–23)
CO2: 26 mmol/L (ref 22–32)
Calcium: 10.1 mg/dL (ref 8.9–10.3)
Chloride: 102 mmol/L (ref 98–111)
Creatinine: 2.25 mg/dL — ABNORMAL HIGH (ref 0.44–1.00)
GFR, Estimated: 23 mL/min — ABNORMAL LOW (ref >60)
Glucose, Bld: 96 mg/dL (ref 70–99)
Potassium: 4.2 mmol/L (ref 3.5–5.1)
Sodium: 139 mmol/L (ref 135–145)
Total Bilirubin: 0.7 mg/dL (ref 0.0–1.2)
Total Protein: 8.2 g/dL — ABNORMAL HIGH (ref 6.5–8.1)

## 2024-07-09 LAB — RETICULOCYTES
Immature Retic Fract: 5.4 % (ref 2.3–15.9)
RBC.: 4.23 MIL/uL (ref 3.87–5.11)
Retic Count, Absolute: 30.5 K/uL (ref 19.0–186.0)
Retic Ct Pct: 0.7 % (ref 0.4–3.1)

## 2024-07-09 LAB — IRON AND IRON BINDING CAPACITY (CC-WL,HP ONLY)
Iron: 63 ug/dL (ref 28–170)
Saturation Ratios: 20 % (ref 10.4–31.8)
TIBC: 309 ug/dL (ref 250–450)
UIBC: 246 ug/dL

## 2024-07-09 LAB — CBC WITH DIFFERENTIAL (CANCER CENTER ONLY)
Abs Immature Granulocytes: 0.01 K/uL (ref 0.00–0.07)
Basophils Absolute: 0.1 K/uL (ref 0.0–0.1)
Basophils Relative: 1 %
Eosinophils Absolute: 0.2 K/uL (ref 0.0–0.5)
Eosinophils Relative: 3 %
HCT: 35.8 % — ABNORMAL LOW (ref 36.0–46.0)
Hemoglobin: 11.4 g/dL — ABNORMAL LOW (ref 12.0–15.0)
Immature Granulocytes: 0 %
Lymphocytes Relative: 32 %
Lymphs Abs: 1.8 K/uL (ref 0.7–4.0)
MCH: 27.5 pg (ref 26.0–34.0)
MCHC: 31.8 g/dL (ref 30.0–36.0)
MCV: 86.3 fL (ref 80.0–100.0)
Monocytes Absolute: 0.6 K/uL (ref 0.1–1.0)
Monocytes Relative: 10 %
Neutro Abs: 3.1 K/uL (ref 1.7–7.7)
Neutrophils Relative %: 54 %
Platelet Count: 191 K/uL (ref 150–400)
RBC: 4.15 MIL/uL (ref 3.87–5.11)
RDW: 13.7 % (ref 11.5–15.5)
WBC Count: 5.7 K/uL (ref 4.0–10.5)
nRBC: 0 % (ref 0.0–0.2)

## 2024-07-09 LAB — FERRITIN: Ferritin: 163 ng/mL (ref 11–307)

## 2024-07-09 LAB — VITAMIN B12: Vitamin B-12: 572 pg/mL (ref 180–914)

## 2024-07-09 NOTE — Progress Notes (Signed)
 BP 141/85, patient monitors daily and it run WNR.

## 2024-07-09 NOTE — Progress Notes (Signed)
 Hematology and Oncology Follow Up Visit  Stephanie Pierce Pierce [id number] [birth year] 73 y.o. 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 daily -to complete therapy in 2027 Fusion Plus 1 p.o. daily   Interim History: Stephanie Pierce Pierce is here today for follow-up.  We see her every 6 months.  Thankfully, her husband is doing a lot better now.  He has had a slow recovery from having a meningioma resected over a year ago.  She is still working.  She is quite busy at work.  She is a Designer, Television/film Set.  We thanked her for her service for [employer] yesterday.  She has had no problems with nausea or vomiting.  She had a little bit of constipation.  She has had no leg swelling.  She is trying to stay active by exercising.  She has had no cough.  She is taking the oral iron .  When we last saw her, her ferritin was 156 and iron  saturation of 18%.  She had a mammogram on 06/11/2024.  The mammogram looks fantastic.  Overall, I will say that her performance status is ECOG 0.     Medications:  Allergies as of 07/09/2024       Reactions   Lisinopril Swelling   Swelling in the throat   Iodine Hives   Cephalexin Other (See Comments)   Penicillins Rash        Medication List        Accurate as of July 09, 2024  9:15 AM. If you have any questions, ask your nurse or doctor.          STOP taking these medications    CENTRUM SILVER ADULT 50+ PO Stopped by: Stephanie Pierce Pierce       TAKE these medications    B-12 2500 MCG Tabs Take 1 tablet by mouth once a week.   Cholecalciferol  125 MCG (5000 UT) Tabs Take by mouth daily.   fluticasone  50 MCG/ACT nasal spray Commonly known as: FLONASE  Place 2 sprays into both nostrils daily.   Fusion Plus Caps Take 1 capsule by mouth daily.   KRILL OIL OMEGA-3 PO Take by mouth  daily.   letrozole  2.5 MG tablet Commonly known as: FEMARA  TAKE 1 TABLET DAILY   losartan  50 MG tablet Commonly known as: COZAAR  Take 50 mg by mouth daily.   OVER THE COUNTER MEDICATION Take 1 tablet by mouth daily. CALCIUM  650MG  / VIT D3   Synthroid  100 MCG tablet Generic drug: levothyroxine  TAKE 1 TABLET DAILY BEFORE BREAKFAST        Allergies:  Allergies  Allergen Reactions   Lisinopril Swelling    Swelling in the throat   Iodine Hives   Cephalexin Other (See Comments)   Penicillins Rash    Past Medical History, Surgical history, Social history, and Family History were reviewed and updated.  Review of Systems: Review of Systems  Constitutional: Negative.   HENT: Negative.    Eyes: Negative.   Respiratory: Negative.    Cardiovascular: Negative.   Gastrointestinal: Negative.   Genitourinary: Negative.   Musculoskeletal: Negative.   Skin: Negative.   Neurological: Negative.   Endo/Heme/Allergies: Negative.   Psychiatric/Behavioral: Negative.       Physical Exam: Her vital signs are temperature of 98.3.  Pulse 75.  Blood pressure 147/81.  Weight is 178 pounds.  Wt Readings from Last 3 Encounters:  07/09/24 178 lb 1.9 oz (80.8 kg)  04/02/24 187 lb 6.4 oz (85 kg)  02/27/24 196 lb (88.9 kg)    Physical Exam Vitals reviewed.  Constitutional:      Comments: Her breast exam shows the right breast with the lumpectomy that is well-healed.  This is about the 10 o'clock position.  No obvious nodularity is noted in the right breast.  There is no right axillary adenopathy.  Left breast is unremarkable.  There is no left axillary adenopathy.  HENT:     Head: Normocephalic and atraumatic.  Eyes:     Pupils: Pupils are equal, round, and reactive to light.  Cardiovascular:     Rate and Rhythm: Normal rate and regular rhythm.     Heart sounds: Normal heart sounds.  Pulmonary:     Effort: Pulmonary effort is normal.     Breath sounds: Normal breath sounds.   Abdominal:     General: Bowel sounds are normal.     Palpations: Abdomen is soft.  Musculoskeletal:        General: No tenderness or deformity. Normal range of motion.     Cervical back: Normal range of motion.  Lymphadenopathy:     Cervical: No cervical adenopathy.  Skin:    General: Skin is warm and dry.     Findings: No erythema or rash.  Neurological:     Mental Status: She is alert and oriented to person, place, and time.  Psychiatric:        Behavior: Behavior normal.        Thought Content: Thought content normal.        Judgment: Judgment normal.      Lab Results  Component Value Date   WBC 5.7 07/09/2024   HGB 11.4 (L) 07/09/2024   HCT 35.8 (L) 07/09/2024   MCV 86.3 07/09/2024   PLT 191 07/09/2024   Lab Results  Component Value Date   FERRITIN 156 12/05/2023   IRON  60 12/05/2023   TIBC 336 12/05/2023   UIBC 276 12/05/2023   IRONPCTSAT 18 12/05/2023   Lab Results  Component Value Date   RETICCTPCT 0.7 07/09/2024   RBC 4.23 07/09/2024   RBC 4.15 07/09/2024   No results found for: KPAFRELGTCHN, LAMBDASER, KAPLAMBRATIO No results found for: IGGSERUM, IGA, IGMSERUM No results found for: STEPHANY CARLOTA BENSON MARKEL EARLA JOANNIE DOC VICK, SPEI   Chemistry      Component Value Date/Time   NA 139 07/09/2024 0827   NA 141 05/31/2021 0000   NA 147 (H) 06/08/2017 0748   NA 140 12/07/2016 0836   K 4.2 07/09/2024 0827   K 4.1 06/08/2017 0748   K 4.5 12/07/2016 0836   CL 102 07/09/2024 0827   CL 109 (H) 06/08/2017 0748   CO2 26 07/09/2024 0827   CO2 29 06/08/2017 0748   CO2 28 12/07/2016 0836   BUN 43 (H) 07/09/2024 0827   BUN 20 05/31/2021 0000   BUN 39 (H) 06/08/2017 0748   BUN 34.7 (H) 12/07/2016 0836   CREATININE 2.25 (H) 07/09/2024 0827   CREATININE 2.1 (H) 06/08/2017 0748   CREATININE 2.2 (H) 12/07/2016 0836   GLU 93 05/31/2021 0000      Component Value Date/Time   CALCIUM  10.1 07/09/2024 0827    CALCIUM  9.3 06/08/2017 0748   CALCIUM  9.3 12/07/2016 0836   ALKPHOS 85 07/09/2024 0827   ALKPHOS 69 06/08/2017 0748   ALKPHOS 73 12/07/2016 0836  AST 30 07/09/2024 0827   AST 23 12/07/2016 0836   ALT 20 07/09/2024 0827   ALT 27 06/08/2017 0748   ALT 21 12/07/2016 0836   BILITOT 0.7 07/09/2024 0827   BILITOT 0.55 12/07/2016 0836     Impression and Plan: Stephanie Pierce Pierce is a 29 yo African American female with DCIS of the right breast diagnosed in July 2015 with lumpectomy.  She also had radiation therapy after the lumpectomy.    She is doing well on the Femara .  We will keep her on Femara  for right now.  I think that we can probably keep her on Femara  for another  year.  I am happy that her anemia is improved.  She does have mild renal insufficiency.  We do have to be careful with this.  Again, the oral iron  seems to be helping her.  Will plan to get her back in 6 months.   Stephanie Pierce JONELLE Crease, MD 11/12/20259:15 AM

## 2024-07-16 DIAGNOSIS — E559 Vitamin D deficiency, unspecified: Secondary | ICD-10-CM | POA: Diagnosis not present

## 2024-07-16 DIAGNOSIS — N1419 Nephropathy induced by other drugs, medicaments and biological substances: Secondary | ICD-10-CM | POA: Diagnosis not present

## 2024-07-16 DIAGNOSIS — M898X9 Other specified disorders of bone, unspecified site: Secondary | ICD-10-CM | POA: Diagnosis not present

## 2024-07-16 DIAGNOSIS — I129 Hypertensive chronic kidney disease with stage 1 through stage 4 chronic kidney disease, or unspecified chronic kidney disease: Secondary | ICD-10-CM | POA: Diagnosis not present

## 2024-07-16 DIAGNOSIS — I7782 Antineutrophilic cytoplasmic antibody (ANCA) vasculitis: Secondary | ICD-10-CM | POA: Diagnosis not present

## 2024-07-16 DIAGNOSIS — T39395A Adverse effect of other nonsteroidal anti-inflammatory drugs [NSAID], initial encounter: Secondary | ICD-10-CM | POA: Diagnosis not present

## 2024-07-16 DIAGNOSIS — R801 Persistent proteinuria, unspecified: Secondary | ICD-10-CM | POA: Diagnosis not present

## 2024-07-16 DIAGNOSIS — N184 Chronic kidney disease, stage 4 (severe): Secondary | ICD-10-CM | POA: Diagnosis not present

## 2024-09-23 ENCOUNTER — Other Ambulatory Visit (HOSPITAL_BASED_OUTPATIENT_CLINIC_OR_DEPARTMENT_OTHER): Payer: Self-pay

## 2024-09-23 ENCOUNTER — Other Ambulatory Visit: Payer: Self-pay | Admitting: Hematology & Oncology

## 2024-09-23 DIAGNOSIS — Z862 Personal history of diseases of the blood and blood-forming organs and certain disorders involving the immune mechanism: Secondary | ICD-10-CM

## 2024-09-23 MED ORDER — FUSION PLUS PO CAPS
1.0000 | ORAL_CAPSULE | Freq: Every day | ORAL | 3 refills | Status: AC
  Filled 2024-09-23 – 2024-09-24 (×2): qty 60, 60d supply, fill #0

## 2024-09-24 ENCOUNTER — Other Ambulatory Visit (HOSPITAL_COMMUNITY): Payer: Self-pay

## 2024-09-24 ENCOUNTER — Other Ambulatory Visit: Payer: Self-pay

## 2024-09-24 ENCOUNTER — Other Ambulatory Visit (HOSPITAL_BASED_OUTPATIENT_CLINIC_OR_DEPARTMENT_OTHER): Payer: Self-pay

## 2025-01-21 ENCOUNTER — Inpatient Hospital Stay

## 2025-01-21 ENCOUNTER — Inpatient Hospital Stay: Admitting: Hematology & Oncology

## 2025-03-04 ENCOUNTER — Ambulatory Visit
